# Patient Record
Sex: Male | Born: 1940 | Race: White | Hispanic: No | Marital: Married | Smoking: Never smoker
Health system: Southern US, Community
[De-identification: ages and names within clinical notes are randomized; demographics above are authoritative.]

## PROBLEM LIST (undated history)

## (undated) DIAGNOSIS — T8859XA Other complications of anesthesia, initial encounter: Secondary | ICD-10-CM

## (undated) DIAGNOSIS — I2699 Other pulmonary embolism without acute cor pulmonale: Secondary | ICD-10-CM

## (undated) DIAGNOSIS — R42 Dizziness and giddiness: Secondary | ICD-10-CM

## (undated) DIAGNOSIS — Z87442 Personal history of urinary calculi: Secondary | ICD-10-CM

## (undated) DIAGNOSIS — R519 Headache, unspecified: Secondary | ICD-10-CM

## (undated) DIAGNOSIS — E119 Type 2 diabetes mellitus without complications: Secondary | ICD-10-CM

## (undated) DIAGNOSIS — M1990 Unspecified osteoarthritis, unspecified site: Secondary | ICD-10-CM

## (undated) DIAGNOSIS — F32A Depression, unspecified: Secondary | ICD-10-CM

## (undated) HISTORY — PX: FOOT SURGERY: SHX648

## (undated) HISTORY — PX: ADENOIDECTOMY: SUR15

## (undated) HISTORY — PX: SHOULDER SURGERY: SHX246

## (undated) HISTORY — PX: TONSILLECTOMY: SUR1361

## (undated) HISTORY — PX: FINGER SURGERY: SHX640

## (undated) HISTORY — PX: LITHOTRIPSY: SUR834

## (undated) HISTORY — PX: OTHER SURGICAL HISTORY: SHX169

---

## 2020-04-24 ENCOUNTER — Other Ambulatory Visit: Payer: Self-pay

## 2020-04-24 ENCOUNTER — Encounter: Payer: Self-pay | Admitting: Nurse Practitioner

## 2020-04-24 ENCOUNTER — Non-Acute Institutional Stay: Payer: PRIVATE HEALTH INSURANCE | Admitting: Nurse Practitioner

## 2020-04-24 DIAGNOSIS — E119 Type 2 diabetes mellitus without complications: Secondary | ICD-10-CM

## 2020-04-24 DIAGNOSIS — Z981 Arthrodesis status: Secondary | ICD-10-CM

## 2020-04-24 DIAGNOSIS — E782 Mixed hyperlipidemia: Secondary | ICD-10-CM

## 2020-04-24 DIAGNOSIS — Z86711 Personal history of pulmonary embolism: Secondary | ICD-10-CM

## 2020-04-24 DIAGNOSIS — N400 Enlarged prostate without lower urinary tract symptoms: Secondary | ICD-10-CM | POA: Insufficient documentation

## 2020-04-24 DIAGNOSIS — E1140 Type 2 diabetes mellitus with diabetic neuropathy, unspecified: Secondary | ICD-10-CM

## 2020-04-24 DIAGNOSIS — H8113 Benign paroxysmal vertigo, bilateral: Secondary | ICD-10-CM

## 2020-04-24 DIAGNOSIS — Z96611 Presence of right artificial shoulder joint: Secondary | ICD-10-CM

## 2020-04-24 DIAGNOSIS — E785 Hyperlipidemia, unspecified: Secondary | ICD-10-CM | POA: Insufficient documentation

## 2020-04-24 DIAGNOSIS — K5901 Slow transit constipation: Secondary | ICD-10-CM | POA: Diagnosis not present

## 2020-04-24 DIAGNOSIS — J309 Allergic rhinitis, unspecified: Secondary | ICD-10-CM

## 2020-04-24 DIAGNOSIS — N200 Calculus of kidney: Secondary | ICD-10-CM

## 2020-04-24 DIAGNOSIS — Z794 Long term (current) use of insulin: Secondary | ICD-10-CM

## 2020-04-24 MED ORDER — FINASTERIDE 5 MG PO TABS: 5.0000 mg | ORAL_TABLET | Freq: Every day | ORAL | 1 refills | Status: DC

## 2020-04-24 MED ORDER — ATORVASTATIN CALCIUM 40 MG PO TABS: 40.0000 mg | ORAL_TABLET | Freq: Every day | ORAL | 1 refills | Status: DC

## 2020-04-24 MED ORDER — CETIRIZINE HCL 10 MG PO TABS: 10.0000 mg | ORAL_TABLET | Freq: Every day | ORAL | 1 refills | Status: DC

## 2020-04-24 MED ORDER — GABAPENTIN 300 MG PO CAPS: 300.0000 mg | ORAL_CAPSULE | Freq: Three times a day (TID) | ORAL | 1 refills | Status: DC

## 2020-04-24 MED ORDER — DULOXETINE HCL 30 MG PO CPEP: 30.0000 mg | ORAL_CAPSULE | Freq: Two times a day (BID) | ORAL | 1 refills | Status: DC

## 2020-04-24 MED ORDER — TAMSULOSIN HCL 0.4 MG PO CAPS: 0.4000 mg | ORAL_CAPSULE | Freq: Every day | ORAL | 1 refills | Status: DC

## 2020-04-24 MED ORDER — CARBIDOPA-LEVODOPA ER 50-200 MG PO TBCR: 1.0000 | EXTENDED_RELEASE_TABLET | Freq: Two times a day (BID) | ORAL | 1 refills | Status: DC

## 2020-04-24 NOTE — Assessment & Plan Note (Signed)
Uses Zyrtec

## 2020-04-24 NOTE — Assessment & Plan Note (Signed)
Underwent neurology evaluation, not new, resolved after therapy last Nov.

## 2020-04-24 NOTE — Assessment & Plan Note (Addendum)
7 years ago, ambulating okay, on and off pain in both legs, spastic nature,  usually around 3 am, takes Sinemet, Gabapentin.

## 2020-04-24 NOTE — Assessment & Plan Note (Signed)
Occasionally hematuria.

## 2020-04-24 NOTE — Assessment & Plan Note (Signed)
Complicated with PE

## 2020-04-24 NOTE — Assessment & Plan Note (Signed)
Prn MiraLax

## 2020-04-24 NOTE — Assessment & Plan Note (Signed)
Takes Atorvastain

## 2020-04-24 NOTE — Assessment & Plan Note (Signed)
Complicated after the right shoulder replacement, treated with oral anticoagulation therapy.

## 2020-04-24 NOTE — Assessment & Plan Note (Addendum)
Takes Tamsulosin, finasterides, desires PSA

## 2020-04-24 NOTE — Progress Notes (Signed)
Location:   clinic [place]   Place of Service:  Clinic (12) Provider: Marlana Latus NP  Code Status: DNR Goals of Care: No flowsheet data found.   Chief Complaint  Patient presents with  . Establish Care    Patient here today to establish care. New to [place].    HPI: Patient is a 80 y.o. male seen today for medical management of chronic diseases.      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   History reviewed. No pertinent past medical history.  History reviewed. No pertinent surgical history.  Not on File  Allergies as of 04/24/2020   Not on File     Medication List       Accurate as of April 24, 2020 11:59 PM. If you have any questions, ask your nurse or doctor.        atorvastatin 40 MG tablet Commonly known as: LIPITOR Take 1 tablet (40 mg total) by mouth daily.   B-D ULTRAFINE III SHORT PEN 31G X 8 MM Misc Generic drug: Insulin Pen Needle SMARTSIG:1 Each SUB-Q Daily   Basaglar KwikPen 100 UNIT/ML Inject 35 Units into the skin daily.   carbidopa-levodopa 50-200 MG tablet Commonly known as: SINEMET CR Take 1 tablet by mouth 2 (two) times daily.   cetirizine 10 MG tablet Commonly known as: Allergy (Cetirizine) Take 1 tablet (10 mg total) by mouth daily.   DULoxetine 30 MG capsule Commonly known as: CYMBALTA Take 1 capsule  (30 mg total) by mouth 2 (two) times daily.   finasteride 5 MG tablet Commonly known as: PROSCAR Take 1 tablet (5 mg total) by mouth daily.   gabapentin 300 MG capsule Commonly known as: NEURONTIN Take 1 capsule (300 mg total) by mouth 3 (three) times daily.   MULTIPLE MINERALS PO Take 1 tablet by mouth daily.   polyethylene glycol 17 g packet Commonly known as: MIRALAX / GLYCOLAX Take 17 g by mouth daily as needed.   tamsulosin 0.4 MG Caps capsule Commonly known as: FLOMAX Take 1 capsule (0.4 mg total) by mouth daily.       Review of Systems:  Review of Systems  Constitutional: Negative for activity change, fatigue and fever.  HENT: Positive for hearing loss. Negative for congestion, trouble swallowing and voice change.   Eyes: Negative for visual disturbance.  Respiratory: Negative for cough, shortness of breath and wheezing.   Cardiovascular: Negative for chest pain, palpitations and leg swelling.  Gastrointestinal: Negative for abdominal pain, constipation, nausea and vomiting.  Genitourinary: Positive for frequency. Negative for difficulty urinating and urgency.       4x/night  Musculoskeletal: Positive for arthralgias and gait problem.  Skin: Negative for color change.  Neurological: Positive for numbness. Negative for tremors, speech difficulty, weakness and headaches.  Psychiatric/Behavioral: Positive for sleep disturbance. Negative for confusion. The patient is not nervous/anxious.     Health Maintenance  Topic  Date Due  . HEMOGLOBIN A1C  Never done  . Hepatitis C Screening  Never done  . COVID-19 Vaccine (1) Never done  . FOOT EXAM  Never done  . OPHTHALMOLOGY EXAM  Never done  . URINE MICROALBUMIN  Never done  . PNA vac Low Risk Adult (1 of 2 - PCV13) Never done  . TETANUS/TDAP  06/10/2011  . INFLUENZA VACCINE  09/09/2019  . HPV VACCINES  Aged Out    Physical Exam: Vitals:   04/24/20 1506  BP: 136/66  Pulse: 80  Temp: (!) 97.3 F (36.3 C)  SpO2:  97%  Weight: 199 lb 12.8 oz (90.6 kg)  Height: 6' (1.829 m)   Body mass index is 27.1 kg/m. Physical Exam Vitals and nursing note reviewed.  Constitutional:      General: He is not in acute distress.    Appearance: Normal appearance. He is not ill-appearing, toxic-appearing or diaphoretic.  HENT:     Head: Normocephalic and atraumatic.     Nose: Nose normal.     Mouth/Throat:     Mouth: Mucous membranes are moist.  Eyes:     Extraocular Movements: Extraocular movements intact.     Conjunctiva/sclera: Conjunctivae normal.     Pupils: Pupils are equal, round, and reactive to light.  Cardiovascular:     Rate and Rhythm: Normal rate and regular rhythm.     Heart sounds: No murmur heard.     Comments: Hx of PAC Pulmonary:     Breath sounds: No wheezing, rhonchi or rales.  Abdominal:     General: Bowel sounds are normal.     Palpations: Abdomen is soft.     Tenderness: There is no abdominal tenderness. There is no right CVA tenderness, left CVA tenderness, guarding or rebound.  Musculoskeletal:     Cervical back: Normal range of motion and neck supple.     Right lower leg: No edema.     Left lower leg: No edema.     Comments: Right shoulder discomfort with ROM, not strong as the left. Left should discomfort just started.   Skin:    General: Skin is warm and dry.  Neurological:     General: No focal deficit present.     Mental Status: He is alert and oriented to person, place, and time. Mental status is at baseline.  Psychiatric:        Mood and Affect: Mood normal.        Behavior: Behavior normal.        Thought Content: Thought content normal.        Judgment: Judgment normal.     Labs reviewed: Basic Metabolic Panel: No results for input(s): NA, K, CL, CO2, GLUCOSE, BUN, CREATININE, CALCIUM, MG, PHOS, TSH in the last 8760 hours. Liver Function Tests: No results for input(s): AST, ALT, ALKPHOS, BILITOT, PROT, ALBUMIN in the last 8760 hours. No results for input(s):  LIPASE, AMYLASE in the last 8760 hours. No results for input(s): AMMONIA in the last 8760 hours. CBC: No results for input(s): WBC, NEUTROABS, HGB, HCT, MCV, PLT in the last 8760 hours. Lipid Panel: No results for input(s): CHOL, HDL, LDLCALC, TRIG, CHOLHDL, LDLDIRECT in the last 8760 hours. No results found for: HGBA1C  Procedures since last visit: No results found.  Assessment/Plan  Insulin dependent type 2 diabetes mellitus (HCC) Insulin, CBG am and pm  History of lumbar spinal fusion 7 years ago, ambulating okay, on and off pain in both legs, spastic nature,  usually  around 3 am, takes Sinemet, Gabapentin.   BPH (benign prostatic hyperplasia) Takes Tamsulosin, finasterides, desires PSA  Slow transit constipation Prn MiraLax   Right renal stone Occasionally hematuria.   Type 2 diabetes mellitus with diabetic neuropathy, unspecified (HCC) Pain, tingling, numbness in toes, takes Gabapentin, Cymbalta.   Hyperlipidemia Takes Atorvastain  BPPV (benign paroxysmal positional vertigo), bilateral Underwent neurology evaluation, not new, resolved after therapy last Nov.   S/P shoulder replacement, right Complicated with PE  History of pulmonary embolism Complicated after the right shoulder replacement, treated with oral anticoagulation therapy.   Allergic rhinitis Uses Zyrtec   Labs/tests ordered: CBC/diff, CMP/eGFR, TSH, lipid panel, PSA, TSH  Next appt:  4 weeks

## 2020-04-24 NOTE — Assessment & Plan Note (Addendum)
Insulin, CBG am and pm

## 2020-04-24 NOTE — Assessment & Plan Note (Addendum)
Pain, tingling, numbness in toes, takes Gabapentin, Cymbalta.

## 2020-04-25 ENCOUNTER — Encounter: Payer: Self-pay | Admitting: Nurse Practitioner

## 2020-05-01 ENCOUNTER — Other Ambulatory Visit: Payer: Self-pay

## 2020-05-01 DIAGNOSIS — N400 Enlarged prostate without lower urinary tract symptoms: Secondary | ICD-10-CM

## 2020-05-01 DIAGNOSIS — E782 Mixed hyperlipidemia: Secondary | ICD-10-CM

## 2020-05-01 DIAGNOSIS — E119 Type 2 diabetes mellitus without complications: Secondary | ICD-10-CM

## 2020-05-02 LAB — COMPLETE METABOLIC PANEL WITH GFR
AG Ratio: 1.8 (calc) (ref 1.0–2.5)
ALT: 16 U/L (ref 9–46)
AST: 19 U/L (ref 10–35)
Albumin: 4.2 g/dL (ref 3.6–5.1)
Alkaline phosphatase (APISO): 78 U/L (ref 35–144)
BUN: 20 mg/dL (ref 7–25)
CO2: 27 mmol/L (ref 20–32)
Calcium: 9.1 mg/dL (ref 8.6–10.3)
Chloride: 105 mmol/L (ref 98–110)
Creat: 0.84 mg/dL (ref 0.70–1.18)
GFR, Est African American: 97 mL/min/{1.73_m2} (ref >=60)
GFR, Est Non African American: 84 mL/min/{1.73_m2} (ref >=60)
Globulin: 2.4 g/dL (calc) (ref 1.9–3.7)
Glucose, Bld: 92 mg/dL (ref 65–99)
Potassium: 4.4 mmol/L (ref 3.5–5.3)
Sodium: 139 mmol/L (ref 135–146)
Total Bilirubin: 0.7 mg/dL (ref 0.2–1.2)
Total Protein: 6.6 g/dL (ref 6.1–8.1)

## 2020-05-02 LAB — CBC WITH DIFFERENTIAL/PLATELET
Absolute Monocytes: 526 cells/uL (ref 200–950)
Basophils Absolute: 28 cells/uL (ref 0–200)
Basophils Relative: 0.6 %
Eosinophils Absolute: 132 cells/uL (ref 15–500)
Eosinophils Relative: 2.8 %
HCT: 44.8 % (ref 38.5–50.0)
Hemoglobin: 15.1 g/dL (ref 13.2–17.1)
Lymphs Abs: 1222 cells/uL (ref 850–3900)
MCH: 31.7 pg (ref 27.0–33.0)
MCHC: 33.7 g/dL (ref 32.0–36.0)
MCV: 93.9 fL (ref 80.0–100.0)
MPV: 10.1 fL (ref 7.5–12.5)
Monocytes Relative: 11.2 %
Neutro Abs: 2792 cells/uL (ref 1500–7800)
Neutrophils Relative %: 59.4 %
Platelets: 237 10*3/uL (ref 140–400)
RBC: 4.77 10*6/uL (ref 4.20–5.80)
RDW: 12.4 % (ref 11.0–15.0)
Total Lymphocyte: 26 %
WBC: 4.7 10*3/uL (ref 3.8–10.8)

## 2020-05-02 LAB — PSA: PSA: 2.69 ng/mL (ref <=4.0)

## 2020-05-02 LAB — LIPID PANEL
Cholesterol: 116 mg/dL (ref <200)
HDL: 43 mg/dL (ref >=40)
LDL Cholesterol (Calc): 57 mg/dL (calc)
Non-HDL Cholesterol (Calc): 73 mg/dL (calc) (ref <130)
Total CHOL/HDL Ratio: 2.7 (calc) (ref <5.0)
Triglycerides: 78 mg/dL (ref <150)

## 2020-05-02 LAB — HEMOGLOBIN A1C
Hgb A1c MFr Bld: 6 % of total Hgb — ABNORMAL HIGH (ref <5.7)
Mean Plasma Glucose: 126 mg/dL
eAG (mmol/L): 7 mmol/L

## 2020-05-02 LAB — TSH: TSH: 1.42 mIU/L (ref 0.40–4.50)

## 2020-05-28 ENCOUNTER — Encounter: Payer: Self-pay | Admitting: Family Medicine

## 2020-05-28 ENCOUNTER — Other Ambulatory Visit: Payer: Self-pay

## 2020-05-28 ENCOUNTER — Non-Acute Institutional Stay: Payer: Medicare Other | Admitting: Family Medicine

## 2020-05-28 VITALS — BP 118/60 | HR 71 | Temp 96.9°F | Resp 16 | Ht 72.0 in | Wt 201.2 lb

## 2020-05-28 DIAGNOSIS — Z794 Long term (current) use of insulin: Secondary | ICD-10-CM | POA: Diagnosis not present

## 2020-05-28 DIAGNOSIS — N400 Enlarged prostate without lower urinary tract symptoms: Secondary | ICD-10-CM

## 2020-05-28 DIAGNOSIS — E119 Type 2 diabetes mellitus without complications: Secondary | ICD-10-CM

## 2020-05-28 NOTE — Patient Instructions (Signed)
How to Perform the Epley Maneuver The Epley maneuver is an exercise that relieves symptoms of vertigo. Vertigo is the feeling that you or your surroundings are moving when they are not. When you feel vertigo, you may feel like the room is spinning and may have trouble walking. The Epley maneuver is used for a type of vertigo caused by a calcium deposit in a part of the inner ear. The maneuver involves changing head positions to help the deposit move out of the area. You can do this maneuver at home whenever you have symptoms of vertigo. You can repeat it in 24 hours if your vertigo has not gone away. Even though the Epley maneuver may relieve your vertigo for a few weeks, it is possible that your symptoms will return. This maneuver relieves vertigo, but it does not relieve dizziness. What are the risks? If it is done correctly, the Epley maneuver is considered safe. Sometimes it can lead to dizziness or nausea that goes away after a short time. If you develop other symptoms--such as changes in vision, weakness, or numbness--stop doing the maneuver and call your health care provider. Supplies needed:  A bed or table.  A pillow. How to do the Epley maneuver 1. Sit on the edge of a bed or table with your back straight and your legs extended or hanging over the edge of the bed or table. 2. Turn your head halfway toward the affected ear or side as told by your health care provider. 3. Lie backward quickly with your head turned until you are lying flat on your back. You may want to position a pillow under your shoulders. 4. Hold this position for at least 30 seconds. If you feel dizzy or have symptoms of vertigo, continue to hold the position until the symptoms stop. 5. Turn your head to the opposite direction until your unaffected ear is facing the floor. 6. Hold this position for at least 30 seconds. If you feel dizzy or have symptoms of vertigo, continue to hold the position until the symptoms  stop. 7. Turn your whole body to the same side as your head so that you are positioned on your side. Your head will now be nearly facedown. Hold for at least 30 seconds. If you feel dizzy or have symptoms of vertigo, continue to hold the position until the symptoms stop. 8. Sit back up. You can repeat the maneuver in 24 hours if your vertigo does not go away.      Follow these instructions at home: For 24 hours after doing the Epley maneuver:  Keep your head in an upright position.  When lying down to sleep or rest, keep your head raised (elevated) with two or more pillows.  Avoid excessive neck movements. Activity  Do not drive or use machinery if you feel dizzy.  After doing the Epley maneuver, return to your normal activities as told by your health care provider. Ask your health care provider what activities are safe for you. General instructions  Drink enough fluid to keep your urine pale yellow.  Do not drink alcohol.  Take over-the-counter and prescription medicines only as told by your health care provider.  Keep all follow-up visits as told by your health care provider. This is important. Preventing vertigo symptoms Ask your health care provider if there is anything you should do at home to prevent vertigo. He or she may recommend that you:  Keep your head elevated with two or more pillows while you sleep.    Do not sleep on the side of your affected ear.  Get up slowly from bed.  Avoid sudden movements during the day.  Avoid extreme head positions or movement, such as looking up or bending over. Contact a health care provider if:  Your vertigo gets worse.  You have other symptoms, including: ? Nausea. ? Vomiting. ? Headache. Get help right away if you:  Have vision changes.  Have a headache or neck pain that is severe or getting worse.  Cannot stop vomiting.  Have new numbness or weakness in any part of your body. Summary  Vertigo is the feeling that  you or your surroundings are moving when they are not.  The Epley maneuver is an exercise that relieves symptoms of vertigo.  If the Epley maneuver is done correctly, it is considered safe and relieves vertigo quickly. This information is not intended to replace advice given to you by your health care provider. Make sure you discuss any questions you have with your health care provider. Document Revised: 11/22/2018 Document Reviewed: 11/22/2018 Elsevier Patient Education  2021 [employer] Inc.  

## 2020-05-28 NOTE — Progress Notes (Signed)
Provider:  Jacalyn Lefevre, MD  Careteam: Patient Care Team: Frederica Kuster, MD as PCP - General (Family Medicine)  PLACE OF SERVICE:  [facility]  Advanced Directive information Does Patient Have a Medical Advance Directive?: Yes, Type of Advance Directive: Healthcare Power of Union;Living will, Does patient want to make changes to medical advance directive?: No - Patient declined  No Known Allergies  Chief Complaint  Patient presents with  . Medical Management of Chronic Issues    4 week follow up  . Health Maintenance    Discuss the need for Hepatitis C Screening, Foot exam, Eye exam, and Urine Microalbumin.  . Immunizations    Discuss the need for Tetanus Vaccine, and PNA Vaccine.     HPI: Patient is a 80 y.o. male moved to this area about 1 month ago to be near side in [place].  He is insulin-dependent diabetic but takes only basal insulin 35 units at night.  He monitors his sugars and generally they have been very good and in fact last A1c 1 month ago was 6.0.  He also has hyperlipidemia and takes atorvastatin 40 mg.  He has some cramping in his legs and was prescribed Sinemet he takes twice a day that seems to take care of that problem.  There is also a history of BPH and he uses Flomax and Proscar.  He has no specific complaints today but would like referral to urology or prostate follow-up.  Review of Systems:  Review of Systems  All other systems reviewed and are negative.   No past medical history on file. No past surgical history on file. Social History:   reports that he has never smoked. He has never used smokeless tobacco. He reports previous alcohol use. No history on file for drug use.  No family history on file.  Medications: Patient's Medications  New Prescriptions   No medications on file  Previous Medications   ATORVASTATIN (LIPITOR) 40 MG TABLET    Take 1 tablet (40 mg total) by mouth daily.   B-D ULTRAFINE III SHORT PEN 31G X 8 MM  MISC    SMARTSIG:1 Each SUB-Q Daily   CARBIDOPA-LEVODOPA (SINEMET CR) 50-200 MG TABLET    Take 1 tablet by mouth 2 (two) times daily.   CETIRIZINE (ALLERGY, CETIRIZINE,) 10 MG TABLET    Take 1 tablet (10 mg total) by mouth daily.   DULOXETINE (CYMBALTA) 30 MG CAPSULE    Take 1 capsule (30 mg total) by mouth 2 (two) times daily.   FINASTERIDE (PROSCAR) 5 MG TABLET    Take 1 tablet (5 mg total) by mouth daily.   GABAPENTIN (NEURONTIN) 300 MG CAPSULE    Take 1 capsule (300 mg total) by mouth 3 (three) times daily.   INSULIN GLARGINE (BASAGLAR KWIKPEN) 100 UNIT/ML    Inject 35 Units into the skin daily.   MULTIPLE MINERALS PO    Take 1 tablet by mouth daily.   POLYETHYLENE GLYCOL (MIRALAX / GLYCOLAX) 17 G PACKET    Take 17 g by mouth daily as needed.   TAMSULOSIN (FLOMAX) 0.4 MG CAPS CAPSULE    Take 1 capsule (0.4 mg total) by mouth daily.  Modified Medications   No medications on file  Discontinued Medications   No medications on file    Physical Exam:  Vitals:   05/28/20 1406  BP: 118/60  Pulse: 71  Resp: 16  Temp: (!) 96.9 F (36.1 C)  SpO2: 94%  Weight: 201 lb 3.2 oz (  91.3 kg)  Height: 6' (1.829 m)   Body mass index is 27.29 kg/m. Wt Readings from Last 3 Encounters:  05/28/20 201 lb 3.2 oz (91.3 kg)  04/24/20 199 lb 12.8 oz (90.6 kg)    Physical Exam Vitals and nursing note reviewed.  Constitutional:      Appearance: Normal appearance.  HENT:     Right Ear: Tympanic membrane normal.     Left Ear: Tympanic membrane normal.  Eyes:     Extraocular Movements: Extraocular movements intact.     Pupils: Pupils are equal, round, and reactive to light.     Comments: Had eye exam in August 2021  Cardiovascular:     Rate and Rhythm: Normal rate and regular rhythm.  Pulmonary:     Effort: Pulmonary effort is normal.     Breath sounds: Normal breath sounds.  Abdominal:     General: Abdomen is flat.     Palpations: Abdomen is soft.  Musculoskeletal:        General: Normal  range of motion.  Skin:    General: Skin is warm and dry.  Neurological:     General: No focal deficit present.     Mental Status: He is alert and oriented to person, place, and time.  Psychiatric:        Mood and Affect: Mood normal.        Behavior: Behavior normal.        Thought Content: Thought content normal.     Labs reviewed: Basic Metabolic Panel: Recent Labs    05/01/20 0705  NA 139  K 4.4  CL 105  CO2 27  GLUCOSE 92  BUN 20  CREATININE 0.84  CALCIUM 9.1  TSH 1.42   Liver Function Tests: Recent Labs    05/01/20 0705  AST 19  ALT 16  BILITOT 0.7  PROT 6.6   No results for input(s): LIPASE, AMYLASE in the last 8760 hours. No results for input(s): AMMONIA in the last 8760 hours. CBC: Recent Labs    05/01/20 0705  WBC 4.7  NEUTROABS 2,792  HGB 15.1  HCT 44.8  MCV 93.9  PLT 237   Lipid Panel: Recent Labs    05/01/20 0705  CHOL 116  HDL 43  LDLCALC 57  TRIG 78  CHOLHDL 2.7   TSH: Recent Labs    05/01/20 0705  TSH 1.42   A1C: Lab Results  Component Value Date   HGBA1C 6.0 (H) 05/01/2020     Assessment/Plan  1. Benign prostatic hyperplasia without lower urinary tract symptoms Did not do exam today since he desires to be seen by urology.  Continue Proscar and Flomax as before - Ambulatory referral to Urology  2. Insulin dependent type 2 diabetes mellitus (HCC) He is doing very well on 1 injection of basal insulin per day.  See no reason to change settings - Microalbumin / creatinine urine ratio; Future   Jacalyn Lefevre, MD [facility] [phone]

## 2020-05-29 ENCOUNTER — Other Ambulatory Visit: Payer: Self-pay

## 2020-05-29 DIAGNOSIS — Z794 Long term (current) use of insulin: Secondary | ICD-10-CM

## 2020-05-29 DIAGNOSIS — E119 Type 2 diabetes mellitus without complications: Secondary | ICD-10-CM

## 2020-05-30 ENCOUNTER — Encounter: Payer: Self-pay | Admitting: Internal Medicine

## 2020-05-30 LAB — MICROALBUMIN / CREATININE URINE RATIO
Creatinine, Urine: 99 mg/dL (ref 20–320)
Microalb Creat Ratio: 6 mcg/mg creat (ref <30)
Microalb, Ur: 0.6 mg/dL

## 2020-07-21 ENCOUNTER — Encounter (HOSPITAL_BASED_OUTPATIENT_CLINIC_OR_DEPARTMENT_OTHER): Payer: Self-pay | Admitting: *Deleted

## 2020-07-21 ENCOUNTER — Emergency Department (HOSPITAL_BASED_OUTPATIENT_CLINIC_OR_DEPARTMENT_OTHER)
Admission: EM | Admit: 2020-07-21 | Discharge: 2020-07-21 | Disposition: A | Discharge status: Home / Self Care | Payer: Medicare Other | Attending: Emergency Medicine | Admitting: Emergency Medicine

## 2020-07-21 ENCOUNTER — Other Ambulatory Visit: Payer: Self-pay

## 2020-07-21 DIAGNOSIS — E1140 Type 2 diabetes mellitus with diabetic neuropathy, unspecified: Secondary | ICD-10-CM | POA: Insufficient documentation

## 2020-07-21 DIAGNOSIS — R319 Hematuria, unspecified: Secondary | ICD-10-CM | POA: Diagnosis present

## 2020-07-21 DIAGNOSIS — R310 Gross hematuria: Secondary | ICD-10-CM

## 2020-07-21 DIAGNOSIS — N200 Calculus of kidney: Secondary | ICD-10-CM | POA: Diagnosis not present

## 2020-07-21 DIAGNOSIS — Z794 Long term (current) use of insulin: Secondary | ICD-10-CM | POA: Insufficient documentation

## 2020-07-21 HISTORY — DX: Unspecified osteoarthritis, unspecified site: M19.90

## 2020-07-21 HISTORY — DX: Type 2 diabetes mellitus without complications: E11.9

## 2020-07-21 LAB — URINALYSIS, ROUTINE W REFLEX MICROSCOPIC
Bilirubin Urine: NEGATIVE
Glucose, UA: NEGATIVE mg/dL
Hgb urine dipstick: NEGATIVE
Ketones, ur: NEGATIVE mg/dL
Leukocytes,Ua: NEGATIVE
Nitrite: NEGATIVE
Protein, ur: NEGATIVE mg/dL
Specific Gravity, Urine: 1.023 (ref 1.005–1.030)
pH: 6.5 (ref 5.0–8.0)

## 2020-07-21 NOTE — Discharge Instructions (Addendum)
You are seen in the emergency department for painless hematuria.  You had lab work and a CAT scan done at [facility] that we were able to review.  Your urine today did not show any blood in it.  Please contact [facility] for close follow-up.  If you feel like you were in retention and cannot urinate please return to the emergency department

## 2020-07-21 NOTE — ED Triage Notes (Signed)
Bloody urine started really bad yesterday afternoon.

## 2020-07-21 NOTE — ED Notes (Signed)
Patient drinking fluids to try and provide urine sample. Patient had a bladder scan done that showed .

## 2020-07-21 NOTE — ED Provider Notes (Signed)
[facility] EMERGENCY DEPT Provider Note   CSN: [id number] Arrival date & time: 07/21/20  1020     History Chief Complaint  Patient presents with   Hematuria    Stephen Burns is a 80 y.o. male.  He is here with complaint of hematuria.  He has had kidney stones before and required lithotripsy.  He has had on and off hematuria since.  Yesterday he had 2 episodes of frank hematuria followed by clear urine.  Not associate with any pain or fever.  He went to [facility] and waited there for many hours, had lab work and a CAT scan done but ultimately was not seen.  His urine was clear this morning and then a few hours later when he urinated it was frank blood again.  No trauma.  No blood thinners.  Does not have a local urologist although is waiting to get an appointment.  The history is provided by the patient.  Hematuria This is a recurrent problem. The current episode started yesterday. The problem has not changed since onset.Pertinent negatives include no chest pain, no abdominal pain, no headaches and no shortness of breath. Nothing aggravates the symptoms. Nothing relieves the symptoms. He has tried nothing for the symptoms. The treatment provided no relief.      Past Medical History:  Diagnosis Date   Arthritis    Diabetes mellitus without complication [facility])     Patient Active Problem List   Diagnosis Date Noted   Insulin dependent type 2 diabetes mellitus (HCC) 04/24/2020   History of lumbar spinal fusion 04/24/2020   BPH (benign prostatic hyperplasia) 04/24/2020   Slow transit constipation 04/24/2020   Right renal stone 04/24/2020   Type 2 diabetes mellitus with diabetic neuropathy, unspecified (HCC) 04/24/2020   Hyperlipidemia 04/24/2020   BPPV (benign paroxysmal positional vertigo), bilateral 04/24/2020   S/P shoulder replacement, 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 No pertinent family history.  Social History   Tobacco Use   Smoking status: Never   Smokeless tobacco: Never  Vaping Use   Vaping Use: Never used  Substance Use Topics   Alcohol use: Not Currently   Drug use: Never    Home Medications Prior to Admission medications   Medication Sig Start Date End Date Taking? Authorizing Provider  atorvastatin (LIPITOR) 40 MG tablet Take 1 tablet (40 mg total) by mouth daily. 04/24/20  Yes Mast, Man X, NP  carbidopa-levodopa (SINEMET CR) 50-200 MG tablet Take 1 tablet by mouth 2 (two) times daily. 04/24/20  Yes Mast, Man X, NP  cetirizine (ALLERGY, CETIRIZINE,) 10 MG tablet Take 1 tablet (10 mg total) by mouth daily. 04/24/20  Yes Mast, Man X, NP  DULoxetine (CYMBALTA) 30 MG capsule Take 1 capsule (30 mg total) by mouth 2 (two) times daily. 04/24/20  Yes Mast, Man X, NP  finasteride (PROSCAR) 5 MG tablet Take 1 tablet (5 mg total) by mouth daily. 04/24/20  Yes Mast, Man X, NP  gabapentin (NEURONTIN) 300 MG capsule Take 1  capsule (300 mg total) by mouth 3 (three) times daily. 04/24/20  Yes Mast, Man X, NP  Insulin Glargine (BASAGLAR KWIKPEN) 100 UNIT/ML Inject 35 Units into the skin daily. 04/10/20  Yes [provider]  MULTIPLE MINERALS PO Take 1 tablet by mouth daily.   Yes [provider]  polyethylene glycol (MIRALAX / GLYCOLAX) 17 g packet Take 17 g by mouth daily as needed.   Yes [provider]  tamsulosin (FLOMAX) 0.4 MG CAPS capsule Take 1 capsule (0.4 mg total) by mouth daily. 04/24/20  Yes Mast, Man X, NP  B-D ULTRAFINE III SHORT PEN 31G X 8 MM MISC SMARTSIG:1 Each SUB-Q Daily 01/18/20   [provider]    Allergies    Sulfa  antibiotics  Review of Systems   Review of Systems  Constitutional:  Negative for fever.  HENT:  Negative for sore throat.   Eyes:  Negative for visual disturbance.  Respiratory:  Negative for shortness of breath.   Cardiovascular:  Negative for chest pain.  Gastrointestinal:  Negative for abdominal pain.  Genitourinary:  Positive for hematuria. Negative for dysuria.  Musculoskeletal:  Negative for back pain.  Skin:  Negative for rash.  Neurological:  Negative for headaches.   Physical Exam Updated Vital Signs BP 122/78 (BP Location: Right Arm)   Pulse 82   Temp (!) 96.9 F (36.1 C) (Oral)   Resp 16   Ht 6' (1.829 m)   Wt 88.5 kg   SpO2 100%   BMI 26.45 kg/m   Physical Exam Vitals and nursing note reviewed.  Constitutional:      Appearance: Normal appearance. He is well-developed.  HENT:     Head: Normocephalic and atraumatic.  Eyes:     Conjunctiva/sclera: Conjunctivae normal.  Cardiovascular:     Rate and Rhythm: Normal rate and regular rhythm.     Heart sounds: No murmur heard. Pulmonary:     Effort: Pulmonary effort is normal. No respiratory distress.     Breath sounds: Normal breath sounds.  Abdominal:     Palpations: Abdomen is soft.     Tenderness: There is no abdominal tenderness. There is no guarding or rebound.  Musculoskeletal:        General: No deformity or signs of injury. Normal range of motion.     Cervical back: Neck supple.  Skin:    General: Skin is warm and dry.  Neurological:     General: No focal deficit present.     Mental Status: He is alert.    ED Results / Procedures / Treatments   Labs (all labs ordered are listed, but only abnormal results are displayed) Labs Reviewed  URINE CULTURE  URINALYSIS, ROUTINE W REFLEX MICROSCOPIC    EKG None  Radiology No results found.  Procedures Procedures   Medications Ordered in ED Medications - No data to display  ED Course  I have reviewed the triage vital signs and the nursing  notes.  Pertinent labs & imaging results that were available during my care of the patient were reviewed by me and considered in my medical decision making (see chart for details).  Clinical Course as of 07/21/20 1649  Mon Jul 21, 2020  1106 I was able to see the labs from [facility] from yesterday.  He had a normal CBC with a hemoglobin of 14.  Chemistries with normal renal function.  Normal LFTs.  He had a noncontrast CT that showed a nonobstructing 3 mm stone in the right kidney,  bladder distended with fluid, prostatomegaly [MB]  1314 Discussed with Dr. Liliane Shi.  He said to have him call the office and they will get him in pretty quickly to take a look at him. [MB]    Clinical Course User Index [MB] Terrilee Files, MD   MDM Rules/Calculators/A&P                         This patient complains of painless hematuria; this involves an extensive number of treatment Options and is a complaint that carries with it a high risk of complications and Morbidity. The differential includes infection, obstruction, bladder stone, ureteral stone, nephritic syndrome  I reviewed and interpreted labs, which included CBC chemistries which were normal at [facility] yesterday I also read the report of the CT renal study which showed stones in the right kidney, no ureterolithiasis, does have some bladder distention question element of retention Additional history obtained from patient's wife Previous records obtained and reviewed in epic, no recent visits I consulted Dr. Liliane [facility] and discussed lab and imaging findings  Critical Interventions: None  After the interventions stated above, I reevaluated the patient and found patient was able to ambulate here.  Urinalysis unremarkable here.  Will send for culture.  Per Dr. Liliane Shi patient is to call the office and schedule outpatient follow-up.  Return instructions discussed.   Final Clinical Impression(s) / ED Diagnoses Final diagnoses:  Gross  hematuria    Rx / DC Orders ED Discharge Orders     None        Terrilee Files, MD 07/21/20 1652

## 2020-07-22 LAB — URINE CULTURE: Culture: NO GROWTH

## 2020-08-04 ENCOUNTER — Other Ambulatory Visit: Payer: Self-pay | Admitting: Urology

## 2020-08-18 NOTE — Progress Notes (Addendum)
COVID Vaccine Completed:  Yes x4 Date COVID Vaccine completed: 03-14-19 04-03-19 Has received booster: 11-05-19, Booster x2 COVID vaccine manufacturer: Pfizer     Date of COVID positive in last 90 days: N/A  PCP - Jacalyn Lefevre, MD Cardiologist - 10 years ago for job physical  Chest x-ray - N/A EKG - 08-25-20 Epic Stress Test - Yes for job physical ECHO - N/a Cardiac Cath - N/A Pacemaker/ICD device last checked: Spinal Cord Stimulator:  Sleep Study - Yes, neg sleep apnea CPAP - No  Fasting Blood Sugar - 90 to 110 Checks Blood Sugar 2 times a day  Blood Thinner Instructions:  No Aspirin Instructions: Last Dose:  Activity level:  Can go up a flight of stairs and perform activities of daily living without stopping and without symptoms of chest pain or shortness of breath.      Anesthesia review:  N/A  Patient denies shortness of breath, fever, cough and chest pain at PAT appointment   Patient verbalized understanding of instructions that were given to them at the PAT appointment. Patient was also instructed that they will need to review over the PAT instructions again at home before surgery.

## 2020-08-18 NOTE — Patient Instructions (Addendum)
DUE TO COVID-19 ONLY ONE VISITOR IS ALLOWED TO COME WITH YOU AND STAY IN THE WAITING ROOM ONLY DURING PRE OP AND PROCEDURE.   **NO VISITORS ARE ALLOWED IN THE SHORT STAY AREA OR RECOVERY ROOM!!**  IF YOU WILL BE ADMITTED INTO THE HOSPITAL YOU ARE ALLOWED ONLY TWO SUPPORT PEOPLE DURING VISITATION HOURS ONLY (10AM -8PM)   The support person(s) may change daily. The support person(s) must pass our screening, gel in and out, and wear a mask at all times, including in the patient's room. Patients must also wear a mask when staff or their support person are in the room.  No visitors under the age of 35. Any visitor under the age of 81 must be accompanied by an adult.    COVID SWAB TESTING MUST BE COMPLETED ON:   Friday, 08-29-20 @ 2:00 PM   [address] [place], [place] [address]   You are not required to quarantine, however you are required to wear a well-fitted mask when you are out and around people not in your household.  Hand Hygiene often Do NOT share personal items Notify your provider if you are in close contact with someone who has COVID or you develop fever 100.4 or greater, new onset of sneezing, cough, sore throat, shortness of breath or body aches.         Your procedure is scheduled on:  Tuesday, 09-02-20   Report to [facility] Main  Entrance    Report to admitting at 7:20 AM   Call this number if you have problems the morning of surgery [phone]   Do not eat food :After Midnight.   May have liquids until 6:20 AM day of surgery  CLEAR LIQUID DIET  Foods Allowed                                                                     Foods Excluded  Water, Black Coffee and tea, regular and decaf                liquids that you cannot  Plain Jell-O in any flavor  (No red)                                       see through such as: Fruit ices (not with fruit pulp)                                      milk, soups, orange juice              Iced Popsicles (No red)                                       All solid food                                   Apple juices Sports drinks like Gatorade (No red) Lightly seasoned clear broth  or consume(fat free) Sugar, honey syrup   Oral Hygiene is also important to reduce your risk of infection.                                    Remember - BRUSH YOUR TEETH THE MORNING OF SURGERY WITH YOUR REGULAR TOOTHPASTE   Do NOT smoke after Midnight  Take these medicines the morning of surgery with A SIP OF WATER:  Cetirizine, Carbidopa-Levodopa, Finasteride, Gabapentin  How to Manage Your Diabetes Before and After Surgery  Why is it important to control my blood sugar before and after surgery? Improving blood sugar levels before and after surgery helps healing and can limit problems. A way of improving blood sugar control is eating a healthy diet by:  Eating less sugar and carbohydrates  Increasing activity/exercise  Talking with your doctor about reaching your blood sugar goals High blood sugars (greater than 180 mg/dL) can raise your risk of infections and slow your recovery, so you will need to focus on controlling your diabetes during the weeks before surgery. Make sure that the doctor who takes care of your diabetes knows about your planned surgery including the date and location.  How do I manage my blood sugar before surgery? Check your blood sugar at least 4 times a day, starting 2 days before surgery, to make sure that the level is not too high or low. Check your blood sugar the morning of your surgery when you wake up and every 2 hours until you get to the Short Stay unit. If your blood sugar is less than 70 mg/dL, you will need to treat for low blood sugar: Do not take insulin. Treat a low blood sugar (less than 70 mg/dL) with  cup of clear juice (cranberry or apple), 4 glucose tablets, OR glucose gel. Recheck blood sugar in 15 minutes after treatment (to make sure it is greater than 70 mg/dL). If your blood  sugar is not greater than 70 mg/dL on recheck, call [phone] for further instructions. Report your blood sugar to the short stay nurse when you get to Short Stay.  If you are admitted to the hospital after surgery: Your blood sugar will be checked by the staff and you will probably be given insulin after surgery (instead of oral diabetes medicines) to make sure you have good blood sugar levels. The goal for blood sugar control after surgery is 80-180 mg/dL.   WHAT DO I DO ABOUT MY DIABETES MEDICATION?  Do not take oral diabetes medicines (pills) the morning of surgery.  THE DAY BEFORE SURGERY:  Take Insulin Glargine as prescribed.       THE MORNING OF SURGERY:  Take 17 units of Insulin Glargine.   Reviewed and Endorsed by [facility] Patient Education Committee, August 2015                               You may not have any metal on your body including jewelry, and body piercing             Do not wear  lotions, powders, cologne, or deodorant              Men may shave face and neck.   Do not bring valuables to the hospital. [place] [facility] IS NOT RESPONSIBLE   FOR VALUABLES.   Contacts, dentures or bridgework may not  be worn into surgery.   Bring small overnight bag day of surgery.   Special Instructions: Bring a copy of your healthcare power of attorney and living will documents         the day of surgery if you haven't scanned them in before.  Please read over the following fact sheets you were given: IF YOU HAVE QUESTIONS ABOUT YOUR PRE OP INSTRUCTIONS PLEASE CALL [phone] [facility] - Preparing for Surgery Before surgery, you can play an important role.  Because skin is not sterile, your skin needs to be as free of germs as possible.  You can reduce the number of germs on your skin by washing with CHG (chlorahexidine gluconate) soap before surgery.  CHG is an antiseptic cleaner which kills germs and bonds with the skin to continue killing germs even after  washing. Please DO NOT use if you have an allergy to CHG or antibacterial soaps.  If your skin becomes reddened/irritated stop using the CHG and inform your nurse when you arrive at Short Stay. Do not shave (including legs and underarms) for at least 48 hours prior to the first CHG shower.  You may shave your face/neck.  Please follow these instructions carefully:  1.  Shower with CHG Soap the night before surgery and the  morning of surgery.  2.  If you choose to wash your hair, wash your hair first as usual with your normal  shampoo.  3.  After you shampoo, rinse your hair and body thoroughly to remove the shampoo.                             4.  Use CHG as you would any other liquid soap.  You can apply chg directly to the skin and wash.  Gently with a scrungie or clean washcloth.  5.  Apply the CHG Soap to your body ONLY FROM THE NECK DOWN.   Do   not use on face/ open                           Wound or open sores. Avoid contact with eyes, ears mouth and   genitals (private parts).                       Wash face,  Genitals (private parts) with your normal soap.             6.  Wash thoroughly, paying special attention to the area where your    surgery  will be performed.  7.  Thoroughly rinse your body with warm water from the neck down.  8.  DO NOT shower/wash with your normal soap after using and rinsing off the CHG Soap.                9.  Pat yourself dry with a clean towel.            10.  Wear clean pajamas.            11.  Place clean sheets on your bed the night of your first shower and do not  sleep with pets. Day of Surgery : Do not apply any lotions/deodorants the morning of surgery.  Please wear clean clothes to the hospital/surgery center.  FAILURE TO FOLLOW THESE INSTRUCTIONS MAY RESULT IN THE CANCELLATION OF YOUR SURGERY  PATIENT SIGNATURE_________________________________  NURSE  SIGNATURE__________________________________  ________________________________________________________________________

## 2020-08-25 ENCOUNTER — Encounter (HOSPITAL_COMMUNITY)
Admission: RE | Admit: 2020-08-25 | Discharge: 2020-08-25 | Disposition: A | Discharge status: Home / Self Care | Payer: Medicare Other | Source: Ambulatory Visit | Attending: Urology | Admitting: Urology

## 2020-08-25 ENCOUNTER — Other Ambulatory Visit: Payer: Self-pay

## 2020-08-25 ENCOUNTER — Encounter (HOSPITAL_COMMUNITY): Payer: Self-pay

## 2020-08-25 DIAGNOSIS — Z01818 Encounter for other preprocedural examination: Secondary | ICD-10-CM | POA: Diagnosis present

## 2020-08-25 HISTORY — DX: Other pulmonary embolism without acute cor pulmonale: I26.99

## 2020-08-25 HISTORY — DX: Dizziness and giddiness: R42

## 2020-08-25 HISTORY — DX: Other complications of anesthesia, initial encounter: T88.59XA

## 2020-08-25 HISTORY — DX: Headache, unspecified: R51.9

## 2020-08-25 HISTORY — DX: Personal history of urinary calculi: Z87.442

## 2020-08-25 LAB — BASIC METABOLIC PANEL
Anion gap: 6 (ref 5–15)
BUN: 21 mg/dL (ref 8–23)
CO2: 27 mmol/L (ref 22–32)
Calcium: 9.2 mg/dL (ref 8.9–10.3)
Chloride: 107 mmol/L (ref 98–111)
Creatinine, Ser: 0.72 mg/dL (ref 0.61–1.24)
GFR, Estimated: >60 mL/min (ref >60)
Glucose, Bld: 181 mg/dL — ABNORMAL HIGH (ref 70–99)
Potassium: 4.2 mmol/L (ref 3.5–5.1)
Sodium: 140 mmol/L (ref 135–145)

## 2020-08-25 LAB — CBC
HCT: 43.2 % (ref 39.0–52.0)
Hemoglobin: 14.6 g/dL (ref 13.0–17.0)
MCH: 31.8 pg (ref 26.0–34.0)
MCHC: 33.8 g/dL (ref 30.0–36.0)
MCV: 94.1 fL (ref 80.0–100.0)
Platelets: 229 10*3/uL (ref 150–400)
RBC: 4.59 MIL/uL (ref 4.22–5.81)
RDW: 12.9 % (ref 11.5–15.5)
WBC: 5.6 10*3/uL (ref 4.0–10.5)
nRBC: 0 % (ref 0.0–0.2)

## 2020-08-25 LAB — HEMOGLOBIN A1C
Hgb A1c MFr Bld: 6.2 % — ABNORMAL HIGH (ref 4.8–5.6)
Mean Plasma Glucose: 131.24 mg/dL

## 2020-08-29 ENCOUNTER — Other Ambulatory Visit (HOSPITAL_COMMUNITY)
Admission: RE | Admit: 2020-08-29 | Discharge: 2020-08-29 | Disposition: A | Discharge status: Home / Self Care | Payer: Medicare Other | Source: Ambulatory Visit | Attending: Urology | Admitting: Urology

## 2020-08-29 DIAGNOSIS — Z20822 Contact with and (suspected) exposure to covid-19: Secondary | ICD-10-CM | POA: Insufficient documentation

## 2020-08-29 DIAGNOSIS — Z01812 Encounter for preprocedural laboratory examination: Secondary | ICD-10-CM | POA: Diagnosis present

## 2020-08-29 LAB — SARS CORONAVIRUS 2 (TAT 6-24 HRS): SARS Coronavirus 2: NEGATIVE

## 2020-09-01 NOTE — H&P (Signed)
I have blood in my urine.     Stephen Burns is a 80 yo male who was initially seen in the [facility] on 6/12 for gross hematuria. He had a CT that showed a 3 mm non-obstructing right renal stone, a distended bladder and prostate enlargement. He left before seeing the EDP and can to a [facility] ED on 6/13. The hematuria had stopped by then and the UA and culture were negative. He had a very slight amount of blood in the urine today. He had no pain or new voiding complaints. He had a normal CBC and CMP at the ER. His PSA was 2.89 in 3/22. He has had a prior prostate biopsy in [place]. He has a history of BPH and has been on finasteride and tamsulosin for years. He has cooled thermotherapy and possibly rezum about 2 years ago. He doesn't recall that he had a benefit. He has a history of a large stone and had ESWL x 4 and then endoscopic management with a stent in 1994. His IPSS is 12 with nocturia x3 along with frequency and urgency.   08/19/2020: Cystoscopy at last visit revealed an obstructing prostate with a median lobe component. A small ulcer in the left proximal prostate at the bladder neck thought to be the source of the bleeding and is likely secondary to his prior ablative procedure. Treatment options discussed with patient eventually electing to proceed with TURP. He is scheduled for this on 07/26. Here today for preoperative appointment.   Doing well today. He denies any changes to past medical history, prescription medications taken on a daily basis. He has had no interval surgical or procedural intervention. No recent fevers or chills, nausea/vomiting. Voiding symptoms grossly stable as described above, no interval dysuria, gross hematuria, UTI treatment.    ALLERGIES: Sulfa    MEDICATIONS: Finasteride 5 mg tablet  Tamsulosin Hcl 0.4 mg capsule  Atorvastatin Calcium 40 mg tablet  Basaglar Kwikpen U-100  Carbidopa-Levodopa Er 50 mg-200 mg tablet, extended release  Cetirizine Hcl  Duloxetine Hcl  30 mg capsule,delayed release  Gabapentin 300 mg capsule  Multiple Vitamin     GU PSH: Cystoscopy - 07/23/2020       PSH Notes: rt shoulder replacement   NON-GU PSH: Back surgery - 04/24/2020 Foot surgery (unspecified), Left Remove Adenoids Shoulder Surgery (Unspecified), Right Tonsillectomy..     GU PMH: BPH w/LUTS, He has long standing LUTS that have persisted despite two minimally invasive procedure and the ongoing use of finasteride and tamsulosin. - 07/23/2020 Gross hematuria, He has a small ulcer in the left proximal prostate at the bladder neck that appears to be the source of the bleeding and is likely secondary to his prior ablative procedure. I discussed cystoscopy with a limited resection or fulguration vs a TURP vs continued observation. He will ponder the options and let me know. I gave him a TURP handout and reviewed the risks in detail I reviewd the risks of a TURP including bleeding, infection, incontinence, stricture, need for secondary procedures, ejaculatory and erectile dysfunction, thrombotic events, fluid overload and anesthetic complications. I explained that 95% of men will have relief of the obstructive symptoms and about 70% will have relief of the irritative symptoms. - 07/23/2020 Incomplete bladder emptying - 07/23/2020 Weak Urinary Stream - 07/23/2020 Renal calculus    NON-GU PMH: Allergic rhinitis, unspecified Arthritis Diabetes Type 2 Hypercholesterolemia Pulmonary Embolism, History    FAMILY HISTORY: Alzheimer's Disease - Mother Diabetes - Father   SOCIAL HISTORY: Marital  Status: Married Preferred Language: English; Race: White Current Smoking Status: Patient has never smoked.   Tobacco Use Assessment Completed: Used Tobacco in last 30 days? Has never drank.  Drinks 4+ caffeinated drinks per day. Patient's occupation is/was retired.     Notes: 1 son   REVIEW OF SYSTEMS:    GU Review Male:   Patient reports hard to postpone urination and  trouble starting your stream. Patient denies frequent urination, burning/ pain with urination, get up at night to urinate, leakage of urine, stream starts and stops, have to strain to urinate , erection problems, and penile pain.  Gastrointestinal (Upper):   Patient denies nausea, vomiting, and indigestion/ heartburn.  Gastrointestinal (Lower):   Patient reports constipation. Patient denies diarrhea.  Constitutional:   Patient denies fever, night sweats, weight loss, and fatigue.  Skin:   Patient denies skin rash/ lesion and itching.  Eyes:   Patient denies blurred vision and double vision.  Ears/ Nose/ Throat:   Patient reports sinus problems. Patient denies sore throat.  Hematologic/Lymphatic:   Patient denies swollen glands and easy bruising.  Cardiovascular:   Patient denies leg swelling and chest pains.  Respiratory:   Patient reports cough. Patient denies shortness of breath.  Endocrine:   Patient denies excessive thirst.  Musculoskeletal:   Patient reports back pain and joint pain.   Neurological:   Patient reports headaches. Patient denies dizziness.  Psychologic:   Patient denies depression and anxiety.   VITAL SIGNS:      08/19/2020 01:06 PM  Weight 196 lb / 88.9 kg  Height 70 in / 177.8 cm  BP 121/77 mmHg  Pulse 91 /min  Temperature 96.6 F / 35.8 C  BMI 28.1 kg/m   MULTI-SYSTEM PHYSICAL EXAMINATION:    Constitutional: Well-nourished. No physical deformities. Normally developed. Good grooming.  Neck: Neck symmetrical, not swollen. Normal tracheal position.  Respiratory: Normal breath sounds. No labored breathing, no use of accessory muscles.   Cardiovascular: Regular rate and rhythm. No murmur, no gallop.   Skin: No paleness, no jaundice, no cyanosis. No lesion, no ulcer, no rash.  Neurologic / Psychiatric: Oriented to time, oriented to place, oriented to person. No depression, no anxiety, no agitation.  Gastrointestinal: No hernia. No mass, no tenderness, no rigidity, non  obese abdomen.   Musculoskeletal: Normal gait and station of head and neck.     Complexity of Data:  Source Of History:  Patient, Medical Record Summary  Records Review:   Previous Doctor Records, Previous Hospital Records, Previous Patient Records  Urine Test Review:   Urinalysis  Urodynamics Review:   Review Bladder Scan, Review Flow Rate   08/19/20  Urinalysis  Urine Appearance Clear   Urine Color Yellow   Urine Glucose Neg mg/dL  Urine Bilirubin Neg mg/dL  Urine Ketones Neg mg/dL  Urine Specific Gravity 1.025   Urine Blood Neg ery/uL  Urine pH 5.5   Urine Protein Neg mg/dL  Urine Urobilinogen 0.2 mg/dL  Urine Nitrites Neg   Urine Leukocyte Esterase Neg leu/uL   PROCEDURES:          Urinalysis Dipstick Dipstick Cont'd  Color: Yellow Bilirubin: Neg mg/dL  Appearance: Clear Ketones: Neg mg/dL  Specific Gravity: 3.785 Blood: Neg ery/uL  pH: 5.5 Protein: Neg mg/dL  Glucose: Neg mg/dL Urobilinogen: 0.2 mg/dL    Nitrites: Neg    Leukocyte Esterase: Neg leu/uL    ASSESSMENT:      ICD-10 Details  1 GU:   BPH w/LUTS - N40.1 Chronic, Stable  2   Gross hematuria - R31.0 Acute, Complicated Injury, Resolved  3   Incomplete bladder emptying - R39.14 Chronic, Stable  4   Weak Urinary Stream - R39.12 Chronic, Stable   PLAN:           Orders Labs Urine Culture          Schedule Return Visit/Planned Activity: Keep Scheduled Appointment - Follow up MD, Schedule Surgery          Document Letter(s):  Created for Patient: Clinical Summary         Notes:   All questions answered to the best my ability about upcoming procedure and expected postoperative course with understanding expressed by the patient and his family member today. Precautionary urine culture sent today to serve as baseline testing for his upcoming procedure. He will proceed with previously scheduled TURP on 07/26 with his urologist.        Next Appointment:      Next Appointment: 09/02/2020 09:30 AM     Appointment Type: Surgery     Location: [facility] [phone]    Provider: Bjorn Pippin, M.D.    Reason for Visit: OBS WL TURP

## 2020-09-01 NOTE — Anesthesia Preprocedure Evaluation (Addendum)
Anesthesia Evaluation  Patient identified by MRN, date of birth, ID band Patient awake    Reviewed: Allergy & Precautions, NPO status , Patient's Chart, lab work & pertinent test results  History of Anesthesia Complications Negative for: history of anesthetic complications  Airway Mallampati: I  TM Distance: >3 FB Neck ROM: Full    Dental no notable dental hx.    Pulmonary neg pulmonary ROS,    Pulmonary exam normal        Cardiovascular negative cardio ROS Normal cardiovascular exam     Neuro/Psych  Headaches, negative psych ROS   GI/Hepatic negative GI ROS, Neg liver ROS,   Endo/Other  diabetes, Type 2  Renal/GU negative Renal ROS   BPH    Musculoskeletal  (+) Arthritis ,   Abdominal   Peds  Hematology negative hematology ROS (+)   Anesthesia Other Findings Day of surgery medications reviewed with patient.  Reproductive/Obstetrics negative OB ROS                            Anesthesia Physical Anesthesia Plan  ASA: 3  Anesthesia Plan: General   Post-op Pain Management:    Induction: Intravenous  PONV Risk Score and Plan: 2 and Treatment may vary due to age or medical condition, Ondansetron and Dexamethasone  Airway Management Planned: LMA  Additional Equipment: None  Intra-op Plan:   Post-operative Plan: Extubation in OR  Informed Consent: I have reviewed the patients History and Physical, chart, labs and discussed the procedure including the risks, benefits and alternatives for the proposed anesthesia with the patient or authorized representative who has indicated his/her understanding and acceptance.     Dental advisory given  Plan Discussed with: CRNA  Anesthesia Plan Comments:        Anesthesia Quick Evaluation

## 2020-09-02 ENCOUNTER — Observation Stay (HOSPITAL_COMMUNITY)
Admission: RE | Admit: 2020-09-02 | Discharge: 2020-09-03 | Disposition: A | Discharge status: Home / Self Care | Payer: Medicare Other | Attending: Urology | Admitting: Urology

## 2020-09-02 ENCOUNTER — Encounter (HOSPITAL_COMMUNITY): Payer: Self-pay | Admitting: Urology

## 2020-09-02 ENCOUNTER — Encounter (HOSPITAL_COMMUNITY)
Admission: RE | Disposition: A | Discharge status: Home / Self Care | Payer: Self-pay | Source: Home / Self Care | Attending: Urology

## 2020-09-02 ENCOUNTER — Other Ambulatory Visit: Payer: Self-pay

## 2020-09-02 ENCOUNTER — Ambulatory Visit (HOSPITAL_COMMUNITY): Payer: Medicare Other | Admitting: Anesthesiology

## 2020-09-02 DIAGNOSIS — N400 Enlarged prostate without lower urinary tract symptoms: Secondary | ICD-10-CM | POA: Diagnosis present

## 2020-09-02 DIAGNOSIS — R3912 Poor urinary stream: Secondary | ICD-10-CM | POA: Insufficient documentation

## 2020-09-02 DIAGNOSIS — Z79899 Other long term (current) drug therapy: Secondary | ICD-10-CM | POA: Insufficient documentation

## 2020-09-02 DIAGNOSIS — R310 Gross hematuria: Secondary | ICD-10-CM | POA: Diagnosis not present

## 2020-09-02 DIAGNOSIS — N401 Enlarged prostate with lower urinary tract symptoms: Secondary | ICD-10-CM | POA: Diagnosis present

## 2020-09-02 DIAGNOSIS — E119 Type 2 diabetes mellitus without complications: Secondary | ICD-10-CM | POA: Diagnosis not present

## 2020-09-02 DIAGNOSIS — R3914 Feeling of incomplete bladder emptying: Secondary | ICD-10-CM | POA: Insufficient documentation

## 2020-09-02 HISTORY — PX: TRANSURETHRAL RESECTION OF PROSTATE: SHX73

## 2020-09-02 LAB — GLUCOSE, CAPILLARY
Glucose-Capillary: 110 mg/dL — ABNORMAL HIGH (ref 70–99)
Glucose-Capillary: 105 mg/dL — ABNORMAL HIGH (ref 70–99)
Glucose-Capillary: 192 mg/dL — ABNORMAL HIGH (ref 70–99)
Glucose-Capillary: 226 mg/dL — ABNORMAL HIGH (ref 70–99)

## 2020-09-02 LAB — HEMOGLOBIN AND HEMATOCRIT, BLOOD
HCT: 40.7 % (ref 39.0–52.0)
Hemoglobin: 13.6 g/dL (ref 13.0–17.0)

## 2020-09-02 SURGERY — TURP (TRANSURETHRAL RESECTION OF PROSTATE)
Anesthesia: General

## 2020-09-02 MED ORDER — FLUTICASONE PROPIONATE 50 MCG/ACT NA SUSP: 1.0000 | Freq: Every day | NASAL | Status: DC | PRN

## 2020-09-02 MED ORDER — ONDANSETRON HCL 4 MG/2ML IJ SOLN: 4.0000 mg | INTRAMUSCULAR | Status: DC | PRN

## 2020-09-02 MED ORDER — CEFAZOLIN SODIUM-DEXTROSE 1-4 GM/50ML-% IV SOLN
1.0000 g | Freq: Three times a day (TID) | INTRAVENOUS | Status: AC
Start: 2020-09-02 — End: 2020-09-03
  Administered 2020-09-02 – 2020-09-03 (×2): 1 g via INTRAVENOUS
  Filled 2020-09-02 (×2): qty 50

## 2020-09-02 MED ORDER — DOCUSATE SODIUM 100 MG PO CAPS
100.0000 mg | ORAL_CAPSULE | Freq: Two times a day (BID) | ORAL | Status: DC
  Administered 2020-09-02: 100 mg via ORAL
  Filled 2020-09-02 (×3): qty 1

## 2020-09-02 MED ORDER — FENTANYL CITRATE (PF) 100 MCG/2ML IJ SOLN
INTRAMUSCULAR | Status: DC | PRN
  Administered 2020-09-02: 50 ug via INTRAVENOUS

## 2020-09-02 MED ORDER — CARBIDOPA-LEVODOPA ER 50-200 MG PO TBCR
1.0000 | EXTENDED_RELEASE_TABLET | Freq: Two times a day (BID) | ORAL | Status: DC
  Administered 2020-09-02: 1 via ORAL
  Filled 2020-09-02 (×2): qty 1

## 2020-09-02 MED ORDER — OXYCODONE HCL 5 MG/5ML PO SOLN
5.0000 mg | Freq: Once | ORAL | Status: DC | PRN
Start: 2020-09-02 — End: 2020-09-02

## 2020-09-02 MED ORDER — PROMETHAZINE HCL 25 MG/ML IJ SOLN: 6.2500 mg | INTRAMUSCULAR | Status: DC | PRN

## 2020-09-02 MED ORDER — INSULIN ASPART 100 UNIT/ML IJ SOLN
0.0000 [IU] | Freq: Three times a day (TID) | INTRAMUSCULAR | Status: DC
  Administered 2020-09-02: 3 [IU] via SUBCUTANEOUS
  Administered 2020-09-03: 5 [IU] via SUBCUTANEOUS

## 2020-09-02 MED ORDER — LIDOCAINE 2% (20 MG/ML) 5 ML SYRINGE
INTRAMUSCULAR | Status: AC
  Filled 2020-09-02: qty 5

## 2020-09-02 MED ORDER — SODIUM CHLORIDE 0.9 % IR SOLN
Status: DC | PRN
  Administered 2020-09-02: 21000 mL

## 2020-09-02 MED ORDER — ONDANSETRON HCL 4 MG/2ML IJ SOLN
INTRAMUSCULAR | Status: AC
  Filled 2020-09-02: qty 2

## 2020-09-02 MED ORDER — OXYCODONE HCL 5 MG PO TABS: 5.0000 mg | ORAL_TABLET | Freq: Once | ORAL | Status: DC | PRN

## 2020-09-02 MED ORDER — LACTATED RINGERS IV SOLN: INTRAVENOUS | Status: DC

## 2020-09-02 MED ORDER — 0.9 % SODIUM CHLORIDE (POUR BTL) OPTIME
TOPICAL | Status: DC | PRN
  Administered 2020-09-02: 1000 mL

## 2020-09-02 MED ORDER — FENTANYL CITRATE (PF) 100 MCG/2ML IJ SOLN
INTRAMUSCULAR | Status: AC
  Filled 2020-09-02: qty 2

## 2020-09-02 MED ORDER — TAMSULOSIN HCL 0.4 MG PO CAPS
0.4000 mg | ORAL_CAPSULE | Freq: Every day | ORAL | Status: DC
  Administered 2020-09-02: 0.4 mg via ORAL
  Filled 2020-09-02: qty 1

## 2020-09-02 MED ORDER — LIDOCAINE 2% (20 MG/ML) 5 ML SYRINGE
INTRAMUSCULAR | Status: DC | PRN
  Administered 2020-09-02: 100 mg via INTRAVENOUS

## 2020-09-02 MED ORDER — CHLORHEXIDINE GLUCONATE 0.12 % MT SOLN
15.0000 mL | Freq: Once | OROMUCOSAL | Status: AC
  Administered 2020-09-02: 15 mL via OROMUCOSAL

## 2020-09-02 MED ORDER — SODIUM CHLORIDE 0.9 % IR SOLN
3000.0000 mL | Status: DC
  Administered 2020-09-02: 3000 mL

## 2020-09-02 MED ORDER — DEXAMETHASONE SODIUM PHOSPHATE 10 MG/ML IJ SOLN
INTRAMUSCULAR | Status: DC | PRN
  Administered 2020-09-02: 4 mg via INTRAVENOUS

## 2020-09-02 MED ORDER — EPHEDRINE SULFATE-NACL 50-0.9 MG/10ML-% IV SOSY
PREFILLED_SYRINGE | INTRAVENOUS | Status: DC | PRN
  Administered 2020-09-02: 5 mg via INTRAVENOUS
  Administered 2020-09-02: 10 mg via INTRAVENOUS

## 2020-09-02 MED ORDER — DEXAMETHASONE SODIUM PHOSPHATE 10 MG/ML IJ SOLN
INTRAMUSCULAR | Status: AC
  Filled 2020-09-02: qty 1

## 2020-09-02 MED ORDER — GABAPENTIN 300 MG PO CAPS
300.0000 mg | ORAL_CAPSULE | Freq: Three times a day (TID) | ORAL | Status: DC
  Administered 2020-09-02 (×2): 300 mg via ORAL
  Filled 2020-09-02 (×3): qty 1

## 2020-09-02 MED ORDER — ATORVASTATIN CALCIUM 40 MG PO TABS
40.0000 mg | ORAL_TABLET | Freq: Every day | ORAL | Status: DC
  Administered 2020-09-02: 40 mg via ORAL
  Filled 2020-09-02: qty 1

## 2020-09-02 MED ORDER — DULOXETINE HCL 30 MG PO CPEP
30.0000 mg | ORAL_CAPSULE | Freq: Every day | ORAL | Status: DC
  Administered 2020-09-02: 30 mg via ORAL
  Filled 2020-09-02: qty 1

## 2020-09-02 MED ORDER — ONDANSETRON HCL 4 MG/2ML IJ SOLN
INTRAMUSCULAR | Status: DC | PRN
  Administered 2020-09-02: 4 mg via INTRAVENOUS

## 2020-09-02 MED ORDER — PROPOFOL 10 MG/ML IV BOLUS
INTRAVENOUS | Status: DC | PRN
  Administered 2020-09-02: 170 mg via INTRAVENOUS

## 2020-09-02 MED ORDER — ORAL CARE MOUTH RINSE: 15.0000 mL | Freq: Once | OROMUCOSAL | Status: AC

## 2020-09-02 MED ORDER — INSULIN ASPART 100 UNIT/ML IJ SOLN
0.0000 [IU] | Freq: Every day | INTRAMUSCULAR | Status: DC
Start: 2020-09-02 — End: 2020-09-03
  Administered 2020-09-02: 2 [IU] via SUBCUTANEOUS

## 2020-09-02 MED ORDER — PROPOFOL 10 MG/ML IV BOLUS
INTRAVENOUS | Status: AC
  Filled 2020-09-02: qty 20

## 2020-09-02 MED ORDER — ACETAMINOPHEN 500 MG PO TABS
1000.0000 mg | ORAL_TABLET | Freq: Four times a day (QID) | ORAL | Status: DC
  Administered 2020-09-02 – 2020-09-03 (×3): 1000 mg via ORAL
  Filled 2020-09-02 (×3): qty 2

## 2020-09-02 MED ORDER — DEXTROSE-NACL 5-0.45 % IV SOLN: INTRAVENOUS | Status: DC

## 2020-09-02 MED ORDER — TRAMADOL HCL 50 MG PO TABS: 50.0000 mg | ORAL_TABLET | Freq: Four times a day (QID) | ORAL | Status: DC | PRN

## 2020-09-02 MED ORDER — FENTANYL CITRATE (PF) 100 MCG/2ML IJ SOLN: 25.0000 ug | INTRAMUSCULAR | Status: DC | PRN

## 2020-09-02 MED ORDER — ACETAMINOPHEN 500 MG PO TABS
1000.0000 mg | ORAL_TABLET | Freq: Once | ORAL | Status: AC
  Administered 2020-09-02: 1000 mg via ORAL
  Filled 2020-09-02: qty 2

## 2020-09-02 MED ORDER — POLYETHYLENE GLYCOL 3350 17 G PO PACK
17.0000 g | PACK | Freq: Every day | ORAL | Status: DC
  Administered 2020-09-02: 17 g via ORAL
  Filled 2020-09-02: qty 1

## 2020-09-02 MED ORDER — SODIUM CHLORIDE 0.9 % IV SOLN
2.0000 g | INTRAVENOUS | Status: AC
  Administered 2020-09-02: 2 g via INTRAVENOUS
  Filled 2020-09-02: qty 2

## 2020-09-02 SURGICAL SUPPLY — 17 items
BAG URINE DRAIN 2000ML AR STRL (UROLOGICAL SUPPLIES) ×2 IMPLANT
BAG URO CATCHER STRL LF (MISCELLANEOUS) ×2 IMPLANT
CATH FOLEY 3WAY 30CC 22FR (CATHETERS) ×2 IMPLANT
DRAPE FOOT SWITCH (DRAPES) ×2 IMPLANT
ELECT REM PT RETURN 15FT ADLT (MISCELLANEOUS) ×2 IMPLANT
GLOVE SURG POLYISO LF SZ8 (GLOVE) ×2 IMPLANT
GOWN STRL REUS W/TWL XL LVL3 (GOWN DISPOSABLE) ×2 IMPLANT
HOLDER FOLEY CATH W/STRAP (MISCELLANEOUS) ×2 IMPLANT
KIT TURNOVER KIT A (KITS) ×2 IMPLANT
LOOP CUT BIPOLAR 24F LRG (ELECTROSURGICAL) ×2 IMPLANT
MANIFOLD NEPTUNE II (INSTRUMENTS) ×2 IMPLANT
PACK CYSTO (CUSTOM PROCEDURE TRAY) ×2 IMPLANT
SYR 30ML LL (SYRINGE) ×2 IMPLANT
SYR TOOMEY IRRIG 70ML (MISCELLANEOUS) ×2
SYRINGE TOOMEY IRRIG 70ML (MISCELLANEOUS) ×1 IMPLANT
TUBING CONNECTING 10 (TUBING) ×2 IMPLANT
TUBING UROLOGY SET (TUBING) ×2 IMPLANT

## 2020-09-02 NOTE — Op Note (Addendum)
Preoperative Diagnosis: BPH with LUTS  Postoperative Diagnosis:  Same  Procedure(s) Performed:   1. Cystourethroscopy 2. Transurethral resection of prostate 3. Simple Foley catheter placement  Teaching Surgeon:  Bjorn Pippin, MD  Resident Surgeon:  Cherlyn Labella, MD  Assistant(s):  None  Anesthesia:  General via LMA  Fluids:  See anesthesia record  Estimated blood loss:  50 mL  Specimens:  Prostatic chips sent collectively for pathology  Cultures:  None  Drains:  41Fr 3-way urethral Foley catheter  Complications:  None  Indications: 80yo male with a past medical history of BPH s/p REZUM and hematuria with an ulcerative lesion of the prostate and LUTS and a median lobe. He presents today for cystoscopy with TURP. Risks & benefits of the procedure discussed with the patient, who wishes to proceed.  Findings:  Moderate bilateral lobar hypertrophy, significant median lobe, long prostatic urethra, area of ulceration on the right lobe of the prostate appeared to be healing. Successful takedown of median lobe and lateral lobes with open hemostatic prostatic urethra at the end of the case.   Description:  The patient was correctly identified in the preop holding area where written informed consent as well potential risk and complication reviewed. He agreed. They were brought back to the operative suite where a preinduction timeout was performed. Once correct information was verified, general anesthesia was induced. They were then gently placed into dorsal lithotomy position with SCDs in place for VTE prophylaxis. They were prepped and draped in the usual sterile fashion and given appropriate preoperative antibiotics with ancef. A second timeout was then performed.   We inserted a 41F rigid cystoscope per urethra with copious lubrication and normal saline irrigation running. This demonstrated findings as described above.    We switched to a 26 [place] resectoscope with Wandra Scot working  element and bipolar electrocautery loop. We proceeded to perform resection of right lateral lobe, and left lateral lobe, and and median lobe, paying careful attention to remain clear of his bilateral ureteral orifices, which were clearly identified. We resected to a depth of stroma just superficial to prostatic capsule, obtaining satisfactory hemostasis along the way using bipolar electrocautery.  We paid careful attention to limit our resection distally to within the verumontanum to avoid involving the extrinsic sphincter within our resection.  Prostatic tissue chips were evacuated using [place]y syringe and bladder filling and evacuation and sent collectively for pathology analysis.    Once we felt satisfied with our resection, we withdrew our scope to the verumontanum.  Here we were able to appreciate a satisfactory urine channel with direct visualization of his posterior bladder wall.  We again ensured strict hemostasis throughout our resected areas with our irrigation turned off.  We rechecked the bilateral ureteral orifices which were uninvolved with our resection. Leaving the patient's bladder full, we removed all instrumentation and inserted a 22 French 3-way  Foley catheter with 30 mL water in the balloon and placed this to drainage. We attached the catheter to CBI, but left it off as his urine was draining clear. We placed foley to moderate traction to be removed in 4 hours post operatively.  He was awoken from anesthesia and taken to recovery area.   Post Op Plan:   1. Admit patient to urology 2. Wean CBI to keep urine pink-tinged or more clear 3. PACU H&H 4. Clear liquid diet, ADAT  Attestation:  Dr. Annabell Howells was present and scrubbed for the entirety of the procedure.    Cherlyn Labella, MD [facility]  [facility]

## 2020-09-02 NOTE — Transfer of Care (Signed)
Immediate Anesthesia Transfer of Care Note  Patient: Stephen Burns  Procedure(s) Performed: TRANSURETHRAL RESECTION OF THE PROSTATE (TURP)  Patient Location: PACU  Anesthesia Type:General  Level of Consciousness: drowsy  Airway & Oxygen Therapy: Patient Spontanous Breathing and Patient connected to face mask oxygen  Post-op Assessment: Report given to RN  Post vital signs: Reviewed and stable  Last Vitals:  Vitals Value Taken Time  BP 112/71 09/02/20 1024  Temp    Pulse 76 09/02/20 1028  Resp 21 09/02/20 1028  SpO2 100 % 09/02/20 1028  Vitals shown include unvalidated device data.  Last Pain:  Vitals:   09/02/20 0741  TempSrc: Oral  PainSc: 0-No pain         Complications: No notable events documented.

## 2020-09-02 NOTE — Discharge Instructions (Signed)
You may see some blood in the urine and may have some burning with urination for 48-72 hours. You also may notice that you have to urinate more frequently or urgently after your procedure which is normal.  You should call should you develop an inability urinate, fever > 101, persistent nausea and vomiting that prevents you from eating or drinking to stay hydrated.  If you have a stent, you will likely urinate more frequently and urgently until the stent is removed and you may experience some discomfort/pain in the lower abdomen and flank especially when urinating. You may take pain medication prescribed to you if needed for pain. You may also intermittently have blood in the urine until the stent is removed. If you have a catheter, you will be taught how to take care of the catheter by the nursing staff prior to discharge from the hospital.  You may periodically feel a strong urge to void with the catheter in place.  This is a bladder spasm and most often can occur when having a bowel movement or moving around. It is typically self-limited and usually will stop after a few minutes.  You may use some Vaseline or Neosporin around the tip of the catheter to reduce friction at the tip of the penis. You may also see some blood in the urine.  A very small amount of blood can make the urine look quite red.  As long as the catheter is draining well, there usually is not a problem.  However, if the catheter is not draining well and is bloody, you should call the office ([phone]) to notify us.  

## 2020-09-02 NOTE — Anesthesia Postprocedure Evaluation (Signed)
Anesthesia Post Note  Patient: HONEST VANLEER  Procedure(s) Performed: TRANSURETHRAL RESECTION OF THE PROSTATE (TURP)     Patient location during evaluation: PACU Anesthesia Type: General Level of consciousness: awake and alert and oriented Pain management: pain level controlled Vital Signs Assessment: post-procedure vital signs reviewed and stable Respiratory status: spontaneous breathing, nonlabored ventilation and respiratory function stable Cardiovascular status: blood pressure returned to baseline Postop Assessment: no apparent nausea or vomiting Anesthetic complications: no   No notable events documented.  Last Vitals:  Vitals:   09/02/20 1045 09/02/20 1100  BP: 108/61 99/69  Pulse: 67 75  Resp: 14 12  Temp:  36.4 C  SpO2: 97% 97%    Last Pain:  Vitals:   09/02/20 1100  TempSrc:   PainSc: 0-No pain                 Kaylyn Layer

## 2020-09-02 NOTE — Anesthesia Procedure Notes (Signed)
Procedure Name: LMA Insertion Date/Time: 09/02/2020 9:20 AM Performed by: Briant Sites, CRNA Pre-anesthesia Checklist: Patient identified, Emergency Drugs available, Suction available and Patient being monitored Patient Re-evaluated:Patient Re-evaluated prior to induction Oxygen Delivery Method: Circle system utilized Preoxygenation: Pre-oxygenation with 100% oxygen Induction Type: IV induction Ventilation: Mask ventilation without difficulty LMA: LMA inserted LMA Size: 5.0 Number of attempts: 1 Airway Equipment and Method: Bite block Placement Confirmation: positive ETCO2 Tube secured with: Tape Dental Injury: Teeth and Oropharynx as per pre-operative assessment

## 2020-09-02 NOTE — Interval H&P Note (Signed)
History and Physical Interval Note:  He has had no further hematuria.   09/02/2020 9:07 AM  [name]  has presented today for surgery, with the diagnosis of BENIGN PROSTAE HYPERPLASIA WITH  BLADDER OUTLET OBSTRUCTION AND HEMATURIA.  The various methods of treatment have been discussed with the patient and family. After consideration of risks, benefits and other options for treatment, the patient has consented to  Procedure(s): TRANSURETHRAL RESECTION OF THE PROSTATE (TURP) (N/A) as a surgical intervention.  The patient's history has been reviewed, patient examined, no change in status, stable for surgery.  I have reviewed the patient's chart and labs.  Questions were answered to the patient's satisfaction.     Stephen Burns

## 2020-09-03 ENCOUNTER — Encounter (HOSPITAL_COMMUNITY): Payer: Self-pay | Admitting: Urology

## 2020-09-03 DIAGNOSIS — N401 Enlarged prostate with lower urinary tract symptoms: Secondary | ICD-10-CM | POA: Diagnosis not present

## 2020-09-03 LAB — BASIC METABOLIC PANEL
Anion gap: 9 (ref 5–15)
BUN: 18 mg/dL (ref 8–23)
CO2: 25 mmol/L (ref 22–32)
Calcium: 8.8 mg/dL — ABNORMAL LOW (ref 8.9–10.3)
Chloride: 105 mmol/L (ref 98–111)
Creatinine, Ser: 0.75 mg/dL (ref 0.61–1.24)
GFR, Estimated: >60 mL/min (ref >60)
Glucose, Bld: 228 mg/dL — ABNORMAL HIGH (ref 70–99)
Potassium: 4.3 mmol/L (ref 3.5–5.1)
Sodium: 139 mmol/L (ref 135–145)

## 2020-09-03 LAB — HEMOGLOBIN AND HEMATOCRIT, BLOOD
HCT: 39.3 % (ref 39.0–52.0)
Hemoglobin: 13 g/dL (ref 13.0–17.0)

## 2020-09-03 LAB — GLUCOSE, CAPILLARY: Glucose-Capillary: 233 mg/dL — ABNORMAL HIGH (ref 70–99)

## 2020-09-03 LAB — SURGICAL PATHOLOGY

## 2020-09-03 NOTE — Plan of Care (Signed)
  Problem: Health Behavior/Discharge Planning: Goal: Ability to manage health-related needs will improve Outcome: Progressing   Problem: Clinical Measurements: Goal: Will remain free from infection Outcome: Progressing   Problem: Activity: Goal: Risk for activity intolerance will decrease Outcome: Progressing   Problem: Pain Managment: Goal: General experience of comfort will improve Outcome: Progressing   Problem: Nutrition: Goal: Adequate nutrition will be maintained Outcome: Adequate for Discharge

## 2020-09-03 NOTE — Discharge Summary (Signed)
[facility] Discharge Summary  Admit date: 09/02/2020  Discharge date and time: 09/03/20   Discharge to: Home  Discharge Service: Urology  Discharge Attending Physician:  Bjorn Pippin, MD  Discharge  Diagnoses: BPH  Secondary Diagnosis: Active Problems:   BPH (benign prostatic hyperplasia)   OR Procedures: Procedure(s): TRANSURETHRAL RESECTION OF THE PROSTATE (TURP) 09/02/2020   Ancillary Procedures: None   Discharge Day Services: The patient was seen and examined by the Urology team both in the morning and immediately prior to discharge.  Vital signs and laboratory values were stable and within normal limits.  The physical exam was benign and unchanged and all surgical wounds were examined.  Discharge instructions were explained and all questions answered.  Subjective  No acute events overnight. Pain Controlled. No fever or chills.  Objective Patient Vitals for the past 8 hrs:  BP Temp Temp src Pulse Resp SpO2  09/03/20 0622 114/68 98.7 F (37.1 C) Oral 75 18 98 %  09/03/20 0232 110/60 97.8 F (36.6 C) Oral 76 16 95 %   Total I/O In: 496.6 [P.O.:240; I.V.:256.6] Out: 250 [Urine:250]  General Appearance:        No acute distress Lungs:                       Normal work of breathing on room air Heart:                                Regular rate and rhythm Abdomen:                         Soft, non-tender, non-distended Extremities:                      Warm and well perfused   Hospital Course:  The patient underwent TURP on 09/02/2020.  The patient tolerated the procedure well, was extubated in the OR, and afterwards was taken to the PACU for routine post-surgical care. When stable the patient was transferred to the floor.   The patient did well postoperatively.  The patient's diet was slowly advanced and at the time of discharge was tolerating a regular diet.  The patient was discharged home 1 Day Post-Op, at which point was tolerating a regular solid diet, was able  to void spontaneously, have adequate pain control with P.O. pain medication, and could ambulate without difficulty. The patient will follow up with [place] for post op check.   Condition at Discharge: Improved  Discharge Medications:  Allergies as of 09/03/2020       Reactions   Sulfa Antibiotics Swelling   Joint swelling        Medication List     TAKE these medications    B-D ULTRAFINE III SHORT PEN 31G X 8 MM Misc Generic drug: Insulin Pen Needle SMARTSIG:1 Each SUB-Q Daily   Basaglar KwikPen 100 UNIT/ML Inject 35 Units into the skin in the morning.   carbidopa-levodopa 50-200 MG tablet Commonly known as: SINEMET CR Take 1 tablet by mouth 2 (two) times daily.   diclofenac Sodium 1 % Gel Commonly known as: VOLTAREN Apply 4 g topically daily as needed (knee pain).   fluticasone 50 MCG/ACT nasal spray Commonly known as: FLONASE Place 1 spray into both nostrils daily as needed for allergies or rhinitis.   gabapentin 300 MG capsule Commonly known as: NEURONTIN Take 1 capsule (300  mg total) by mouth 3 (three) times daily.   MULTIPLE MINERALS PO Take 1 tablet by mouth daily.   polyethylene glycol 17 g packet Commonly known as: MIRALAX / GLYCOLAX Take 17 g by mouth at bedtime.       ASK your doctor about these medications    atorvastatin 40 MG tablet Commonly known as: LIPITOR Take 1 tablet (40 mg total) by mouth daily.   cetirizine 10 MG tablet Commonly known as: Allergy (Cetirizine) Take 1 tablet (10 mg total) by mouth daily.   DULoxetine 30 MG capsule Commonly known as: CYMBALTA Take 1 capsule (30 mg total) by mouth 2 (two) times daily.   finasteride 5 MG tablet Commonly known as: PROSCAR Take 1 tablet (5 mg total) by mouth daily.   tamsulosin 0.4 MG Caps capsule Commonly known as: FLOMAX Take 1 capsule (0.4 mg total) by mouth daily.

## 2020-09-03 NOTE — Progress Notes (Signed)
Reviewed discharge instructions with patient.  Patient verbalized understanding of instructions including home medications and follow up instructions. Lakelynn Severtson, Yancey Flemings, RN

## 2020-09-03 NOTE — Progress Notes (Signed)
Urology Progress Note   1 Day Post-Op from transurethral resection of the prostate.  Subjective: NAEON.  Vital signs are stable.  CBI clamped.  Urine is clear thin no clots.  Patient is ambulated.  Has not required any as needed's.  Hemoglobin stable at 13.  Objective: Vital signs in last 24 hours: Temp:  [97.6 F (36.4 C)-98.7 F (37.1 C)] 98.7 F (37.1 C) (07/27 0622) Pulse Rate:  [53-80] 75 (07/27 0622) Resp:  [11-19] 18 (07/27 0622) BP: (99-130)/(60-80) 114/68 (07/27 0622) SpO2:  [94 %-100 %] 98 % (07/27 0622) Weight:  [89.8 kg] 89.8 kg (07/26 0724)  Intake/Output from previous day: 07/26 0701 - 07/27 0700 In: 4328.9 [P.O.:480; I.V.:1912.3; IV Piggyback:136.7] Out: 5600 [Urine:5550; Blood:50] Intake/Output this shift: No intake/output data recorded.  Physical Exam:  General: Alert and oriented CV: Regular rate Lungs: No increased work of breathing Abdomen:  Soft, not tender.  GU: Foley in place draining clear yellow urine CBI clamped Ext: NT, No erythema  Lab Results: Recent Labs    09/02/20 1031 09/03/20 0451  HGB 13.6 13.0  HCT 40.7 39.3   Recent Labs    09/03/20 0451  NA 139  K 4.3  CL 105  CO2 25  GLUCOSE 228*  BUN 18  CREATININE 0.75  CALCIUM 8.8*    Studies/Results: No results found.  Assessment/Plan:  80 y.o. male s/p transurethral resection of the prostate.  Overall doing well post-op.   -We will stop IV fluids today and advance patient to regular diet -CBI has been clamped at the bedside.  Would like to trial void the patient today. -His home meds were ordered as well as sliding scale insulin.  Dispo: Likely home today after trial of void   LOS: 0 days

## 2020-09-03 NOTE — Plan of Care (Signed)
  Problem: Education: Goal: Knowledge of General Education information will improve Description: Including pain rating scale, medication(s)/side effects and non-pharmacologic comfort measures Outcome: Completed/Met   Problem: Clinical Measurements: Goal: Respiratory complications will improve Outcome: Completed/Met Goal: Cardiovascular complication will be avoided Outcome: Completed/Met   Problem: Activity: Goal: Risk for activity intolerance will decrease Outcome: Completed/Met

## 2020-09-25 ENCOUNTER — Encounter: Payer: Self-pay | Admitting: Nurse Practitioner

## 2020-09-25 ENCOUNTER — Non-Acute Institutional Stay: Payer: Medicare Other | Admitting: Nurse Practitioner

## 2020-09-25 ENCOUNTER — Other Ambulatory Visit: Payer: Self-pay

## 2020-09-25 VITALS — BP 120/60 | HR 76 | Temp 97.1°F | Resp 16 | Ht 70.0 in | Wt 200.0 lb

## 2020-09-25 DIAGNOSIS — H6120 Impacted cerumen, unspecified ear: Secondary | ICD-10-CM | POA: Insufficient documentation

## 2020-09-25 DIAGNOSIS — E119 Type 2 diabetes mellitus without complications: Secondary | ICD-10-CM

## 2020-09-25 DIAGNOSIS — M8949 Other hypertrophic osteoarthropathy, multiple sites: Secondary | ICD-10-CM

## 2020-09-25 DIAGNOSIS — Z981 Arthrodesis status: Secondary | ICD-10-CM

## 2020-09-25 DIAGNOSIS — J309 Allergic rhinitis, unspecified: Secondary | ICD-10-CM

## 2020-09-25 DIAGNOSIS — N200 Calculus of kidney: Secondary | ICD-10-CM

## 2020-09-25 DIAGNOSIS — Z86711 Personal history of pulmonary embolism: Secondary | ICD-10-CM

## 2020-09-25 DIAGNOSIS — M159 Polyosteoarthritis, unspecified: Secondary | ICD-10-CM | POA: Insufficient documentation

## 2020-09-25 DIAGNOSIS — M26629 Arthralgia of temporomandibular joint, unspecified side: Secondary | ICD-10-CM | POA: Insufficient documentation

## 2020-09-25 DIAGNOSIS — H6123 Impacted cerumen, bilateral: Secondary | ICD-10-CM

## 2020-09-25 DIAGNOSIS — E1140 Type 2 diabetes mellitus with diabetic neuropathy, unspecified: Secondary | ICD-10-CM | POA: Diagnosis not present

## 2020-09-25 DIAGNOSIS — Z794 Long term (current) use of insulin: Secondary | ICD-10-CM

## 2020-09-25 DIAGNOSIS — M26622 Arthralgia of left temporomandibular joint: Secondary | ICD-10-CM | POA: Diagnosis not present

## 2020-09-25 DIAGNOSIS — K5901 Slow transit constipation: Secondary | ICD-10-CM

## 2020-09-25 DIAGNOSIS — N400 Enlarged prostate without lower urinary tract symptoms: Secondary | ICD-10-CM

## 2020-09-25 DIAGNOSIS — H8113 Benign paroxysmal vertigo, bilateral: Secondary | ICD-10-CM

## 2020-09-25 NOTE — Assessment & Plan Note (Signed)
T2DM insulin dependent, takes insulin, monitor CBG am and pm at home, well controlled. Hgb a1c 6.2 08/25/20

## 2020-09-25 NOTE — Assessment & Plan Note (Addendum)
C/o anterior left ear region pain with mouth open/close or chewing, denied pain in ear, headache, neck pain, mouth pain. External ear canal no redness, drainage, tympanic membrane intact, no effusion, no pain during my otoscopic ear examination. Left x 2 days. Chew soft food, OTC Ibuprofen/Aleve, cold/hot compress.

## 2020-09-25 NOTE — Assessment & Plan Note (Signed)
S/p R shoulder replacement, full ROM with mild discomfort, not as strong as prior. Knee pain. Seeing Ortho

## 2020-09-25 NOTE — Assessment & Plan Note (Signed)
takes Zyrtec. 

## 2020-09-25 NOTE — Assessment & Plan Note (Signed)
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

## 2020-09-25 NOTE — Assessment & Plan Note (Signed)
takes MiraLax, prune juice 

## 2020-09-25 NOTE — Assessment & Plan Note (Signed)
Diabetic neuropathy, in feet/toes, takes Cymbalta, Gabapentin

## 2020-09-25 NOTE — Assessment & Plan Note (Addendum)
had urology evaluation, monitor PSA, takes Finasteride. 09/02/20 Dr. Annabell Howells TURP

## 2020-09-25 NOTE — Assessment & Plan Note (Signed)
R kidney stone, occasional hematuria, not passed per patient

## 2020-09-25 NOTE — Progress Notes (Signed)
Location:   clinic [facility]   Place of Service:  Clinic (12) Provider: Chipper OmanManXie Latoi Giraldo NP  Code Status: DNR Goals of Care: [place] 09/25/2020  Does Patient Have a Medical Advance Directive? No  Type of Advance Directive -  Does patient want to make changes to medical advance directive? -  Copy of Healthcare Power of Attorney in Chart? -  Would patient like information on creating a medical advance directive? No - Patient declined     Chief Complaint  Patient presents with   Acute Visit    Patient complains of pain in left ear starting about 2 days ago    HPI: Patient is a 80 y.o. male seen today for medical management of chronic diseases.    C/o anterior left ear region pain with mouth open/close or chewing, denied pain in ear, headache, neck pain, mouth pain. External ear canal no redness, drainage, tympanic membrane intact, no effusion, no pain during my otoscopic ear examination.    T2DM insulin dependent, takes insulin, monitor CBG am and pm at home, well controlled. Hgb a1c 6.2 08/25/20             Diabetic neuropathy, in feet/toes, takes Cymbalta, Gabapentin             Allergic rhinitis, takes Zyrtec             Constipation, takes MiraLax, prune juice             BPPV, last treated by therapist             BPH, had urology evaluation, monitor PSA, takes Finasteride, off Tamsulosin. 09/02/20 Dr. 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 Knee pain. Seeing Ortho   Past Medical History:  Diagnosis Date   Arthritis    Complication of anesthesia    Difficult to wake up   Diabetes mellitus without complication  (HCC)    Headache    History of kidney stones    Pulmonary embolism (HCC)    After shoulder surgery   Vertigo     Past Surgical History:  Procedure Laterality Date   ADENOIDECTOMY Bilateral    FINGER SURGERY     Left middle   FOOT SURGERY     forehead surgery     L4-L5 fusion     LITHOTRIPSY     Prostrate     Frozen   SHOULDER SURGERY     Complete replacement to right shoulder   TONSILLECTOMY     TRANSURETHRAL RESECTION OF PROSTATE N/A 09/02/2020   Procedure: TRANSURETHRAL RESECTION OF THE PROSTATE (TURP);  Surgeon: Bjorn PippinWrenn, John, MD;  Location: [facility];  Service: Urology;  Laterality: N/A;    Allergies  Allergen Reactions   Sulfa Antibiotics Swelling    Joint swelling    Allergies as of 09/25/2020       Reactions   Sulfa Antibiotics Swelling   Joint swelling  Medication List        Accurate as of September 25, 2020 11:59 PM. If you have any questions, ask your nurse or doctor.          STOP taking these medications    tamsulosin 0.4 MG Caps capsule Commonly known as: FLOMAX Stopped by: Janita Camberos X Lillyona Polasek, NP       TAKE these medications    atorvastatin 40 MG tablet Commonly known as: LIPITOR Take 1 tablet (40 mg total) by mouth daily. What changed: when to take this   B-D ULTRAFINE III SHORT PEN 31G X 8 MM Misc Generic drug: Insulin Pen Needle SMARTSIG:1 Each SUB-Q Daily   Basaglar KwikPen 100 UNIT/ML Inject 35 Units into the skin in the morning.   carbidopa-levodopa 50-200 MG tablet Commonly known as: SINEMET CR Take 1 tablet by mouth 2 (two) times daily.   cetirizine 10 MG tablet Commonly known as: Allergy (Cetirizine) Take 1 tablet (10 mg total) by mouth daily. What changed: when to take this   diclofenac Sodium 1 % Gel Commonly known as: VOLTAREN Apply 4 g topically daily as needed (knee pain).   DULoxetine 30 MG capsule Commonly known as: CYMBALTA Take 1 capsule (30 mg total) by mouth 2 (two) times daily. What changed: when to take  this   finasteride 5 MG tablet Commonly known as: PROSCAR Take 1 tablet (5 mg total) by mouth daily. What changed: when to take this   fluticasone 50 MCG/ACT nasal spray Commonly known as: FLONASE Place 1 spray into both nostrils daily as needed for allergies or rhinitis.   gabapentin 300 MG capsule Commonly known as: NEURONTIN Take 1 capsule (300 mg total) by mouth 3 (three) times daily.   MULTIPLE MINERALS PO Take 1 tablet by mouth daily.   polyethylene glycol 17 g packet Commonly known as: MIRALAX / GLYCOLAX Take 17 g by mouth at bedtime.        Review of Systems:  Review of Systems  Constitutional:  Negative for activity change, fatigue and fever.  HENT:  Positive for hearing loss. Negative for congestion, dental problem, drooling, ear discharge, facial swelling, rhinorrhea, sinus pressure, sinus pain, sore throat, trouble swallowing and voice change.        Anterior left ear/tragus region pain with mouth open/close or chewing.   Eyes:  Negative for visual disturbance.  Respiratory:  Negative for cough, shortness of breath and wheezing.   Cardiovascular:  Negative for chest pain, palpitations and leg swelling.  Gastrointestinal:  Negative for abdominal pain, constipation, nausea and vomiting.  Genitourinary:  Positive for frequency. Negative for difficulty urinating and urgency.       3x/night  Musculoskeletal:  Positive for arthralgias and gait problem.  Skin:  Negative for color change.  Neurological:  Positive for numbness. Negative for tremors, speech difficulty, weakness and headaches.  Psychiatric/Behavioral:  Positive for sleep disturbance. Negative for confusion. The patient is not nervous/anxious.    Health Maintenance  Topic Date Due   COVID-19 Vaccine (1) Never done   FOOT EXAM  Never done   OPHTHALMOLOGY EXAM  Never done   Hepatitis C Screening  Never done   Zoster Vaccines- Shingrix (1 of 2) Never done   PNA vac Low Risk Adult (1 of 2 - PCV13) Never  done   TETANUS/TDAP  06/10/2011   INFLUENZA VACCINE  09/08/2020   HEMOGLOBIN A1C  02/25/2021   URINE MICROALBUMIN  05/29/2021   HPV VACCINES  Aged Out    Physical Exam:  Vitals:   09/25/20 1324  BP: 120/60  Pulse: 76  Resp: 16  Temp: (!) 97.1 F (36.2 C)  SpO2: 96%  Weight: 200 lb (90.7 kg)  Height: 5\' 10"  (1.778 m)   Body mass index is 28.7 kg/m. Physical Exam Vitals and nursing note reviewed.  Constitutional:      Appearance: Normal appearance.  HENT:     Head: Normocephalic and atraumatic.     Right Ear: Tympanic membrane, ear canal and external ear normal.     Left Ear: Tympanic membrane, ear canal and external ear normal.     Ears:     Comments: Ear was removed with ear lavage. Tympanic membrane intact, no redness, effusion, or drainage seen.     Nose: Nose normal.     Mouth/Throat:     Mouth: Mucous membranes are moist.     Pharynx: No oropharyngeal exudate or posterior oropharyngeal erythema.     Comments: No lesion or sore liner of oral cavity. No pain in gum or teeth. Pain reproduced at the left TMJ with mouth open/close or chewing motion.  Eyes:     Extraocular Movements: Extraocular movements intact.     Conjunctiva/sclera: Conjunctivae normal.     Pupils: Pupils are equal, round, and reactive to light.  Cardiovascular:     Rate and Rhythm: Normal rate and regular rhythm.     Heart sounds: No murmur heard.    Comments: Hx of PAC Pulmonary:     Breath sounds: No wheezing, rhonchi or rales.  Abdominal:     General: Bowel sounds are normal.     Palpations: Abdomen is soft.     Tenderness: There is no abdominal tenderness. There is no right CVA tenderness, left CVA tenderness, guarding or rebound.  Musculoskeletal:     Cervical back: Normal range of motion and neck supple.     Right lower leg: No edema.     Left lower leg: No edema.     Comments: Right shoulder discomfort with ROM, not strong as the left. Left should discomfort just started.   Skin:     General: Skin is warm and dry.  Neurological:     General: No focal deficit present.     Mental Status: He is alert and oriented to person, place, and time. Mental status is at baseline.  Psychiatric:        Mood and Affect: Mood normal.        Behavior: Behavior normal.        Thought Content: Thought content normal.        Judgment: Judgment normal.    Labs reviewed: Basic Metabolic Panel: Recent Labs    05/01/20 0705 08/25/20 1336 09/03/20 0451  NA 139 140 139  K 4.4 4.2 4.3  CL 105 107 105  CO2 27 27 25   GLUCOSE 92 181* 228*  BUN 20 21 18   CREATININE 0.84 0.72 0.75  CALCIUM 9.1 9.2 8.8*  TSH 1.42  --   --    Liver Function Tests: Recent Labs    05/01/20 0705  AST 19  ALT 16  BILITOT 0.7  PROT 6.6   No results for input(s): LIPASE, AMYLASE in the last 8760 hours. No results for input(s): AMMONIA in the last 8760 hours. CBC: Recent Labs    05/01/20 0705 08/25/20 1336 09/02/20 1031 09/03/20 0451  WBC 4.7 5.6  --   --   NEUTROABS 2,792  --   --   --   HGB 15.1  14.6 13.6 13.0  HCT 44.8 43.2 40.7 39.3  MCV 93.9 94.1  --   --   PLT 237 229  --   --    Lipid Panel: Recent Labs    05/01/20 0705  CHOL 116  HDL 43  LDLCALC 57  TRIG 78  CHOLHDL 2.7   Lab Results  Component Value Date   HGBA1C 6.2 (H) 08/25/2020    Procedures since last visit: No results found.  Assessment/Plan  Temporomandibular joint (TMJ) pain C/o anterior left ear region pain with mouth open/close or chewing, denied pain in ear, headache, neck pain, mouth pain. External ear canal no redness, drainage, tympanic membrane intact, no effusion, no pain during my otoscopic ear examination. Left x 2 days. Chew soft food, OTC Ibuprofen/Aleve, cold/hot compress.   Insulin dependent type 2 diabetes mellitus (HCC)  T2DM insulin dependent, takes insulin, monitor CBG am and pm at home, well controlled. Hgb a1c 6.2 08/25/20  Type 2 diabetes mellitus with diabetic neuropathy, unspecified  (HCC) Diabetic neuropathy, in feet/toes, takes Cymbalta, Gabapentin  Allergic rhinitis  takes Zyrtec  Slow transit constipation takes MiraLax, prune juice  BPPV (benign paroxysmal positional vertigo), bilateral Treated  by therapist in the past.   BPH (benign prostatic hyperplasia) had urology evaluation, monitor PSA, takes Finasteride. 09/02/20 Dr. 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 Knee pain. Seeing Ortho  Cerumen impaction R+L, ear lavage to remove ear wax   Labs/tests ordered: none  Next appt:  4 weeks.

## 2020-09-25 NOTE — Assessment & Plan Note (Signed)
Treated  by therapist in the past.

## 2020-09-25 NOTE — Assessment & Plan Note (Signed)
Hx of lumbar spine fusion, with sciatica, spastic pain in legs occurs mostly in 3 am, takes Gabapentin, Sinemet 

## 2020-09-25 NOTE — Assessment & Plan Note (Signed)
R+L, ear lavage to remove ear wax

## 2020-09-29 ENCOUNTER — Encounter: Payer: Self-pay | Admitting: Nurse Practitioner

## 2020-09-29 ENCOUNTER — Other Ambulatory Visit: Payer: Self-pay | Admitting: *Deleted

## 2020-09-29 MED ORDER — BASAGLAR KWIKPEN 100 UNIT/ML ~~LOC~~ SOPN: 35.0000 [IU] | PEN_INJECTOR | Freq: Every morning | SUBCUTANEOUS | 1 refills | Status: DC

## 2020-09-29 NOTE — Telephone Encounter (Signed)
Rosey Bath with [facility] and patient called and requested refill.   Pended Rx and sent to [facility] for approval.

## 2020-10-09 ENCOUNTER — Non-Acute Institutional Stay: Payer: Medicare Other | Admitting: Nurse Practitioner

## 2020-10-09 ENCOUNTER — Other Ambulatory Visit: Payer: Self-pay

## 2020-10-09 ENCOUNTER — Encounter: Payer: Self-pay | Admitting: Nurse Practitioner

## 2020-10-09 DIAGNOSIS — M159 Polyosteoarthritis, unspecified: Secondary | ICD-10-CM

## 2020-10-09 DIAGNOSIS — K5901 Slow transit constipation: Secondary | ICD-10-CM | POA: Diagnosis not present

## 2020-10-09 DIAGNOSIS — N400 Enlarged prostate without lower urinary tract symptoms: Secondary | ICD-10-CM

## 2020-10-09 DIAGNOSIS — E1140 Type 2 diabetes mellitus with diabetic neuropathy, unspecified: Secondary | ICD-10-CM | POA: Diagnosis not present

## 2020-10-09 DIAGNOSIS — M8949 Other hypertrophic osteoarthropathy, multiple sites: Secondary | ICD-10-CM

## 2020-10-09 DIAGNOSIS — Z981 Arthrodesis status: Secondary | ICD-10-CM

## 2020-10-09 DIAGNOSIS — J309 Allergic rhinitis, unspecified: Secondary | ICD-10-CM | POA: Diagnosis not present

## 2020-10-09 DIAGNOSIS — Z86711 Personal history of pulmonary embolism: Secondary | ICD-10-CM

## 2020-10-09 DIAGNOSIS — H8113 Benign paroxysmal vertigo, bilateral: Secondary | ICD-10-CM

## 2020-10-09 DIAGNOSIS — M26622 Arthralgia of left temporomandibular joint: Secondary | ICD-10-CM

## 2020-10-09 DIAGNOSIS — N200 Calculus of kidney: Secondary | ICD-10-CM

## 2020-10-09 MED ORDER — BD PEN NEEDLE SHORT U/F 31G X 8 MM MISC: 3 refills | Status: DC

## 2020-10-09 MED ORDER — BASAGLAR KWIKPEN 100 UNIT/ML ~~LOC~~ SOPN: 35.0000 [IU] | PEN_INJECTOR | Freq: Every morning | SUBCUTANEOUS | 1 refills | Status: DC

## 2020-10-09 NOTE — Assessment & Plan Note (Signed)
L TMJ pain, resolved, but clicking with chewing still there, uses cold compress, recommend OT BioFreeze.

## 2020-10-09 NOTE — Assessment & Plan Note (Signed)
takes MiraLax, prune juice

## 2020-10-09 NOTE — Assessment & Plan Note (Signed)
R kidney stone, occasional hematuria, not passed per patient 

## 2020-10-09 NOTE — Assessment & Plan Note (Signed)
takes Zyrtec. 

## 2020-10-09 NOTE — Assessment & Plan Note (Signed)
had urology evaluation, monitor PSA, takes Finasteride, off Tamsulosin. 09/02/20 Dr. Annabell Howells TURP

## 2020-10-09 NOTE — Assessment & Plan Note (Signed)
Hx of lumbar spine fusion, with sciatica, spastic pain in legs occurs mostly in 3 am, takes Gabapentin, Sinemet 

## 2020-10-09 NOTE — Assessment & Plan Note (Signed)
T2DM insulin dependent, takes insulin, monitor CBG am and pm at home, well controlled. Hgb a1c 6.2 08/25/20. Diabetic neuropathy, in feet/toes, takes Cymbalta, Gabapentin

## 2020-10-09 NOTE — Assessment & Plan Note (Signed)
S/p R shoulder replacement, full ROM with mild discomfort, not as strong as prior. Knee pain. Seeing Ortho 

## 2020-10-09 NOTE — Progress Notes (Signed)
Location:   clinic [facility]   Place of Service:  Clinic (12) Provider: Chipper Oman NP  Code Status: DNR Goals of Care: [place] 10/09/2020  Does Patient Have a Medical Advance Directive? No  Type of Advance Directive -  Does patient want to make changes to medical advance directive? -  Copy of Healthcare Power of Attorney in Chart? -  Would patient like information on creating a medical advance directive? No - Patient declined     Chief Complaint  Patient presents with   Acute Visit    Patient presents for left jaw pain     HPI: Patient is a 80 y.o. male seen today for medical management of chronic diseases.     L TMJ pain, resolved, but clicking with chewing still there, uses cold compress, recommend OT BioFreeze.               T2DM insulin dependent, takes insulin, monitor CBG am and pm at home, well controlled. Hgb a1c 6.2 08/25/20             Diabetic neuropathy, in feet/toes, takes Cymbalta, Gabapentin             Allergic rhinitis, takes Zyrtec             Constipation, takes MiraLax, prune juice             BPPV, last treated by therapist             BPH, had urology evaluation, monitor PSA, takes Finasteride, off Tamsulosin. 09/02/20 Dr. 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 Knee pain. Seeing Ortho      Past Medical History:  Diagnosis Date   Arthritis    Complication of anesthesia    Difficult to wake up   Diabetes mellitus without complication (HCC)    Headache    History of kidney stones    Pulmonary embolism (HCC)    After shoulder surgery   Vertigo     Past Surgical History:   Procedure Laterality Date   ADENOIDECTOMY Bilateral    FINGER SURGERY     Left middle   FOOT SURGERY     forehead surgery     L4-L5 fusion     LITHOTRIPSY     Prostrate     Frozen   SHOULDER SURGERY     Complete replacement to right shoulder   TONSILLECTOMY     TRANSURETHRAL RESECTION OF PROSTATE N/A 09/02/2020   Procedure: TRANSURETHRAL RESECTION OF THE PROSTATE (TURP);  Surgeon: Bjorn Pippin, MD;  Location: [facility];  Service: Urology;  Laterality: N/A;    Allergies  Allergen Reactions   Sulfa Antibiotics Swelling    Joint swelling    Allergies as of 10/09/2020       Reactions   Sulfa Antibiotics Swelling   Joint swelling        Medication List  Accurate as of October 09, 2020 11:59 PM. If you have any questions, ask your nurse or doctor.          atorvastatin 40 MG tablet Commonly known as: LIPITOR Take 1 tablet (40 mg total) by mouth daily. What changed: when to take this   B-D ULTRAFINE III SHORT PEN 31G X 8 MM Misc Generic drug: Insulin Pen Needle SMARTSIG:1 Each SUB-Q Daily   Basaglar KwikPen 100 UNIT/ML Inject 35 Units into the skin in the morning.   carbidopa-levodopa 50-200 MG tablet Commonly known as: SINEMET CR Take 1 tablet by mouth 2 (two) times daily.   cetirizine 10 MG tablet Commonly known as: Allergy (Cetirizine) Take 1 tablet (10 mg total) by mouth daily. What changed: when to take this   diclofenac Sodium 1 % Gel Commonly known as: VOLTAREN Apply 4 g topically daily as needed (knee pain).   DULoxetine 30 MG capsule Commonly known as: CYMBALTA Take 1 capsule (30 mg total) by mouth 2 (two) times daily. What changed: when to take this   finasteride 5 MG tablet Commonly known as: PROSCAR Take 1 tablet (5 mg total) by mouth daily. What changed: when to take this   fluticasone 50 MCG/ACT nasal spray Commonly known as: FLONASE Place 1 spray into both nostrils daily as needed for allergies or rhinitis.   gabapentin 300 MG  capsule Commonly known as: NEURONTIN Take 1 capsule (300 mg total) by mouth 3 (three) times daily.   MULTIPLE MINERALS PO Take 1 tablet by mouth daily.   polyethylene glycol 17 g packet Commonly known as: MIRALAX / GLYCOLAX Take 17 g by mouth at bedtime.        Review of Systems:  Review of Systems  Constitutional:  Negative for activity change, fatigue and fever.  HENT:  Positive for hearing loss. Negative for congestion, dental problem, drooling, ear discharge, facial swelling, rhinorrhea, sinus pressure, sinus pain, sore throat, trouble swallowing and voice change.        Anterior left ear/tragus region pain with mouth open/close or chewing is resolved.   Eyes:  Negative for visual disturbance.  Respiratory:  Negative for cough, shortness of breath and wheezing.   Cardiovascular:  Negative for chest pain, palpitations and leg swelling.  Gastrointestinal:  Negative for abdominal pain, constipation, nausea and vomiting.  Genitourinary:  Positive for frequency. Negative for difficulty urinating and urgency.       3x/night  Musculoskeletal:  Positive for arthralgias and gait problem.  Skin:  Negative for color change.  Neurological:  Positive for numbness. Negative for tremors, speech difficulty, weakness and headaches.  Psychiatric/Behavioral:  Positive for sleep disturbance. Negative for confusion. The patient is not nervous/anxious.    Health Maintenance  Topic Date Due   COVID-19 Vaccine (1) Never done   FOOT EXAM  Never done   OPHTHALMOLOGY EXAM  Never done   Hepatitis C Screening  Never done   Zoster Vaccines- Shingrix (1 of 2) Never done   PNA vac Low Risk Adult (1 of 2 - PCV13) Never done   TETANUS/TDAP  06/10/2011   INFLUENZA VACCINE  09/08/2020   HEMOGLOBIN A1C  02/25/2021   URINE MICROALBUMIN  05/29/2021   HPV VACCINES  Aged Out    Physical Exam: Vitals:   10/09/20 1448  BP: 122/64  Pulse: 80  Resp: 18  Temp: (!) 96.9 F (36.1 C)  SpO2: 97%  Weight:  200 lb 6.4 oz (90.9 kg)  Height: 5\' 10"  (1.778 m)   Body  mass index is 28.75 kg/m. Physical Exam Vitals and nursing note reviewed.  Constitutional:      Appearance: Normal appearance.  HENT:     Head: Normocephalic and atraumatic.     Right Ear: Tympanic membrane, ear canal and external ear normal.     Left Ear: Tympanic membrane, ear canal and external ear normal.     Ears:     Comments: Ear was removed with ear lavage. Tympanic membrane intact, no redness, effusion, or drainage seen.     Nose: Nose normal.     Mouth/Throat:     Mouth: Mucous membranes are moist.     Pharynx: No oropharyngeal exudate or posterior oropharyngeal erythema.     Comments: Left TMJ clicking.  Eyes:     Extraocular Movements: Extraocular movements intact.     Conjunctiva/sclera: Conjunctivae normal.     Pupils: Pupils are equal, round, and reactive to light.  Cardiovascular:     Rate and Rhythm: Normal rate and regular rhythm.     Heart sounds: No murmur heard.    Comments: Hx of PAC Pulmonary:     Effort: Pulmonary effort is normal.     Breath sounds: No rales.  Abdominal:     General: Bowel sounds are normal.     Palpations: Abdomen is soft.     Tenderness: There is no abdominal tenderness.  Musculoskeletal:     Cervical back: Normal range of motion and neck supple.     Right lower leg: No edema.     Left lower leg: No edema.     Comments: Right shoulder discomfort with ROM, not strong as the left. Left should discomfort just started.   Skin:    General: Skin is warm and dry.  Neurological:     General: No focal deficit present.     Mental Status: He is alert and oriented to person, place, and time. Mental status is at baseline.  Psychiatric:        Mood and Affect: Mood normal.        Behavior: Behavior normal.        Thought Content: Thought content normal.        Judgment: Judgment normal.    Labs reviewed: Basic Metabolic Panel: Recent Labs    05/01/20 0705 08/25/20 1336  09/03/20 0451  NA 139 140 139  K 4.4 4.2 4.3  CL 105 107 105  CO2 27 27 25   GLUCOSE 92 181* 228*  BUN 20 21 18   CREATININE 0.84 0.72 0.75  CALCIUM 9.1 9.2 8.8*  TSH 1.42  --   --    Liver Function Tests: Recent Labs    05/01/20 0705  AST 19  ALT 16  BILITOT 0.7  PROT 6.6   No results for input(s): LIPASE, AMYLASE in the last 8760 hours. No results for input(s): AMMONIA in the last 8760 hours. CBC: Recent Labs    05/01/20 0705 08/25/20 1336 09/02/20 1031 09/03/20 0451  WBC 4.7 5.6  --   --   NEUTROABS 2,792  --   --   --   HGB 15.1 14.6 13.6 13.0  HCT 44.8 43.2 40.7 39.3  MCV 93.9 94.1  --   --   PLT 237 229  --   --    Lipid Panel: Recent Labs    05/01/20 0705  CHOL 116  HDL 43  LDLCALC 57  TRIG 78  CHOLHDL 2.7   Lab Results  Component Value Date   HGBA1C 6.2 (H)  08/25/2020    Procedures since last visit: No results found.  Assessment/Plan  Type 2 diabetes mellitus with diabetic neuropathy, unspecified (HCC) T2DM insulin dependent, takes insulin, monitor CBG am and pm at home, well controlled. Hgb a1c 6.2 08/25/20. Diabetic neuropathy, in feet/toes, takes Cymbalta, Gabapentin  Allergic rhinitis takes Zyrtec  Slow transit constipation  takes MiraLax, prune juice  BPPV (benign paroxysmal positional vertigo), bilateral ast treated by therapist  BPH (benign prostatic hyperplasia) had urology evaluation, monitor PSA, takes Finasteride, off Tamsulosin. 09/02/20 Dr. 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 Knee  pain. Seeing Ortho    Temporomandibular joint (TMJ) pain L TMJ pain, resolved, but clicking with chewing still there, uses cold compress, recommend OT BioFreeze.    Labs/tests ordered:  NONE  Next appt:  PRN

## 2020-10-09 NOTE — Assessment & Plan Note (Signed)
ast treated by therapist

## 2020-10-09 NOTE — Assessment & Plan Note (Signed)
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

## 2020-10-10 ENCOUNTER — Encounter: Payer: Self-pay | Admitting: Nurse Practitioner

## 2020-11-26 ENCOUNTER — Other Ambulatory Visit: Payer: Self-pay | Admitting: Nurse Practitioner

## 2020-11-26 DIAGNOSIS — E1140 Type 2 diabetes mellitus with diabetic neuropathy, unspecified: Secondary | ICD-10-CM

## 2020-12-09 ENCOUNTER — Other Ambulatory Visit: Payer: Self-pay | Admitting: Family Medicine

## 2020-12-09 DIAGNOSIS — E1140 Type 2 diabetes mellitus with diabetic neuropathy, unspecified: Secondary | ICD-10-CM

## 2020-12-09 LAB — HM DIABETES EYE EXAM

## 2020-12-10 ENCOUNTER — Encounter: Payer: Self-pay | Admitting: *Deleted

## 2020-12-18 ENCOUNTER — Other Ambulatory Visit: Payer: Medicare Other

## 2020-12-18 ENCOUNTER — Other Ambulatory Visit: Payer: Self-pay

## 2020-12-18 DIAGNOSIS — E1140 Type 2 diabetes mellitus with diabetic neuropathy, unspecified: Secondary | ICD-10-CM

## 2020-12-19 LAB — HEMOGLOBIN A1C
Hgb A1c MFr Bld: 6.5 % of total Hgb — ABNORMAL HIGH (ref <5.7)
Mean Plasma Glucose: 140 mg/dL
eAG (mmol/L): 7.7 mmol/L

## 2020-12-24 ENCOUNTER — Encounter: Payer: Self-pay | Admitting: Family Medicine

## 2020-12-24 ENCOUNTER — Non-Acute Institutional Stay: Payer: Medicare Other | Admitting: Family Medicine

## 2020-12-24 ENCOUNTER — Other Ambulatory Visit: Payer: Self-pay

## 2020-12-24 VITALS — BP 120/62 | HR 70 | Temp 97.7°F | Ht 70.0 in | Wt 204.4 lb

## 2020-12-24 DIAGNOSIS — E1140 Type 2 diabetes mellitus with diabetic neuropathy, unspecified: Secondary | ICD-10-CM | POA: Diagnosis not present

## 2020-12-24 DIAGNOSIS — E782 Mixed hyperlipidemia: Secondary | ICD-10-CM

## 2020-12-24 DIAGNOSIS — E1149 Type 2 diabetes mellitus with other diabetic neurological complication: Secondary | ICD-10-CM

## 2020-12-25 DIAGNOSIS — E1149 Type 2 diabetes mellitus with other diabetic neurological complication: Secondary | ICD-10-CM | POA: Insufficient documentation

## 2020-12-25 NOTE — Progress Notes (Signed)
Provider:  Jacalyn Lefevre, MD  Careteam: Patient Care Team: Frederica Kuster, MD as PCP - General (Family Medicine)  PLACE OF SERVICE:  [facility]  Advanced Directive information    Allergies  Allergen Reactions   Sulfa Antibiotics Swelling    Joint swelling    Chief Complaint  Patient presents with   Medical Management of Chronic Issues    Patient presents today for a 2 month follow-up.     HPI: Patient is a 80 y.o. male here to follow-up chronic medical problems including diabetes, hyperlipidemia, and diabetic neuropathy He had an A1c done last week.  A1c was 6.5.  He currently uses 35 units of basal glargine insulin in the mornings.  He has seen some fluctuations recently as he checks his glucometer twice daily.  Highest readings have been in the 120s.  I reassured him that with A1c at target of the mild elevations were not worrisome.  He had recent eye exam and everything checked okay.  He continues to have some symptoms consistent with neuropathy he currently is on duloxetine as well as gabapentin.  Dose of gabapentin is probably suboptimal. Review of last lipid panel shows LDL at goal.  Review of Systems:  Review of Systems  All other systems reviewed and are negative.  Past Medical History:  Diagnosis Date   Arthritis    Complication of anesthesia    Difficult to wake up   Diabetes mellitus without complication (HCC)    Headache    History of kidney stones    Pulmonary embolism (HCC)    After shoulder surgery   Vertigo    Past Surgical History:  Procedure Laterality Date   ADENOIDECTOMY Bilateral    FINGER SURGERY     Left middle   FOOT SURGERY     forehead surgery     L4-L5 fusion     LITHOTRIPSY     Prostrate     Frozen   SHOULDER SURGERY     Complete replacement to right shoulder   TONSILLECTOMY     TRANSURETHRAL RESECTION OF PROSTATE N/A 09/02/2020   Procedure: TRANSURETHRAL RESECTION OF THE PROSTATE (TURP);  Surgeon: Bjorn Pippin, MD;   Location: [facility];  Service: Urology;  Laterality: N/A;   Social History:   reports that he has never smoked. He has never used smokeless tobacco. He reports that he does not currently use alcohol. He reports that he does not use drugs.  History reviewed. No pertinent family history.  Medications: Patient's Medications  New Prescriptions   No medications on file  Previous Medications   ATORVASTATIN (LIPITOR) 40 MG TABLET    Take 1 tablet (40 mg total) by mouth daily.   B-D ULTRAFINE III SHORT PEN 31G X 8 MM MISC    SMARTSIG:1 Each SUB-Q Daily   CARBIDOPA-LEVODOPA (SINEMET CR) 50-200 MG TABLET    Take 1 tablet by mouth 2 (two) times daily.   CETIRIZINE (ALLERGY, CETIRIZINE,) 10 MG TABLET    Take 1 tablet (10 mg total) by mouth daily.   DICLOFENAC SODIUM (VOLTAREN) 1 % GEL    Apply 4 g topically daily as needed (knee pain).   DULOXETINE (CYMBALTA) 30 MG CAPSULE    TAKE 1 CAPSULE BY MOUTH 2 TIMES DAILY.   FINASTERIDE (PROSCAR) 5 MG TABLET    Take 1 tablet (5 mg total) by mouth daily.   FLUTICASONE (FLONASE) 50 MCG/ACT NASAL SPRAY    Place 1 spray into both nostrils daily as needed for  allergies or rhinitis.   GABAPENTIN (NEURONTIN) 300 MG CAPSULE    Take 1 capsule (300 mg total) by mouth 3 (three) times daily.   INSULIN GLARGINE (BASAGLAR KWIKPEN) 100 UNIT/ML    Inject 35 Units into the skin in the morning.   MULTIPLE MINERALS PO    Take 1 tablet by mouth daily.   POLYETHYLENE GLYCOL (MIRALAX / GLYCOLAX) 17 G PACKET    Take 17 g by mouth at bedtime.  Modified Medications   No medications on file  Discontinued Medications   No medications on file    Physical Exam:  Vitals:   12/24/20 1530  BP: 120/62  Pulse: 70  Temp: 97.7 F (36.5 C)  SpO2: 98%  Weight: 204 lb 6.4 oz (92.7 kg)  Height: 5\' 10"  (1.778 m)   Body mass index is 29.33 kg/m. Wt Readings from Last 3 Encounters:  12/24/20 204 lb 6.4 oz (92.7 kg)  10/09/20 200 lb 6.4 oz (90.9 kg)  09/25/20 200 lb (90.7 kg)     Physical Exam Vitals and nursing note reviewed.  Constitutional:      Appearance: Normal appearance.  Cardiovascular:     Rate and Rhythm: Normal rate and regular rhythm.  Pulmonary:     Effort: Pulmonary effort is normal.     Breath sounds: Normal breath sounds.  Neurological:     Mental Status: He is alert.    Labs reviewed: Basic Metabolic Panel: Recent Labs    05/01/20 0705 08/25/20 1336 09/03/20 0451  NA 139 140 139  K 4.4 4.2 4.3  CL 105 107 105  CO2 27 27 25   GLUCOSE 92 181* 228*  BUN 20 21 18   CREATININE 0.84 0.72 0.75  CALCIUM 9.1 9.2 8.8*  TSH 1.42  --   --    Liver Function Tests: Recent Labs    05/01/20 0705  AST 19  ALT 16  BILITOT 0.7  PROT 6.6   No results for input(s): LIPASE, AMYLASE in the last 8760 hours. No results for input(s): AMMONIA in the last 8760 hours. CBC: Recent Labs    05/01/20 0705 08/25/20 1336 09/02/20 1031 09/03/20 0451  WBC 4.7 5.6  --   --   NEUTROABS 2,792  --   --   --   HGB 15.1 14.6 13.6 13.0  HCT 44.8 43.2 40.7 39.3  MCV 93.9 94.1  --   --   PLT 237 229  --   --    Lipid Panel: Recent Labs    05/01/20 0705  CHOL 116  HDL 43  LDLCALC 57  TRIG 78  CHOLHDL 2.7   TSH: Recent Labs    05/01/20 0705  TSH 1.42   A1C: Lab Results  Component Value Date   HGBA1C 6.5 (H) 12/17/2020     Assessment/Plan 1. Type 2 diabetes mellitus with diabetic neuropathy, without long-term current use of insulin (HCC) Continue with glargine as before plan to recheck A1c in 6 months - Hemoglobin A1c; Future  2. Mixed hyperlipidemia Lipids at goal continue with atorvastatin 40 mg  3. Type 2 diabetes mellitus with neurological complications (HCC) Increase gabapentin from 300 3 times daily to 600 p.m. in a.m. and 300 midday if all goes well increase midday dose to 600 as well.  We will begin to taper Cymbalta    05/03/20, MD [facility] [phone]

## 2020-12-30 ENCOUNTER — Other Ambulatory Visit: Payer: Self-pay | Admitting: Family Medicine

## 2020-12-30 ENCOUNTER — Telehealth: Payer: Self-pay | Admitting: Family Medicine

## 2020-12-30 DIAGNOSIS — Z981 Arthrodesis status: Secondary | ICD-10-CM

## 2020-12-30 NOTE — Telephone Encounter (Signed)
Stephen Burns called to confirm that he would like a referral to the therapy dept at [facility] for PT/massage therapy that he discussed with Dr Hyacinth Meeker at last visit.  I have info for Stephen Burns with [facility](at [facility]) to send that referral over via fax [phone] when complete  Thanks, Rosezella [place]

## 2021-01-13 ENCOUNTER — Other Ambulatory Visit: Payer: Self-pay | Admitting: Nurse Practitioner

## 2021-01-13 DIAGNOSIS — E782 Mixed hyperlipidemia: Secondary | ICD-10-CM

## 2021-01-22 ENCOUNTER — Other Ambulatory Visit: Payer: Self-pay

## 2021-01-22 ENCOUNTER — Emergency Department (HOSPITAL_BASED_OUTPATIENT_CLINIC_OR_DEPARTMENT_OTHER): Payer: Medicare Other | Admitting: Radiology

## 2021-01-22 ENCOUNTER — Emergency Department (HOSPITAL_BASED_OUTPATIENT_CLINIC_OR_DEPARTMENT_OTHER)
Admission: EM | Admit: 2021-01-22 | Discharge: 2021-01-22 | Disposition: A | Discharge status: Home / Self Care | Payer: Medicare Other | Attending: Emergency Medicine | Admitting: Emergency Medicine

## 2021-01-22 ENCOUNTER — Encounter (HOSPITAL_BASED_OUTPATIENT_CLINIC_OR_DEPARTMENT_OTHER): Payer: Self-pay

## 2021-01-22 DIAGNOSIS — Z20822 Contact with and (suspected) exposure to covid-19: Secondary | ICD-10-CM | POA: Diagnosis not present

## 2021-01-22 DIAGNOSIS — E1140 Type 2 diabetes mellitus with diabetic neuropathy, unspecified: Secondary | ICD-10-CM | POA: Insufficient documentation

## 2021-01-22 DIAGNOSIS — E1149 Type 2 diabetes mellitus with other diabetic neurological complication: Secondary | ICD-10-CM | POA: Diagnosis not present

## 2021-01-22 DIAGNOSIS — R531 Weakness: Secondary | ICD-10-CM | POA: Diagnosis not present

## 2021-01-22 DIAGNOSIS — Z794 Long term (current) use of insulin: Secondary | ICD-10-CM | POA: Diagnosis not present

## 2021-01-22 DIAGNOSIS — R059 Cough, unspecified: Secondary | ICD-10-CM

## 2021-01-22 DIAGNOSIS — J101 Influenza due to other identified influenza virus with other respiratory manifestations: Secondary | ICD-10-CM | POA: Insufficient documentation

## 2021-01-22 DIAGNOSIS — R509 Fever, unspecified: Secondary | ICD-10-CM | POA: Diagnosis present

## 2021-01-22 DIAGNOSIS — J111 Influenza due to unidentified influenza virus with other respiratory manifestations: Secondary | ICD-10-CM

## 2021-01-22 LAB — RESP PANEL BY RT-PCR (FLU A&B, COVID) ARPGX2
Influenza A by PCR: POSITIVE — AB
Influenza B by PCR: NEGATIVE
SARS Coronavirus 2 by RT PCR: NEGATIVE

## 2021-01-22 MED ORDER — ACETAMINOPHEN 325 MG PO TABS
650.0000 mg | ORAL_TABLET | Freq: Once | ORAL | Status: AC
  Administered 2021-01-22: 650 mg via ORAL
  Filled 2021-01-22: qty 2

## 2021-01-22 NOTE — ED Provider Notes (Signed)
[facility] EMERGENCY DEPT Provider Note   CSN: [id number] Arrival date & time: 01/22/21  1827     History Chief Complaint  Patient presents with   Fever    Stephen Burns is a 80 y.o. male.  80 year old male who presents with 2 days of fever, headache, cough and generalized weakness.  Denies any urinary symptoms.  Has not been short of breath.  No vomiting or diarrhea.  Has had a scratchy throat.  Some congestion noted.  Using over-the-counter medication without relief.      Past Medical History:  Diagnosis Date   Arthritis    Complication of anesthesia    Difficult to wake up   Diabetes mellitus without complication (HCC)    Headache    History of kidney stones    Pulmonary embolism (HCC)    After shoulder surgery   Vertigo     Patient Active Problem List   Diagnosis Date Noted   Type 2 diabetes mellitus with neurological complications (HCC) 12/25/2020   Temporomandibular joint (TMJ) pain 09/25/2020   Osteoarthritis, multiple sites 09/25/2020   Cerumen impaction 09/25/2020   History of lumbar spinal fusion 04/24/2020   BPH (benign prostatic hyperplasia) 04/24/2020   Slow transit constipation 04/24/2020   Right renal stone 04/24/2020   Type 2 diabetes mellitus with diabetic neuropathy, unspecified (HCC) 04/24/2020   Hyperlipidemia 04/24/2020   BPPV (benign paroxysmal positional vertigo), bilateral 04/24/2020   S/P shoulder replacement, right 04/24/2020   History of pulmonary embolism 04/24/2020   Allergic rhinitis 04/24/2020    Past Surgical History:  Procedure Laterality Date   ADENOIDECTOMY Bilateral    FINGER SURGERY     Left middle   FOOT SURGERY     forehead surgery     L4-L5 fusion     LITHOTRIPSY     Prostrate     Frozen   SHOULDER SURGERY     Complete replacement to right shoulder   TONSILLECTOMY     TRANSURETHRAL RESECTION OF PROSTATE N/A 09/02/2020   Procedure: TRANSURETHRAL RESECTION OF THE PROSTATE (TURP);  Surgeon: Bjorn Pippin, MD;  Location: [facility];  Service: Urology;  Laterality: N/A;       History reviewed. No pertinent family history.  Social History   Tobacco Use   Smoking status: Never   Smokeless tobacco: Never  Vaping Use   Vaping Use: Never used  Substance Use Topics   Alcohol use: Not Currently   Drug use: Never    Home Medications Prior to Admission medications   Medication Sig Start Date End Date Taking? Authorizing Provider  atorvastatin (LIPITOR) 40 MG tablet TAKE 1 TABLET BY MOUTH EVERY DAY 01/13/21   Frederica Kuster, MD  B-D ULTRAFINE III SHORT PEN 31G X 8 MM MISC SMARTSIG:1 Each SUB-Q Daily 10/09/20   Mast, Man X, NP  carbidopa-levodopa (SINEMET CR) 50-200 MG tablet Take 1 tablet by mouth 2 (two) times daily. 04/24/20   Mast, Man X, NP  cetirizine (ALLERGY, CETIRIZINE,) 10 MG tablet Take 1 tablet (10 mg total) by mouth daily. Patient taking differently: Take 10 mg by mouth in the morning. 04/24/20   Mast, Man X, NP  diclofenac Sodium (VOLTAREN) 1 % GEL Apply 4 g topically daily as needed (knee pain). 08/05/20   [provider]  DULoxetine (CYMBALTA) 30 MG capsule TAKE 1 CAPSULE BY MOUTH 2 TIMES DAILY. 11/26/20   Mast, Man X, NP  finasteride (PROSCAR) 5 MG tablet Take 1 tablet (5 mg total) by mouth daily.  Patient taking differently: Take 5 mg by mouth in the morning. 04/24/20   Mast, Man X, NP  fluticasone (FLONASE) 50 MCG/ACT nasal spray Place 1 spray into both nostrils daily as needed for allergies or rhinitis.    [provider]  gabapentin (NEURONTIN) 300 MG capsule Take 1 capsule (300 mg total) by mouth 3 (three) times daily. 04/24/20   Mast, Man X, NP  Insulin Glargine (BASAGLAR KWIKPEN) 100 UNIT/ML Inject 35 Units into the skin in the morning. 10/09/20   Mast, Man X, NP  MULTIPLE MINERALS PO Take 1 tablet by mouth daily.    [provider]  polyethylene glycol (MIRALAX / GLYCOLAX) 17 g packet Take 17 g by mouth at bedtime.    [provider]     Allergies    Sulfa antibiotics  Review of Systems   Review of Systems  All other systems reviewed and are negative.  Physical Exam Updated Vital Signs BP (!) 167/69 (BP Location: Right Arm)    Pulse 88    Temp 99.7 F (37.6 C) (Oral)    Resp 20    Ht 1.778 m (5\' 10" )    Wt 90.7 kg    SpO2 95%    BMI 28.70 kg/m   Physical Exam Vitals and nursing note reviewed.  Constitutional:      General: He is not in acute distress.    Appearance: Normal appearance. He is well-developed. He is not toxic-appearing.  HENT:     Head: Normocephalic and atraumatic.  Eyes:     General: Lids are normal.     Conjunctiva/sclera: Conjunctivae normal.     Pupils: Pupils are equal, round, and reactive to light.  Neck:     Thyroid: No thyroid mass.     Trachea: No tracheal deviation.  Cardiovascular:     Rate and Rhythm: Normal rate and regular rhythm.     Heart sounds: Normal heart sounds. No murmur heard.   No gallop.  Pulmonary:     Effort: Pulmonary effort is normal. No respiratory distress.     Breath sounds: Normal breath sounds. No stridor. No decreased breath sounds, wheezing, rhonchi or rales.  Abdominal:     General: There is no distension.     Palpations: Abdomen is soft.     Tenderness: There is no abdominal tenderness. There is no rebound.  Musculoskeletal:        General: No tenderness. Normal range of motion.     Cervical back: Normal range of motion and neck supple.  Skin:    General: Skin is warm and dry.     Findings: No abrasion or rash.  Neurological:     Mental Status: He is alert and oriented to person, place, and time. Mental status is at baseline.     GCS: GCS eye subscore is 4. GCS verbal subscore is 5. GCS motor subscore is 6.     Cranial Nerves: No cranial nerve deficit.     Sensory: No sensory deficit.     Motor: Motor function is intact.  Psychiatric:        Attention and Perception: Attention normal.        Speech: Speech normal.        Behavior: Behavior  normal.    ED Results / Procedures / Treatments   Labs (all labs ordered are listed, but only abnormal results are displayed) Labs Reviewed  RESP PANEL BY RT-PCR (FLU A&B, COVID) [id number]    EKG None  Radiology No  results found.  Procedures Procedures   Medications Ordered in ED Medications  acetaminophen (TYLENOL) tablet 650 mg (has no administration in time range)    ED Course  I have reviewed the triage vital signs and the nursing notes.  Pertinent labs & imaging results that were available during my care of the patient were reviewed by me and considered in my medical decision making (see chart for details).    MDM Rules/Calculators/A&P                         Chest x-ray without acute findings here.  He is flu a positive.  Taking oral liquids okay.  He denies any dyspnea.  Will discharge home with return precautions    Final Clinical Impression(s) / ED Diagnoses Final diagnoses:  Cough    Rx / DC Orders ED Discharge Orders     None        Lorre Nick, MD 01/22/21 2036

## 2021-01-22 NOTE — ED Triage Notes (Signed)
Fever, headache, cough, generalized weakness  x2 days

## 2021-02-03 ENCOUNTER — Other Ambulatory Visit: Payer: Self-pay | Admitting: Nurse Practitioner

## 2021-02-03 DIAGNOSIS — Z981 Arthrodesis status: Secondary | ICD-10-CM

## 2021-03-21 ENCOUNTER — Other Ambulatory Visit: Payer: Self-pay | Admitting: Nurse Practitioner

## 2021-04-16 ENCOUNTER — Other Ambulatory Visit: Payer: Medicare Other

## 2021-04-16 ENCOUNTER — Other Ambulatory Visit: Payer: Self-pay

## 2021-04-16 DIAGNOSIS — E1140 Type 2 diabetes mellitus with diabetic neuropathy, unspecified: Secondary | ICD-10-CM

## 2021-04-17 LAB — HEMOGLOBIN A1C
Hgb A1c MFr Bld: 6.5 % of total Hgb — ABNORMAL HIGH (ref <5.7)
Mean Plasma Glucose: 140 mg/dL
eAG (mmol/L): 7.7 mmol/L

## 2021-04-17 LAB — EXTRA SPECIMEN

## 2021-04-22 ENCOUNTER — Non-Acute Institutional Stay (INDEPENDENT_AMBULATORY_CARE_PROVIDER_SITE_OTHER): Payer: Medicare Other | Admitting: Family Medicine

## 2021-04-22 ENCOUNTER — Other Ambulatory Visit: Payer: Self-pay

## 2021-04-22 ENCOUNTER — Encounter: Payer: Self-pay | Admitting: Family Medicine

## 2021-04-22 VITALS — BP 118/74 | HR 85 | Temp 97.5°F | Ht 70.0 in | Wt 201.4 lb

## 2021-04-22 DIAGNOSIS — E1149 Type 2 diabetes mellitus with other diabetic neurological complication: Secondary | ICD-10-CM | POA: Diagnosis not present

## 2021-04-22 DIAGNOSIS — M159 Polyosteoarthritis, unspecified: Secondary | ICD-10-CM

## 2021-04-22 DIAGNOSIS — Z981 Arthrodesis status: Secondary | ICD-10-CM

## 2021-04-22 DIAGNOSIS — E1140 Type 2 diabetes mellitus with diabetic neuropathy, unspecified: Secondary | ICD-10-CM

## 2021-04-22 DIAGNOSIS — Z96612 Presence of left artificial shoulder joint: Secondary | ICD-10-CM

## 2021-04-22 DIAGNOSIS — Z96611 Presence of right artificial shoulder joint: Secondary | ICD-10-CM

## 2021-04-22 DIAGNOSIS — E782 Mixed hyperlipidemia: Secondary | ICD-10-CM

## 2021-04-22 MED ORDER — GABAPENTIN 300 MG PO CAPS: 300.0000 mg | ORAL_CAPSULE | Freq: Three times a day (TID) | ORAL | 1 refills | Status: DC

## 2021-04-22 MED ORDER — AMOXICILLIN 500 MG PO CAPS: 500.0000 mg | ORAL_CAPSULE | Freq: Three times a day (TID) | ORAL | 0 refills | Status: AC

## 2021-04-22 NOTE — Progress Notes (Signed)
? ? ?Provider:  ?Alain Honey, MD ? ?Careteam: ?Patient Care Team: ?Wardell Honour, MD as PCP - General (Family Medicine) ? ?PLACE OF SERVICE:  ?[facility]  ?Advanced Directive information ?  ? ?Allergies  ?Allergen Reactions  ? Sulfa Antibiotics Swelling  ?  Joint swelling  ? ? ?Chief Complaint  ?Patient presents with  ? Medical Management of Chronic Issues  ?  Patient presents today for a 4 month follow-up.  ? Quality Metric Gaps  ?  Urine microalbumin, Hep C screening, pneumonia, zoster, TDAP, COVID  ? ? ? ?HPI: Patient is a 81 y.o. male patient presents today with 2 items of concern.  First he has a dental appointment coming up this week and the dentist questions whether or not he needs to be premedicated with antibiotic.  He does not have any heart lesions that we are aware of but he has had shoulder replacement and incidentally is going to have a knee replacement in the next few weeks.  I believe antibiotics are indicated given these joint replacements therapies. ? ?As noted above he is scheduled for knee replacement the first week or 2 April.  He presents today for a clearance exam for that surgery. ? ?Review of Systems:  ?Review of Systems  ?Constitutional: Negative.   ?Respiratory: Negative.    ?Cardiovascular: Negative.   ?Gastrointestinal: Negative.   ?Genitourinary: Negative.   ?Neurological: Negative.   ?Psychiatric/Behavioral: Negative.    ?All other systems reviewed and are negative. ? ?Past Medical History:  ?Diagnosis Date  ? Arthritis   ? Complication of anesthesia   ? Difficult to wake up  ? Diabetes mellitus without complication ([place])   ? Headache   ? History of kidney stones   ? Pulmonary embolism ([place])   ? After shoulder surgery  ? Vertigo   ? ?Past Surgical History:  ?Procedure Laterality Date  ? ADENOIDECTOMY Bilateral   ? FINGER SURGERY    ? Left middle  ? FOOT SURGERY    ? forehead surgery    ? L4-L5 fusion    ? LITHOTRIPSY    ? Prostrate    ? Frozen  ? SHOULDER SURGERY    ? Complete  replacement to right shoulder  ? TONSILLECTOMY    ? TRANSURETHRAL RESECTION OF PROSTATE N/A 09/02/2020  ? Procedure: TRANSURETHRAL RESECTION OF THE PROSTATE (TURP);  Surgeon: Irine Seal, MD;  Location: [facility];  Service: Urology;  Laterality: N/A;  ? ?Social History: ?  reports that he has never smoked. He has never used smokeless tobacco. He reports that he does not currently use alcohol. He reports that he does not use drugs. ? ?History reviewed. No pertinent family history. ? ?Medications: ?Patient's Medications  ?New Prescriptions  ? AMOXICILLIN (AMOXIL) 500 MG CAPSULE    Take 1 capsule (500 mg total) by mouth 3 (three) times daily for 10 days. Take 4 cap 2 hrs prior to dental appt  ?Previous Medications  ? ATORVASTATIN (LIPITOR) 40 MG TABLET    TAKE 1 TABLET BY MOUTH EVERY DAY  ? B-D ULTRAFINE III SHORT PEN 31G X 8 MM MISC    SMARTSIG:1 Each SUB-Q Daily  ? CARBIDOPA-LEVODOPA (SINEMET CR) 50-200 MG TABLET    TAKE 1 TABLET BY MOUTH TWICE A DAY  ? CETIRIZINE (ALLERGY, CETIRIZINE,) 10 MG TABLET    Take 1 tablet (10 mg total) by mouth daily.  ? DICLOFENAC SODIUM (VOLTAREN) 1 % GEL    Apply 4 g topically daily as needed (knee pain).  ?  DULOXETINE (CYMBALTA) 30 MG CAPSULE    TAKE 1 CAPSULE BY MOUTH 2 TIMES DAILY.  ? FINASTERIDE (PROSCAR) 5 MG TABLET    TAKE 1 TABLET (5 MG TOTAL) BY MOUTH DAILY.  ? FLUTICASONE (FLONASE) 50 MCG/ACT NASAL SPRAY    Place 1 spray into both nostrils daily as needed for allergies or rhinitis.  ? INSULIN GLARGINE (BASAGLAR KWIKPEN) 100 UNIT/ML    Inject 35 Units into the skin in the morning.  ? MULTIPLE MINERALS PO    Take 1 tablet by mouth daily.  ? POLYETHYLENE GLYCOL (MIRALAX / GLYCOLAX) 17 G PACKET    Take 17 g by mouth at bedtime.  ?Modified Medications  ? Modified Medication Previous Medication  ? GABAPENTIN (NEURONTIN) 300 MG CAPSULE gabapentin (NEURONTIN) 300 MG capsule  ?    Take 1 capsule (300 mg total) by mouth 3 (three) times daily. Take 2 cap in AM and 2 cap in PM and 1 cap in  afternoon    TAKE 1 CAPSULE BY MOUTH THREE TIMES A DAY  ?Discontinued Medications  ? No medications on file  ? ? ?Physical Exam: ? ?Vitals:  ? 04/22/21 1502  ?BP: 118/74  ?Pulse: 85  ?Temp: (!) 97.5 ?F (36.4 ?C)  ?SpO2: 100%  ?Weight: 201 lb 6.4 oz (91.4 kg)  ?Height: 5\' 10"  (1.778 m)  ? ?Body mass index is 28.9 kg/m?. ?Wt Readings from Last 3 Encounters:  ?04/22/21 201 lb 6.4 oz (91.4 kg)  ?01/22/21 200 lb (90.7 kg)  ?12/24/20 204 lb 6.4 oz (92.7 kg)  ? ? ?Physical Exam ?Vitals and nursing note reviewed.  ?Constitutional:   ?   Appearance: Normal appearance.  ?Cardiovascular:  ?   Rate and Rhythm: Normal rate and regular rhythm.  ?Pulmonary:  ?   Effort: Pulmonary effort is normal.  ?   Breath sounds: Normal breath sounds.  ?Neurological:  ?   General: No focal deficit present.  ?   Mental Status: He is alert and oriented to person, place, and time.  ? ? ?Labs reviewed: ?Basic Metabolic Panel: ?Recent Labs  ?  05/01/20 ?0705 08/25/20 ?1336 09/03/20 ?0451  ?NA 139 140 139  ?K 4.4 4.2 4.3  ?CL 105 107 105  ?CO2 27 27 25   ?GLUCOSE 92 181* 228*  ?BUN 20 21 18   ?CREATININE 0.84 0.72 0.75  ?CALCIUM 9.1 9.2 8.8*  ?TSH 1.42  --   --   ? ?Liver Function Tests: ?Recent Labs  ?  05/01/20 ?0705  ?AST 19  ?ALT 16  ?BILITOT 0.7  ?PROT 6.6  ? ?No results for input(s): LIPASE, AMYLASE in the last 8760 hours. ?No results for input(s): AMMONIA in the last 8760 hours. ?CBC: ?Recent Labs  ?  05/01/20 ?0705 08/25/20 ?1336 09/02/20 ?1031 09/03/20 ?0451  ?WBC 4.7 5.6  --   --   ?NEUTROABS 2,792  --   --   --   ?HGB 15.1 14.6 13.6 13.0  ?HCT 44.8 43.2 40.7 39.3  ?MCV 93.9 94.1  --   --   ?PLT 237 229  --   --   ? ?Lipid Panel: ?Recent Labs  ?  05/01/20 ?0705  ?CHOL 116  ?HDL 43  ?[place] 57  ?TRIG 78  ?CHOLHDL 2.7  ? ?TSH: ?Recent Labs  ?  05/01/20 ?0705  ?TSH 1.42  ? ?A1C: ?Lab Results  ?Component Value Date  ? HGBA1C 6.5 (H) 04/15/2021  ? ? ? ?Assessment/Plan ? ?1. Status post replacement of left shoulder joint ?Shoulder joint is  working well.  I believe 1 dose of amoxicillin prior to his dental visit will help prevent possible infection and that has been called to the pharmacy ? ?2. Mixed hyperlipidemia ?Patient continues to take atorvastatin 40 mg.  Last cholesterol check was 1 year ago and repeat labs will be ordered today.  He tolerates statin without problems ? ?3. Type 2 diabetes mellitus with diabetic neuropathy, without long-term current use of insulin ([place]) ?Only medication includes basal insulin.  A1c done last week was 6.5.  Continue same dose ? ?4. History of lumbar spinal fusion ?Denies back pain.  He continues to have some neuropathy that is likely related but could also be result of diabetes ? ? ? ?6. Primary osteoarthritis involving multiple joints ?For total knee replacement next month and has had shoulder replacement.  This knee replacement was necessitated by recent fall with torn meniscus ? ? ? ? ?Alain Honey, MD ?[facility] ?[phone]  ? ?

## 2021-04-23 NOTE — Patient Instructions (Signed)
Call to schedule a 4 month with Dr.Miller. ?

## 2021-04-28 ENCOUNTER — Other Ambulatory Visit: Payer: Self-pay

## 2021-04-28 ENCOUNTER — Other Ambulatory Visit: Payer: Medicare Other

## 2021-04-29 LAB — COMPLETE METABOLIC PANEL WITH GFR
AG Ratio: 1.6 (calc) (ref 1.0–2.5)
ALT: 21 U/L (ref 9–46)
AST: 17 U/L (ref 10–35)
Albumin: 4.3 g/dL (ref 3.6–5.1)
Alkaline phosphatase (APISO): 83 U/L (ref 35–144)
BUN/Creatinine Ratio: 30 (calc) — ABNORMAL HIGH (ref 6–22)
BUN: 27 mg/dL — ABNORMAL HIGH (ref 7–25)
CO2: 28 mmol/L (ref 20–32)
Calcium: 9.9 mg/dL (ref 8.6–10.3)
Chloride: 105 mmol/L (ref 98–110)
Creat: 0.91 mg/dL (ref 0.70–1.28)
Globulin: 2.7 g/dL (calc) (ref 1.9–3.7)
Glucose, Bld: 121 mg/dL — ABNORMAL HIGH (ref 65–99)
Potassium: 4.7 mmol/L (ref 3.5–5.3)
Sodium: 141 mmol/L (ref 135–146)
Total Bilirubin: 0.8 mg/dL (ref 0.2–1.2)
Total Protein: 7 g/dL (ref 6.1–8.1)
eGFR: 86 mL/min/{1.73_m2} (ref >=60)

## 2021-04-29 LAB — LIPID PANEL
Cholesterol: 154 mg/dL (ref <200)
HDL: 56 mg/dL (ref >=40)
LDL Cholesterol (Calc): 84 mg/dL (calc)
Non-HDL Cholesterol (Calc): 98 mg/dL (calc) (ref <130)
Total CHOL/HDL Ratio: 2.8 (calc) (ref <5.0)
Triglycerides: 56 mg/dL (ref <150)

## 2021-05-09 LAB — BASIC METABOLIC PANEL
BUN: 25 — AB (ref 4–21)
CO2: 22 (ref 13–22)
Chloride: 104 (ref 99–108)
Creatinine: 0.8 (ref 0.6–1.3)
Glucose: 133
Potassium: 4.4 mEq/L (ref 3.5–5.1)
Sodium: 142 (ref 137–147)

## 2021-05-09 LAB — COMPREHENSIVE METABOLIC PANEL
Albumin: 4.3 (ref 3.5–5.0)
Calcium: 10 (ref 8.7–10.7)
eGFR: 89

## 2021-05-11 ENCOUNTER — Telehealth: Payer: Self-pay | Admitting: Orthopedic Surgery

## 2021-05-11 ENCOUNTER — Non-Acute Institutional Stay (SKILLED_NURSING_FACILITY): Payer: Medicare Other | Admitting: Orthopedic Surgery

## 2021-05-11 ENCOUNTER — Encounter: Payer: Self-pay | Admitting: Orthopedic Surgery

## 2021-05-11 VITALS — BP 111/69 | HR 63 | Temp 97.8°F | Ht 70.0 in | Wt 198.8 lb

## 2021-05-11 DIAGNOSIS — M25562 Pain in left knee: Secondary | ICD-10-CM

## 2021-05-11 DIAGNOSIS — G4762 Sleep related leg cramps: Secondary | ICD-10-CM | POA: Diagnosis not present

## 2021-05-11 DIAGNOSIS — G8929 Other chronic pain: Secondary | ICD-10-CM | POA: Diagnosis not present

## 2021-05-11 NOTE — Patient Instructions (Signed)
Please try yellow mustard when cramping occurs- take dime sized amount ? ?May also take gabapentin up to 3x daily ? ? ?

## 2021-05-11 NOTE — Progress Notes (Signed)
? ? ?Careteam: ?Patient Care Team: ?Wardell Honour, MD as PCP - General (Family Medicine) ? ?Seen by: Windell Moulding, AGNP-C ? ?PLACE OF SERVICE:  ?[facility]  ?Advanced Directive information ?Does Patient Have a Medical Advance Directive?: No;Yes, Type of Advance Directive: [place], Does patient want to make changes to medical advance directive?: No - Patient declined ? ?Allergies  ?Allergen Reactions  ? Sulfa Antibiotics Swelling  ?  Joint swelling  ? ? ?Chief Complaint  ?Patient presents with  ? Acute Visit  ?  Patient comes in today with cramps and muscle spasms. Patient stated that Friday morning he felt pain in his left lower leg.   ? ? ? ?HPI: Patient is a 81 y.o. male seen today for acute visit due to lower leg cramps.  ? ?03/30 he had increased right leg cramping at 2-3 am. He walked around the house and symptoms subsided. A few hours later, both legs were cramping  from his hips to feet. 04/01 [place] nurse recommended being seen by provider.He went to [place] walk in clinic. Lab work done, no new prescriptions given. BMP unremarkable. CBC/diff pending. No further incidents of cramping reported. He has a history of leg cramping in the past. Remains on Sinemet and gabapentin for symptom control. Treatment options discussed today. He does not want to try muscle relaxer at this time. Denies chest pain, sob, calf pain or recent injury.  ? ?Scheduled for left knee replacement next week with Dr. Davina Poke.  ? ?Review of Systems:  ?Review of Systems  ?Constitutional:  Negative for chills, fever, malaise/fatigue and weight loss.  ?Respiratory:  Negative for cough, shortness of breath and wheezing.   ?Cardiovascular:  Negative for chest pain and leg swelling.  ?Musculoskeletal:  Positive for back pain, joint pain and myalgias. Negative for falls.  ?Skin: Negative.   ?Neurological:  Positive for tingling. Negative for dizziness, sensory change, weakness and headaches.  ?Psychiatric/Behavioral:  Negative  for depression. The patient is not nervous/anxious.   ? ?Past Medical History:  ?Diagnosis Date  ? Arthritis   ? Complication of anesthesia   ? Difficult to wake up  ? Diabetes mellitus without complication ([place])   ? Headache   ? History of kidney stones   ? Pulmonary embolism ([place])   ? After shoulder surgery  ? Vertigo   ? ?Past Surgical History:  ?Procedure Laterality Date  ? ADENOIDECTOMY Bilateral   ? FINGER SURGERY    ? Left middle  ? FOOT SURGERY    ? forehead surgery    ? L4-L5 fusion    ? LITHOTRIPSY    ? Prostrate    ? Frozen  ? SHOULDER SURGERY    ? Complete replacement to right shoulder  ? TONSILLECTOMY    ? TRANSURETHRAL RESECTION OF PROSTATE N/A 09/02/2020  ? Procedure: TRANSURETHRAL RESECTION OF THE PROSTATE (TURP);  Surgeon: Irine Seal, MD;  Location: [facility];  Service: Urology;  Laterality: N/A;  ? ?Social History: ?  reports that he has never smoked. He has never used smokeless tobacco. He reports that he does not currently use alcohol. He reports that he does not use drugs. ? ?History reviewed. No pertinent family history. ? ?Medications: ?Patient's Medications  ?New Prescriptions  ? No medications on file  ?Previous Medications  ? ATORVASTATIN (LIPITOR) 40 MG TABLET    TAKE 1 TABLET BY MOUTH EVERY DAY  ? B-D ULTRAFINE III SHORT PEN 31G X 8 MM MISC    SMARTSIG:1 Each SUB-Q  Daily  ? CARBIDOPA-LEVODOPA (SINEMET CR) 50-200 MG TABLET    TAKE 1 TABLET BY MOUTH TWICE A DAY  ? CETIRIZINE (ALLERGY, CETIRIZINE,) 10 MG TABLET    Take 1 tablet (10 mg total) by mouth daily.  ? DICLOFENAC SODIUM (VOLTAREN) 1 % GEL    Apply 4 g topically daily as needed (knee pain).  ? FLUTICASONE (FLONASE) 50 MCG/ACT NASAL SPRAY    Place 1 spray into both nostrils daily as needed for allergies or rhinitis.  ? GABAPENTIN (NEURONTIN) 300 MG CAPSULE    Take 1 capsule (300 mg total) by mouth 3 (three) times daily. Take 2 cap in AM and 2 cap in PM and 1 cap in afternoon  ? INSULIN GLARGINE (BASAGLAR KWIKPEN) 100 UNIT/ML    Inject 35  Units into the skin in the morning.  ? MULTIPLE MINERALS PO    Take 1 tablet by mouth daily.  ? POLYETHYLENE GLYCOL (MIRALAX / GLYCOLAX) 17 G PACKET    Take 17 g by mouth at bedtime.  ?Modified Medications  ? No medications on file  ?Discontinued Medications  ? DULOXETINE (CYMBALTA) 30 MG CAPSULE    TAKE 1 CAPSULE BY MOUTH 2 TIMES DAILY.  ? FINASTERIDE (PROSCAR) 5 MG TABLET    TAKE 1 TABLET (5 MG TOTAL) BY MOUTH DAILY.  ? ? ?Physical Exam: ? ?Vitals:  ? 05/11/21 1317  ?BP: 111/69  ?Pulse: 63  ?Temp: 97.8 ?F (36.6 ?C)  ?TempSrc: Temporal  ?SpO2: 98%  ?Weight: 198 lb 12.8 oz (90.2 kg)  ?Height: 5\' 10"  (1.778 m)  ? ?Body mass index is 28.52 kg/m?. ?Wt Readings from Last 3 Encounters:  ?05/11/21 198 lb 12.8 oz (90.2 kg)  ?04/22/21 201 lb 6.4 oz (91.4 kg)  ?01/22/21 200 lb (90.7 kg)  ? ? ?Physical Exam ?Vitals reviewed.  ?Constitutional:   ?   General: He is not in acute distress. ?HENT:  ?   Head: Normocephalic.  ?Eyes:  ?   General:     ?   Right eye: No discharge.     ?   Left eye: No discharge.  ?Cardiovascular:  ?   Rate and Rhythm: Normal rate and regular rhythm.  ?   Pulses: Normal pulses.  ?   Heart sounds: Normal heart sounds.  ?Pulmonary:  ?   Effort: Pulmonary effort is normal. No respiratory distress.  ?   Breath sounds: Normal breath sounds. No wheezing.  ?Abdominal:  ?   General: Bowel sounds are normal. There is no distension.  ?   Palpations: Abdomen is soft.  ?   Tenderness: There is no abdominal tenderness.  ?Musculoskeletal:  ?   Right lower leg: No edema.  ?   Left lower leg: No edema.  ?   Comments: Bilateral lower extremities cool to touch, no deformity or injury present.   ?Skin: ?   General: Skin is warm and dry.  ?   Capillary Refill: Capillary refill takes less than 2 seconds.  ?Neurological:  ?   General: No focal deficit present.  ?   Mental Status: He is alert and oriented to person, place, and time.  ?Psychiatric:     ?   Mood and Affect: Mood normal.     ?   Behavior: Behavior normal.   ? ? ?Labs reviewed: ?Basic Metabolic Panel: ?Recent Labs  ?  08/25/20 ?1336 09/03/20 ?0451 04/28/21 ?[id number]  ?NA 140 139 141  ?K 4.2 4.3 4.7  ?CL 107 105 105  ?CO2 27 25  28  ?GLUCOSE 181* 228* 121*  ?BUN 21 18 27*  ?CREATININE 0.72 0.75 0.91  ?CALCIUM 9.2 8.8* 9.9  ? ?Liver Function Tests: ?Recent Labs  ?  04/28/21 ?0705  ?AST 17  ?ALT 21  ?BILITOT 0.8  ?PROT 7.0  ? ?No results for input(s): LIPASE, AMYLASE in the last 8760 hours. ?No results for input(s): AMMONIA in the last 8760 hours. ?CBC: ?Recent Labs  ?  08/25/20 ?1336 09/02/20 ?1031 09/03/20 ?0451  ?WBC 5.6  --   --   ?HGB 14.6 13.6 13.0  ?HCT 43.2 40.7 39.3  ?MCV 94.1  --   --   ?PLT 229  --   --   ? ?Lipid Panel: ?Recent Labs  ?  04/28/21 ?0705  ?CHOL 154  ?HDL 56  ?[place] 84  ?TRIG 56  ?CHOLHDL 2.8  ? ?TSH: ?No results for input(s): TSH in the last 8760 hours. ?A1C: ?Lab Results  ?Component Value Date  ? HGBA1C 6.5 (H) 04/15/2021  ? ? ? ?Assessment/Plan ?1. Nocturnal leg cramps ?- hx bilateral leg cramps ?- 1 recent episode ?- exam and bmp unremarkable ?- suspect diabetic neuropathy ?- recommend trying yellow mustard when cramp occurs- dime size amount ?- may also take gabapentin 3x/daily ?- recommend trying muscle relaxer in future if symptoms persist ? ?2. Chronic left knee pain ?- left knee arthroplasty scheduled 04/11 ?- followed by Dr. Davina Poke ? ? ?Total time: 21 minutes. Greater than 50% of total time spent doing patient education regarding muscle cramps, diabetic neuropathy, and symptom/medication management regarding cramping.  ? ? ?Next appt: none ?[place], AGNP ? ?[facility] ?[phone]  ?

## 2021-05-11 NOTE — Telephone Encounter (Signed)
error 

## 2021-05-22 ENCOUNTER — Other Ambulatory Visit: Payer: Self-pay

## 2021-05-22 MED ORDER — OXYCODONE-ACETAMINOPHEN 5-325 MG PO TABS: 1.0000 | ORAL_TABLET | ORAL | 0 refills | Status: AC | PRN

## 2021-05-22 NOTE — Telephone Encounter (Signed)
Refill request received from [facility] for Oxycodone 5/325mg  tablet oe every 4 hours as needed for 7 days days for pain. #30. ? ?Medication pended and sent to Mast, Man X, NP for approval ?

## 2021-05-26 ENCOUNTER — Encounter: Payer: Self-pay | Admitting: Nurse Practitioner

## 2021-05-26 ENCOUNTER — Non-Acute Institutional Stay (SKILLED_NURSING_FACILITY): Payer: Medicare Other | Admitting: Nurse Practitioner

## 2021-05-26 DIAGNOSIS — N200 Calculus of kidney: Secondary | ICD-10-CM

## 2021-05-26 DIAGNOSIS — N400 Enlarged prostate without lower urinary tract symptoms: Secondary | ICD-10-CM

## 2021-05-26 DIAGNOSIS — H8113 Benign paroxysmal vertigo, bilateral: Secondary | ICD-10-CM

## 2021-05-26 DIAGNOSIS — Z86711 Personal history of pulmonary embolism: Secondary | ICD-10-CM

## 2021-05-26 DIAGNOSIS — R911 Solitary pulmonary nodule: Secondary | ICD-10-CM

## 2021-05-26 DIAGNOSIS — R252 Cramp and spasm: Secondary | ICD-10-CM

## 2021-05-26 DIAGNOSIS — J309 Allergic rhinitis, unspecified: Secondary | ICD-10-CM

## 2021-05-26 DIAGNOSIS — E782 Mixed hyperlipidemia: Secondary | ICD-10-CM

## 2021-05-26 DIAGNOSIS — E1149 Type 2 diabetes mellitus with other diabetic neurological complication: Secondary | ICD-10-CM

## 2021-05-26 DIAGNOSIS — M159 Polyosteoarthritis, unspecified: Secondary | ICD-10-CM

## 2021-05-26 DIAGNOSIS — Z96652 Presence of left artificial knee joint: Secondary | ICD-10-CM

## 2021-05-26 DIAGNOSIS — K5901 Slow transit constipation: Secondary | ICD-10-CM

## 2021-05-26 NOTE — Progress Notes (Signed)
?Location:   [facility] ?Nursing Home Room Number: 4155 ?Place of Service:  SNF (31) ?Provider:  Chipper OmanMast, ManXie NP ? ?Stephen KusterMiller, Stephen M, MD ? ?Patient Care Team: ?Stephen KusterMiller, Stephen M, MD as PCP - General (Family Medicine) ? ?Extended Emergency Contact Information ?Primary Emergency Contact: Stephen Burns,Stephen Burns ?Address: [address] ?         [place] [id number] [place] ?Home Phone: [phone] ?Mobile Phone: [phone] ?Relation: Spouse ?Preferred language: English ?Interpreter needed? No ? ?Code Status:  Full Code ?Goals of care: Advanced Directive information ? ?  05/26/2021  ?  3:14 PM  ?Advanced Directives  ?Does Patient Have a Medical Advance Directive? No;Yes  ?Type of Advance Directive Healthcare Power of Attorney  ?Does patient want to make changes to medical advance directive? No - Patient declined  ?Copy of Healthcare Power of Attorney in Chart? No - copy requested  ? ? ? ?Chief Complaint  ?Patient presents with  ? Acute Visit  ?  Medication review following hospital stay  ? ? ?HPI:  ?Pt is a 81 y.o. male seen today for an acute visit for medication review following ED visit ? ED 05/20/21 @ [facility] for vasovagal syncope, CTA no PE, 3.0x2.6 likely benign posteromedial right lower lobe lung nodule. ? S/p left knee arthroplasty 05/19/21, f/u Ortho 05/31/21, cold compress, on Lovenox, percocet.  ?  T2DM insulin dependent, takes insulin, well controlled. Hgb a1c 6.5 04/28/21 ?            Diabetic neuropathy, in feet/toes, takes Cymbalta, Gabapentin ? Leg cramps, prn Flexeril  ?            Allergic rhinitis, takes Zyrtec ?            Constipation, last BM yesterday,  takes MiraLax, Senokot S ?            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  ?            BPH, had urology evaluation, monitor PSA, off Finasteride, Tamsulosin. 09/02/20 Dr. Annabell HowellsWrenn TURP ?            R kidney stone, occasional hematuria, not passed per patient ?            Hx of lumbar spine fusion, with  sciatica, spastic pain in legs occurs mostly in 3 am, takes Gabapentin, Sinemet ?            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 Hyperlipidemia, takes Atorvastatin ?            S/p R shoulder replacement, full ROM with mild discomfort, not as strong as prior. Knee pain. Seeing Ortho ?  ? ? ?Past Medical History:  ?Diagnosis Date  ? Arthritis   ? Complication of anesthesia   ? Difficult to wake up  ? Diabetes mellitus without complication ([facility])   ? Headache   ? History of kidney stones   ? Pulmonary embolism ([facility])   ? After shoulder surgery  ? Vertigo   ? ?Past Surgical History:  ?Procedure Laterality Date  ? ADENOIDECTOMY Bilateral   ? FINGER SURGERY    ? Left middle  ? FOOT SURGERY    ? forehead surgery    ? L4-L5 fusion    ? LITHOTRIPSY    ? Prostrate    ? Frozen  ? SHOULDER SURGERY    ? Complete replacement to right shoulder  ? TONSILLECTOMY    ?  TRANSURETHRAL RESECTION OF PROSTATE N/A 09/02/2020  ? Procedure: TRANSURETHRAL RESECTION OF THE PROSTATE (TURP);  Surgeon: Bjorn Pippin, MD;  Location: [facility];  Service: Urology;  Laterality: N/A;  ? ? ?Allergies  ?Allergen Reactions  ? Sulfa Antibiotics Swelling  ?  Joint swelling  ? ? ?Allergies as of 05/26/2021   ? ?   Reactions  ? Sulfa Antibiotics Swelling  ? Joint swelling  ? ?  ? ?  ?Medication List  ?  ? ?  ? Accurate as of May 26, 2021 11:59 PM. If you have any questions, ask your nurse or doctor.  ?  ?  ? ?  ? ?STOP taking these medications   ? ?cetirizine 10 MG tablet ?Commonly known as: Allergy (Cetirizine) ?Stopped by: Shanisha Lech X Paul Trettin, NP ?  ?diclofenac Sodium 1 % Gel ?Commonly known as: VOLTAREN ?Stopped by: Jalaya Sarver X Gerlad Pelzel, NP ?  ? ?  ? ?TAKE these medications   ? ?acetaminophen 500 MG tablet ?Commonly known as: TYLENOL ?Take 1,000 mg by mouth 4 (four) times daily as needed. ?  ?atorvastatin 40 MG tablet ?Commonly known as: LIPITOR ?TAKE 1 TABLET BY MOUTH EVERY DAY ?  ?B-D ULTRAFINE III SHORT PEN 31G X 8 MM  Misc ?Generic drug: Insulin Pen Needle ?SMARTSIG:1 Each SUB-Q Daily ?  ?Basaglar KwikPen 100 UNIT/ML ?Inject 35 Units into the skin in the morning. ?  ?carbidopa-levodopa 50-200 MG tablet ?Commonly known as: SINEMET CR ?TAKE 1 TABLET BY MOUTH TWICE A DAY ?  ?cyclobenzaprine 10 MG tablet ?Commonly known as: FLEXERIL ?Take 10 mg by mouth 3 (three) times daily as needed for muscle spasms. ?  ?enoxaparin 40 MG/0.4ML injection ?Commonly known as: LOVENOX ?Inject 40 mg into the skin daily. ?  ?fluticasone 50 MCG/ACT nasal spray ?Commonly known as: FLONASE ?Place 1 spray into both nostrils daily as needed for allergies or rhinitis. ?  ?gabapentin 300 MG capsule ?Commonly known as: NEURONTIN ?Take 300 mg by mouth daily. ?What changed: Another medication with the same name was removed. Continue taking this medication, and follow the directions you see here. ?Changed by: Lasalle Abee X Roscoe Witts, NP ?  ?gabapentin 300 MG capsule ?Commonly known as: NEURONTIN ?Take 600 mg by mouth 2 (two) times daily. ?What changed: Another medication with the same name was removed. Continue taking this medication, and follow the directions you see here. ?Changed by: Sole Lengacher X Bertina Guthridge, NP ?  ?MULTIPLE MINERALS PO ?Take 1 tablet by mouth daily. ?  ?ondansetron 4 MG tablet ?Commonly known as: ZOFRAN ?Take 4 mg by mouth every 8 (eight) hours as needed for nausea or vomiting. ?  ?oxyCODONE-acetaminophen 5-325 MG tablet ?Commonly known as: PERCOCET/ROXICET ?Take 1 tablet by mouth every 4 (four) hours as needed for up to 5 days for severe pain. ?  ?polyethylene glycol 17 g packet ?Commonly known as: MIRALAX / GLYCOLAX ?Take 17 g by mouth at bedtime. ?  ?sennosides-docusate sodium 8.6-50 MG tablet ?Commonly known as: SENOKOT-S ?Take 1 tablet by mouth 2 (two) times daily. ?  ? ?  ? ? ?Review of Systems  ?Constitutional:  Negative for activity change, fatigue and fever.  ?HENT:  Positive for hearing loss. Negative for congestion and trouble swallowing.   ?Eyes:  Negative  for visual disturbance.  ?Respiratory:  Negative for cough, shortness of breath and wheezing.   ?Cardiovascular:  Positive for leg swelling. Negative for chest pain and palpitations.  ?Gastrointestinal:  Negative for abdominal pain and constipation.  ?Genitourinary:  Positive for frequency. Negative for difficulty  urinating and urgency.  ?     3x/night  ?Musculoskeletal:  Positive for arthralgias and gait problem.  ?Skin:  Negative for color change.  ?Neurological:  Positive for numbness. Negative for tremors, speech difficulty and headaches.  ?Psychiatric/Behavioral:  Positive for sleep disturbance. Negative for confusion. The patient is not nervous/anxious.   ? ?Immunization History  ?Administered Date(s) Administered  ? Fluad Quad(high Dose 65+) 11/24/2020  ? Influenza, High Dose Seasonal PF 11/24/2020  ? Influenza-Unspecified 01/08/2002, 12/16/2003, 11/07/2007  ? PFIZER Comirnaty(Gray Top)Covid-19 Tri-Sucrose Vaccine 03/16/2019, 04/06/2019, 11/05/2019  ? Research officer, trade [employer] 63yrs & up 06/27/2020, 10/28/2020  ? Td 06/09/2001  ? ?Pertinent  Health Maintenance Due  ?Topic Date Due  ? URINE MICROALBUMIN  05/29/2021  ? INFLUENZA VACCINE  09/08/2021  ? HEMOGLOBIN A1C  10/16/2021  ? OPHTHALMOLOGY EXAM  12/09/2021  ? FOOT EXAM  12/24/2021  ? ? ?  09/03/2020  ?  8:40 AM 09/25/2020  ?  1:25 PM 10/09/2020  ?  2:58 PM 01/22/2021  ?  6:33 PM 04/22/2021  ?  3:04 PM  ?Fall Risk  ?Falls in the past year?  0 0  0  ?Was there an injury with Fall?  0 0  0  ?Fall Risk Category Calculator  0 0  0  ?Fall Risk Category  Low Low  Low  ?Patient Fall Risk Level Moderate fall risk Low fall risk Low fall risk Low fall risk Low fall risk  ?Patient at Risk for Falls Due to  No Fall Risks No Fall Risks  No Fall Risks  ?Fall risk Follow up  Falls evaluation completed Falls evaluation completed  Falls evaluation completed  ? ?Functional Status Survey: ?  ? ?Vitals:  ? 05/26/21 1427  ?BP: 114/72  ?Pulse: 89  ?Resp: 20  ?Temp: 98  ?F (36.7 ?C)  ?SpO2: 94%  ?Weight: 194 lb 6.4 oz (88.2 kg)  ?Height: 5\' 10"  (1.778 Burns)  ? ?Body mass index is 27.89 kg/Burns? ?Physical Exam ?Vitals and nursing note reviewed.  ?Constitutional:   ?   Appearance: Norm

## 2021-05-27 ENCOUNTER — Encounter: Payer: Self-pay | Admitting: Nurse Practitioner

## 2021-05-27 DIAGNOSIS — Z96652 Presence of left artificial knee joint: Secondary | ICD-10-CM | POA: Insufficient documentation

## 2021-05-27 DIAGNOSIS — R911 Solitary pulmonary nodule: Secondary | ICD-10-CM | POA: Insufficient documentation

## 2021-05-27 DIAGNOSIS — R252 Cramp and spasm: Secondary | ICD-10-CM | POA: Insufficient documentation

## 2021-05-27 NOTE — Assessment & Plan Note (Signed)
Hx of lumbar spine fusion, with sciatica, spastic pain in legs occurs mostly in 3 am, takes Gabapentin, Sinemet 

## 2021-05-27 NOTE — Assessment & Plan Note (Signed)
last BM yesterday,  takes MiraLax, Senokot S ?

## 2021-05-27 NOTE — Assessment & Plan Note (Signed)
takes Atorvastatin  

## 2021-05-27 NOTE — Assessment & Plan Note (Addendum)
ED 05/20/21 @ [facility] for vasovagal syncope, CTA no PE, 3.0x2.6 likely benign posteromedial right lower lobe lung nodule.  ? treated by therapist in the past.  ?

## 2021-05-27 NOTE — Assessment & Plan Note (Signed)
ED 05/20/21 @ [facility] for vasovagal syncope, CTA no PE, 3.0x2.6 likely benign posteromedial right lower lobe lung nodule.  May consider pulmonology referral is the patient desires.  ? ?

## 2021-05-27 NOTE — Assessment & Plan Note (Signed)
had urology evaluation, monitor PSA, off Finasteride, Tamsulosin. 09/02/20 Dr. Jeffie Pollock TURP ?

## 2021-05-27 NOTE — Assessment & Plan Note (Signed)
occasional hematuria, not passed per patient ?

## 2021-05-27 NOTE — Assessment & Plan Note (Addendum)
S/p left knee arthroplasty 05/19/21, f/u Ortho 05/31/21, cold compress, on Lovenox, will schedule Percocet 5/325mg  8am, 2pm, 6pm x 5days, will have Pococet 5/325mg  q8hr prn available x 2 weeks. Update CBC/diff, BMP, TSH  ?

## 2021-05-27 NOTE — Assessment & Plan Note (Addendum)
takes insulin, well controlled. Hgb a1c 6.5 04/28/21,  in feet/toes, takes Cymbalta, Gabapentin ?

## 2021-05-27 NOTE — Assessment & Plan Note (Signed)
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

## 2021-05-27 NOTE — Assessment & Plan Note (Signed)
takes Zyrtec. 

## 2021-05-27 NOTE — Assessment & Plan Note (Signed)
prn Flexeril  ?

## 2021-05-29 LAB — CBC AND DIFFERENTIAL
HCT: 37 — AB (ref 41–53)
Hemoglobin: 12.2 — AB (ref 13.5–17.5)
Neutrophils Absolute: 4653
Platelets: 308 K/uL (ref 150–400)
WBC: 6.4

## 2021-05-29 LAB — BASIC METABOLIC PANEL
BUN: 21 (ref 4–21)
CO2: 25 — AB (ref 13–22)
Chloride: 102 (ref 99–108)
Creatinine: 0.7 (ref 0.6–1.3)
Glucose: 171
Potassium: 4.4 mEq/L (ref 3.5–5.1)
Sodium: 136 — AB (ref 137–147)

## 2021-05-29 LAB — COMPREHENSIVE METABOLIC PANEL WITH GFR: Calcium: 8.6 — AB (ref 8.7–10.7)

## 2021-05-29 LAB — CBC: RBC: 3.94 (ref 3.87–5.11)

## 2021-05-29 LAB — TSH: TSH: 2.27 (ref 0.41–5.90)

## 2021-06-02 ENCOUNTER — Non-Acute Institutional Stay (SKILLED_NURSING_FACILITY): Payer: Medicare Other | Admitting: Internal Medicine

## 2021-06-02 ENCOUNTER — Encounter: Payer: Self-pay | Admitting: Internal Medicine

## 2021-06-02 DIAGNOSIS — M159 Polyosteoarthritis, unspecified: Secondary | ICD-10-CM

## 2021-06-02 DIAGNOSIS — Z96652 Presence of left artificial knee joint: Secondary | ICD-10-CM | POA: Diagnosis not present

## 2021-06-02 DIAGNOSIS — Z86711 Personal history of pulmonary embolism: Secondary | ICD-10-CM

## 2021-06-02 DIAGNOSIS — N400 Enlarged prostate without lower urinary tract symptoms: Secondary | ICD-10-CM | POA: Diagnosis not present

## 2021-06-02 DIAGNOSIS — E782 Mixed hyperlipidemia: Secondary | ICD-10-CM

## 2021-06-02 DIAGNOSIS — E1149 Type 2 diabetes mellitus with other diabetic neurological complication: Secondary | ICD-10-CM

## 2021-06-02 NOTE — Progress Notes (Signed)
?Provider:  Einar Crow MD  ?Location:   [facility] ?Nursing Home Room Number: 87 ?Place of Service:  SNF (31) ? ?PCP: Frederica Kuster, MD ?Patient Care Team: ?Frederica Kuster, MD as PCP - General (Family Medicine) ? ?Extended Emergency Contact Information ?Primary Emergency Contact: Brandy,MARGARET ?Address: [address] ?         [place], [place] [address] [place] ?Home Phone: [phone] ?Mobile Phone: [phone] ?Relation: Spouse ?Preferred language: English ?Interpreter needed? No ? ?Code Status: Full Code ?Goals of Care: Advanced Directive information ? ?  06/02/2021  ?  3:45 PM  ?Advanced Directives  ?Does Patient Have a Medical Advance Directive? No  ?Does patient want to make changes to medical advance directive? No - Patient declined  ? ? ? ? ?Chief Complaint  ?Patient presents with  ? New Admit To SNF  ?  Admission to SNF  ? ? ?HPI: Patient is a 81 y.o. male seen today for admission to SNF for Therapy after Left Knee arthroplasty followed by episode of Syncope ? 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 ?He underwent TKA on 04/11 ?Has syncope on 04/12 at home when he was trying to come out of his Car and Passed out Did not hit his head ?In ED CTA was negative for PE ?Troponin was normal ?He was admitted in SNF to monitor and therapy ?Patient has not head any other episode since then ?Pain seems control ?Doing well with therapy Walking almost 100 feet ?Wants to go back to his apartment soon ? ? ?Past Medical History:  ?Diagnosis Date  ? Arthritis   ? Complication of anesthesia   ? Difficult to wake up  ? Diabetes mellitus without complication (HCC)   ? Headache   ? History of kidney stones   ? Pulmonary embolism (HCC)   ? After shoulder surgery  ? Vertigo   ? ?Past Surgical History:  ?Procedure Laterality Date  ? ADENOIDECTOMY Bilateral   ? FINGER SURGERY    ? Left  middle  ? FOOT SURGERY    ? forehead surgery    ? L4-L5 fusion    ? LITHOTRIPSY    ? Prostrate    ? Frozen  ? SHOULDER SURGERY    ? Complete replacement to right shoulder  ? TONSILLECTOMY    ? TRANSURETHRAL RESECTION OF PROSTATE N/A 09/02/2020  ? Procedure: TRANSURETHRAL RESECTION OF THE PROSTATE (TURP);  Surgeon: Bjorn Pippin, MD;  Location: [facility];  Service: Urology;  Laterality: N/A;  ? ? reports that he has never smoked. He has never used smokeless tobacco. He reports that he does not currently use alcohol. He reports that he does not use drugs. ?Social History  ? ?Socioeconomic History  ? Marital status: Married  ?  Spouse name: Not on file  ? Number of children: Not on file  ? Years of education: Not on file  ? Highest education level: Not on file  ?Occupational History  ? Not on file  ?Tobacco Use  ? Smoking status: Never  ? Smokeless tobacco: Never  ?Vaping Use  ? Vaping Use: Never used  ?Substance and Sexual Activity  ? Alcohol use: Not Currently  ? Drug use: Never  ? Sexual activity: Not on file  ?Other Topics Concern  ? Not on file  ?Social History Narrative  ? Not on file  ? ?Social Determinants of Health  ? ?  Financial Resource Strain: Not on file  ?Food Insecurity: Not on file  ?Transportation Needs: Not on file  ?Physical Activity: Not on file  ?Stress: Not on file  ?Social Connections: Not on file  ?Intimate Partner Violence: Not on file  ? ? ?Functional Status Survey: ?  ? ?History reviewed. No pertinent family history. ? ?Health Maintenance  ?Topic Date Due  ? Hepatitis C Screening  Never done  ? Zoster Vaccines- Shingrix (1 of 2) Never done  ? Pneumonia Vaccine 2965+ Years old (1 - PCV) Never done  ? TETANUS/TDAP  06/10/2011  ? URINE MICROALBUMIN  05/29/2021  ? INFLUENZA VACCINE  09/08/2021  ? HEMOGLOBIN A1C  10/16/2021  ? OPHTHALMOLOGY EXAM  12/09/2021  ? FOOT EXAM  12/24/2021  ? COVID-19 Vaccine  Completed  ? HPV VACCINES  Aged Out  ? ? ?Allergies  ?Allergen Reactions  ? Sulfa Antibiotics Swelling  ?   Joint swelling  ? ? ?Allergies as of 06/02/2021   ? ?   Reactions  ? Sulfa Antibiotics Swelling  ? Joint swelling  ? ?  ? ?  ?Medication List  ?  ? ?  ? Accurate as of June 02, 2021  3:46 PM. If you have any questions, ask your nurse or doctor.  ?  ?  ? ?  ? ?acetaminophen 500 MG tablet ?Commonly known as: TYLENOL ?Take 1,000 mg by mouth 4 (four) times daily as needed. ?  ?atorvastatin 40 MG tablet ?Commonly known as: LIPITOR ?TAKE 1 TABLET BY MOUTH EVERY DAY ?  ?B-D ULTRAFINE III SHORT PEN 31G X 8 MM Misc ?Generic drug: Insulin Pen Needle ?SMARTSIG:1 Each SUB-Q Daily ?  ?Basaglar KwikPen 100 UNIT/ML ?Inject 35 Units into the skin in the morning. ?  ?carbidopa-levodopa 50-200 MG tablet ?Commonly known as: SINEMET CR ?TAKE 1 TABLET BY MOUTH TWICE A DAY ?  ?cyclobenzaprine 10 MG tablet ?Commonly known as: FLEXERIL ?Take 10 mg by mouth 3 (three) times daily as needed for muscle spasms. ?  ?enoxaparin 40 MG/0.4ML injection ?Commonly known as: LOVENOX ?Inject 40 mg into the skin daily. ?  ?fluticasone 50 MCG/ACT nasal spray ?Commonly known as: FLONASE ?Place 1 spray into both nostrils daily as needed for allergies or rhinitis. ?  ?gabapentin 300 MG capsule ?Commonly known as: NEURONTIN ?Take 300 mg by mouth daily. ?  ?gabapentin 300 MG capsule ?Commonly known as: NEURONTIN ?Take 600 mg by mouth 2 (two) times daily. ?  ?MULTIPLE MINERALS PO ?Take 1 tablet by mouth daily. ?  ?ondansetron 4 MG tablet ?Commonly known as: ZOFRAN ?Take 4 mg by mouth every 8 (eight) hours as needed for nausea or vomiting. ?  ?oxyCODONE-acetaminophen 5-325 MG tablet ?Commonly known as: PERCOCET/ROXICET ?Take by mouth every 8 (eight) hours as needed for severe pain. ?  ?polyethylene glycol 17 g packet ?Commonly known as: MIRALAX / GLYCOLAX ?Take 17 g by mouth at bedtime. ?  ?sennosides-docusate sodium 8.6-50 MG tablet ?Commonly known as: SENOKOT-S ?Take 1 tablet by mouth 2 (two) times daily. ?  ? ?  ? ? ?Review of Systems  ?Constitutional:   Negative for activity change, appetite change and unexpected weight change.  ?HENT: Negative.    ?Respiratory:  Negative for cough and shortness of breath.   ?Cardiovascular:  Negative for leg swelling.  ?Gastrointestinal:  Negative for constipation.  ?Genitourinary:  Negative for frequency.  ?Musculoskeletal:  Positive for gait problem. Negative for arthralgias and myalgias.  ?Skin: Negative.  Negative for rash.  ?Neurological:  Negative for dizziness  and weakness.  ?Psychiatric/Behavioral:  Negative for confusion and sleep disturbance.   ?All other systems reviewed and are negative. ? ?Vitals:  ? 06/02/21 1538  ?BP: 112/70  ?Pulse: 90  ?Resp: 18  ?Temp: (!) 97.2 ?F (36.2 ?C)  ?SpO2: 96%  ?Weight: 195 lb (88.5 kg)  ?Height: 5\' 10"  (1.778 m)  ? ?Body mass index is 27.98 kg/m? ?Physical Exam ?Vitals reviewed.  ?Constitutional:   ?   Appearance: Normal appearance.  ?HENT:  ?   Head: Normocephalic.  ?   Nose: Nose normal.  ?   Mouth/Throat:  ?   Mouth: Mucous membranes are moist.  ?   Pharynx: Oropharynx is clear.  ?Eyes:  ?   Pupils: Pupils are equal, round, and reactive to light.  ?Cardiovascular:  ?   Rate and Rhythm: Normal rate and regular rhythm.  ?   Pulses: Normal pulses.  ?   Heart sounds: No murmur heard. ?Pulmonary:  ?   Effort: Pulmonary effort is normal. No respiratory distress.  ?   Breath sounds: Normal breath sounds. No rales.  ?Abdominal:  ?   General: Abdomen is flat. Bowel sounds are normal.  ?   Palpations: Abdomen is soft.  ?Musculoskeletal:  ?   Cervical back: Neck supple.  ?   Comments: Left leg Swelling more then right   ?Skin: ?   General: Skin is warm.  ?Neurological:  ?   General: No focal deficit present.  ?   Mental Status: He is alert and oriented to person, place, and time.  ?Psychiatric:     ?   Mood and Affect: Mood normal.     ?   Thought Content: Thought content normal.  ? ? ?Labs reviewed: ?Basic Metabolic Panel: ?Recent Labs  ?  08/25/20 ?1336 09/03/20 ?0451 04/28/21 ?0705  05/09/21 ?0000 05/29/21 ?0000  ?NA 140 139 141 142 136*  ?K 4.2 4.3 4.7 4.4 4.4  ?CL 107 105 105 104 102  ?CO2 27 25 28 22  25*  ?GLUCOSE 181* 228* 121*  --   --   ?BUN 21 18 27* 25* 21  ?CREATININE 0.72 0.75 0.

## 2021-06-03 ENCOUNTER — Other Ambulatory Visit: Payer: Self-pay | Admitting: Orthopedic Surgery

## 2021-06-03 DIAGNOSIS — Z96652 Presence of left artificial knee joint: Secondary | ICD-10-CM

## 2021-06-03 MED ORDER — OXYCODONE-ACETAMINOPHEN 5-325 MG PO TABS: 1.0000 | ORAL_TABLET | ORAL | 0 refills | Status: AC | PRN

## 2021-06-05 ENCOUNTER — Encounter: Payer: Self-pay | Admitting: Nurse Practitioner

## 2021-06-05 ENCOUNTER — Non-Acute Institutional Stay (SKILLED_NURSING_FACILITY): Payer: Medicare Other | Admitting: Nurse Practitioner

## 2021-06-05 DIAGNOSIS — Z86711 Personal history of pulmonary embolism: Secondary | ICD-10-CM

## 2021-06-05 DIAGNOSIS — Z981 Arthrodesis status: Secondary | ICD-10-CM

## 2021-06-05 DIAGNOSIS — K5901 Slow transit constipation: Secondary | ICD-10-CM | POA: Diagnosis not present

## 2021-06-05 DIAGNOSIS — E1149 Type 2 diabetes mellitus with other diabetic neurological complication: Secondary | ICD-10-CM | POA: Diagnosis not present

## 2021-06-05 DIAGNOSIS — H8113 Benign paroxysmal vertigo, bilateral: Secondary | ICD-10-CM

## 2021-06-05 DIAGNOSIS — M159 Polyosteoarthritis, unspecified: Secondary | ICD-10-CM

## 2021-06-05 DIAGNOSIS — R252 Cramp and spasm: Secondary | ICD-10-CM

## 2021-06-05 DIAGNOSIS — J309 Allergic rhinitis, unspecified: Secondary | ICD-10-CM

## 2021-06-05 DIAGNOSIS — E782 Mixed hyperlipidemia: Secondary | ICD-10-CM

## 2021-06-05 DIAGNOSIS — N400 Enlarged prostate without lower urinary tract symptoms: Secondary | ICD-10-CM

## 2021-06-05 DIAGNOSIS — N200 Calculus of kidney: Secondary | ICD-10-CM

## 2021-06-05 NOTE — Assessment & Plan Note (Signed)
Stable,  takes Zyrtec. 

## 2021-06-05 NOTE — Assessment & Plan Note (Signed)
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

## 2021-06-05 NOTE — Assessment & Plan Note (Signed)
treated by therapist in the past.  ?

## 2021-06-05 NOTE — Assessment & Plan Note (Signed)
had urology evaluation, monitor PSA, off Finasteride, Tamsulosin. 09/02/20 Dr. Wrenn TURP ?

## 2021-06-05 NOTE — Assessment & Plan Note (Addendum)
T2DM insulin dependent, takes insulin, well controlled. Hgb a1c 6.5 04/28/21 ?Neuropathic pain/nubness in  feet/toes, takes Cymbalta, Gabapentin ?

## 2021-06-05 NOTE — Assessment & Plan Note (Signed)
Stable, takes MiraLax, Senokot S 

## 2021-06-05 NOTE — Assessment & Plan Note (Signed)
S/p R shoulder replacement, full ROM with mild discomfort, not as strong as prior. Knee pain. Seeing Ortho ? S/p left knee arthroplasty 05/19/21, f/u Ortho 05/31/21, cold compress, on percocet.  ?Ambulates with walker.  ?

## 2021-06-05 NOTE — Assessment & Plan Note (Signed)
prn Flexeril  ?

## 2021-06-05 NOTE — Assessment & Plan Note (Signed)
,   occasional hematuria, not passed per patient ?

## 2021-06-05 NOTE — Progress Notes (Signed)
?Location:   [facility] ?Nursing Home Room Number: 913B/A ?Place of Service:  Home (12) ? ?Provider: Chipper Oman NP ? ?PCP: Frederica Kuster, MD ?Patient Care Team: ?Frederica Kuster, MD as PCP - General (Family Medicine) ? ?Extended Emergency Contact Information ?Primary Emergency Contact: Butterfield,MARGARET ?Address: [address] ?         [place], [place] [address] [place] ?Home Phone: [phone] ?Mobile Phone: [phone] ?Relation: Spouse ?Preferred language: English ?Interpreter needed? No ? ?Code Status: DNR ?Goals of care:  Advanced Directive information ? ?  06/05/2021  ?  1:26 PM  ?Advanced Directives  ?Does Patient Have a Medical Advance Directive? No  ?Does patient want to make changes to medical advance directive? No - Patient declined  ? ? ? ?Allergies  ?Allergen Reactions  ? Sulfa Antibiotics Swelling  ?  Joint swelling  ? ? ?Chief Complaint  ?Patient presents with  ? Discharge Note  ?  Patient is being discharged today  ? ? ?HPI:  ?81 y.o. male with past medical history significant for T2DM, diabetic neuropathy, leg cramps, allergic rhinitis, constipation, BPPV, BPH, lumbar spine fusion, PE, hyperlipidemia, s/p Left knee replacement was admitted to SNF [facility] for therapy follow ED visit 05/20/21 for syncope.  ? The patient has regained physical strength, ADL function, recovered from the left knee arthroplasty surgery. [place].  ? ED 05/20/21 @ [facility] main hospital for vasovagal syncope, CTA no PE, 3.0x2.6 likely benign posteromedial right lower lobe lung nodule. ?            S/p left knee arthroplasty 05/19/21, f/u Ortho 05/31/21, cold compress, on percocet.  ?             T2DM insulin dependent, takes insulin, well controlled. Hgb a1c 6.5 04/28/21 ?            Diabetic neuropathy, in feet/toes, takes Cymbalta, Gabapentin ?            Leg cramps, prn Flexeril  ?            Allergic rhinitis, takes Zyrtec ?            Constipation,  takes MiraLax,  Senokot S ?            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  ?            BPH, had urology evaluation, monitor PSA, off Finasteride, Tamsulosin. 09/02/20 Dr. Annabell Howells TURP ?            R kidney stone, occasional hematuria, not passed per patient ?            Hx of lumbar spine fusion, with sciatica, spastic pain in legs occurs mostly in 3 am, takes Gabapentin, Sinemet ?            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            Hyperlipidemia, takes Atorvastatin ?            S/p R shoulder replacement, full ROM with mild discomfort, not as strong as prior. Knee pain. Seeing Ortho ?             ?  ?  ? ? ? ?Past Medical History:  ?Diagnosis Date  ? Arthritis   ? Complication of anesthesia   ? Difficult to wake up  ? Diabetes mellitus without  complication (HCC)   ? Headache   ? History of kidney stones   ? Pulmonary embolism (HCC)   ? After shoulder surgery  ? Vertigo   ? ? ?Past Surgical History:  ?Procedure Laterality Date  ? ADENOIDECTOMY Bilateral   ? FINGER SURGERY    ? Left middle  ? FOOT SURGERY    ? forehead surgery    ? L4-L5 fusion    ? LITHOTRIPSY    ? Prostrate    ? Frozen  ? SHOULDER SURGERY    ? Complete replacement to right shoulder  ? TONSILLECTOMY    ? TRANSURETHRAL RESECTION OF PROSTATE N/A 09/02/2020  ? Procedure: TRANSURETHRAL RESECTION OF THE PROSTATE (TURP);  Surgeon: Bjorn PippinWrenn, John, MD;  Location: [facility];  Service: Urology;  Laterality: N/A;  ? ? ?  reports that he has never smoked. He has never used smokeless tobacco. He reports that he does not currently use alcohol. He reports that he does not use drugs. ?Social History  ? ?Socioeconomic History  ? Marital status: Married  ?  Spouse name: Not on file  ? Number of children: Not on file  ? Years of education: Not on file  ? Highest education level: Not on file  ?Occupational History  ? Not on file  ?Tobacco Use  ? Smoking status: Never  ? Smokeless tobacco: Never  ?Vaping Use  ? Vaping Use: Never  used  ?Substance and Sexual Activity  ? Alcohol use: Not Currently  ? Drug use: Never  ? Sexual activity: Not on file  ?Other Topics Concern  ? Not on file  ?Social History Narrative  ? Not on file  ? ?Social Determinants of Health  ? ?Financial Resource Strain: Not on file  ?Food Insecurity: Not on file  ?Transportation Needs: Not on file  ?Physical Activity: Not on file  ?Stress: Not on file  ?Social Connections: Not on file  ?Intimate Partner Violence: Not on file  ? ?Functional Status Survey: ?  ? ?Allergies  ?Allergen Reactions  ? Sulfa Antibiotics Swelling  ?  Joint swelling  ? ? ?Pertinent  Health Maintenance Due  ?Topic Date Due  ? URINE MICROALBUMIN  05/29/2021  ? INFLUENZA VACCINE  09/08/2021  ? HEMOGLOBIN A1C  10/16/2021  ? OPHTHALMOLOGY EXAM  12/09/2021  ? FOOT EXAM  12/24/2021  ? ? ?Medications: ?Allergies as of 06/05/2021   ? ?   Reactions  ? Sulfa Antibiotics Swelling  ? Joint swelling  ? ?  ? ?  ?Medication List  ?  ? ?  ? Accurate as of June 05, 2021  5:06 PM. If you have any questions, ask your nurse or doctor.  ?  ?  ? ?  ? ?acetaminophen 500 MG tablet ?Commonly known as: TYLENOL ?Take 1,000 mg by mouth 4 (four) times daily as needed. ?  ?atorvastatin 40 MG tablet ?Commonly known as: LIPITOR ?TAKE 1 TABLET BY MOUTH EVERY DAY ?  ?B-D ULTRAFINE III SHORT PEN 31G X 8 MM Misc ?Generic drug: Insulin Pen Needle ?SMARTSIG:1 Each SUB-Q Daily ?  ?Basaglar KwikPen 100 UNIT/ML ?Inject 35 Units into the skin in the morning. ?  ?carbidopa-levodopa 50-200 MG tablet ?Commonly known as: SINEMET CR ?TAKE 1 TABLET BY MOUTH TWICE A DAY ?  ?cyclobenzaprine 10 MG tablet ?Commonly known as: FLEXERIL ?Take 10 mg by mouth 3 (three) times daily as needed for muscle spasms. ?  ?enoxaparin 40 MG/0.4ML injection ?Commonly known as: LOVENOX ?Inject 40 mg into the skin daily. ?  ?  fluticasone 50 MCG/ACT nasal spray ?Commonly known as: FLONASE ?Place 1 spray into both nostrils daily as needed for allergies or rhinitis. ?   ?gabapentin 300 MG capsule ?Commonly known as: NEURONTIN ?Take 300 mg by mouth daily. ?  ?gabapentin 300 MG capsule ?Commonly known as: NEURONTIN ?Take 600 mg by mouth 2 (two) times daily. ?  ?MULTIPLE MINERALS PO ?Take 1 tablet by mouth daily. ?  ?ondansetron 4 MG tablet ?Commonly known as: ZOFRAN ?Take 4 mg by mouth every 8 (eight) hours as needed for nausea or vomiting. ?  ?oxyCODONE-acetaminophen 5-325 MG tablet ?Commonly known as: PERCOCET/ROXICET ?Take 1 tablet by mouth every 4 (four) hours as needed for up to 14 days for severe pain. ?  ?polyethylene glycol 17 g packet ?Commonly known as: MIRALAX / GLYCOLAX ?Take 17 g by mouth at bedtime. ?  ?sennosides-docusate sodium 8.6-50 MG tablet ?Commonly known as: SENOKOT-S ?Take 1 tablet by mouth 2 (two) times daily. ?  ? ?  ? ? ?Review of Systems  ?Constitutional:  Negative for activity change, fatigue and fever.  ?HENT:  Positive for hearing loss. Negative for congestion and trouble swallowing.   ?Eyes:  Negative for visual disturbance.  ?Respiratory:  Negative for cough, shortness of breath and wheezing.   ?Cardiovascular:  Positive for leg swelling. Negative for chest pain and palpitations.  ?Gastrointestinal:  Negative for abdominal pain and constipation.  ?Genitourinary:  Positive for frequency. Negative for difficulty urinating and urgency.  ?     3x/night  ?Musculoskeletal:  Positive for arthralgias and gait problem.  ?Skin:  Negative for color change.  ?Neurological:  Positive for numbness. Negative for tremors, speech difficulty and headaches.  ?Psychiatric/Behavioral:  Positive for sleep disturbance. Negative for confusion. The patient is not nervous/anxious.   ? ?There were no vitals filed for this visit. ?There is no height or weight on file to calculate BMI. ?Physical Exam ?Vitals and nursing note reviewed.  ?Constitutional:   ?   Appearance: Normal appearance.  ?HENT:  ?   Head: Normocephalic and atraumatic.  ?   Nose: Nose normal.  ?   Mouth/Throat:   ?   Mouth: Mucous membranes are moist.  ?Eyes:  ?   Extraocular Movements: Extraocular movements intact.  ?   Conjunctiva/sclera: Conjunctivae normal.  ?   Pupils: Pupils are equal, round, and reactive to ligh

## 2021-06-05 NOTE — Assessment & Plan Note (Signed)
takes Atorvastatin  

## 2021-06-05 NOTE — Assessment & Plan Note (Signed)
Hx of lumbar spine fusion, with sciatica, spastic pain in legs occurs mostly in 3 am, takes Gabapentin, Sinemet 

## 2021-06-08 ENCOUNTER — Encounter: Payer: Self-pay | Admitting: Nurse Practitioner

## 2021-06-11 ENCOUNTER — Encounter: Payer: Self-pay | Admitting: Emergency Medicine

## 2021-06-11 ENCOUNTER — Ambulatory Visit (INDEPENDENT_AMBULATORY_CARE_PROVIDER_SITE_OTHER): Payer: Medicare Other | Admitting: Emergency Medicine

## 2021-06-11 DIAGNOSIS — R911 Solitary pulmonary nodule: Secondary | ICD-10-CM

## 2021-06-11 NOTE — Assessment & Plan Note (Signed)
Low risk patient based on negative tobacco exposure, negative family history.  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  The characteristics of the nodule are reassuring, rounded and calcified.  He cannot recall whether a nodule was ever mentioned on his prior CT scans of the chest.  We will try to get copies of these to assess.  Based on its size and his asbestos exposure we probably should follow for at least 1-2 years for stability.  If we can establish 2 years of stability on his prior films then we may be able to defer this. ? ?We reviewed your CT scan of the chest today.  There is a partially calcified 3 cm right lower lobe nodule present with benign characteristics. ?We will obtain copies of your prior CT scans of your chest done at outside facilities so we can compare to your most recent scan. ?Depending on stability over time on your prior films we will decide whether we need to follow your right lower lobe nodule further.  Tentatively we will plan to repeat a CT without contrast in April 2024. ?Follow Dr. Delton Coombes in 1 month or next available so we can review any old scans together. ?

## 2021-06-11 NOTE — Patient Instructions (Signed)
We reviewed your CT scan of the chest today.  There is a partially calcified 3 cm right lower lobe nodule present with benign characteristics. ?We will obtain copies of your prior CT scans of your chest done at outside facilities so we can compare to your most recent scan. ?Depending on stability over time on your prior films we will decide whether we need to follow your right lower lobe nodule further.  Tentatively we will plan to repeat a CT without contrast in April 2024. ?Follow Dr. Delton Coombes in 1 month or next available so we can review any old scans together. ?

## 2021-06-11 NOTE — Progress Notes (Signed)
? ?Subjective:  ? ? Patient ID: Stephen Burns, male    DOB: 05-20-1940, 81 y.o.   MRN: [id number] ? ?HPI ?81 year old man, never smoker, history of osteoarthritis, pulmonary embolism following shoulder surgery (2015), diabetes, renal calculi, vertigo. ?He is referred today for evaluation of pulmonary nodule. ?No respiratory sx. Feels well. He had a knee replacement 05/19/2021. Had a syncopal episode post-op and had CT-PA to look for recurrent PE.  ? ? ?CT-PA 05/20/2021 done at [facility].  I have the report available.  This showed aortic calcification.  No evidence of PE.  No mediastinal or hilar adenopathy.  Some bilateral lower lobe linear atelectatic change.  There is a 3.0 x 2.6 cm partially calcified posterior medial right lower lobe pulmonary nodule. ? ? ?Review of Systems ?As per HPI ? ?Past Medical History:  ?Diagnosis Date  ? Arthritis   ? Complication of anesthesia   ? Difficult to wake up  ? Diabetes mellitus without complication ([place])   ? Headache   ? History of kidney stones   ? Pulmonary embolism ([place])   ? After shoulder surgery  ? Vertigo   ?  ? ?History reviewed. No pertinent family history.  ? ?Social History  ? ?Socioeconomic History  ? Marital status: Married  ?  Spouse name: Not on file  ? Number of children: Not on file  ? Years of education: Not on file  ? Highest education level: Not on file  ?Occupational History  ? Not on file  ?Tobacco Use  ? Smoking status: Never  ? Smokeless tobacco: Never  ?Vaping Use  ? Vaping Use: Never used  ?Substance and Sexual Activity  ? Alcohol use: Not Currently  ? Drug use: Never  ? Sexual activity: Not on file  ?Other Topics Concern  ? Not on file  ?Social History Narrative  ? Not on file  ? ?Social Determinants of Health  ? ?Financial Resource Strain: Not on file  ?Food Insecurity: Not on file  ?Transportation Needs: Not on file  ?Physical Activity: Not on file  ?Stress: Not on file  ?Social Connections: Not on file  ?Intimate Partner Violence: Not on file  ?   ?Was in the [place], [place], [place].  He was exposed to asbestos.  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 [place].  ? ? ?Allergies  ?Allergen Reactions  ? Sulfa Antibiotics Swelling  ?  Joint swelling  ?  ? ?Outpatient Medications Prior to Visit  ?Medication Sig Dispense Refill  ? acetaminophen (TYLENOL) 500 MG tablet Take 1,000 mg by mouth 4 (four) times daily as needed.    ? atorvastatin (LIPITOR) 40 MG tablet TAKE 1 TABLET BY MOUTH EVERY DAY 90 tablet 1  ? B-D ULTRAFINE III SHORT PEN 31G X 8 MM MISC SMARTSIG:1 Each SUB-Q Daily 90 each 3  ? carbidopa-levodopa (SINEMET CR) 50-200 MG tablet TAKE 1 TABLET BY MOUTH TWICE A DAY 180 tablet 1  ? enoxaparin (LOVENOX) 40 MG/0.4ML injection Inject 40 mg into the skin daily.    ? fluticasone (FLONASE) 50 MCG/ACT nasal spray Place 1 spray into both nostrils daily as needed for allergies or rhinitis.    ? gabapentin (NEURONTIN) 300 MG capsule Take 300 mg by mouth daily.    ? gabapentin (NEURONTIN) 300 MG capsule Take 600 mg by mouth 2 (two) times daily.    ? Insulin Glargine (BASAGLAR KWIKPEN) 100 UNIT/ML Inject 35 Units into the skin in the morning.  45 mL 1  ? MULTIPLE MINERALS PO Take 1 tablet by mouth daily.    ? oxyCODONE-acetaminophen (PERCOCET/ROXICET) 5-325 MG tablet Take 1 tablet by mouth every 4 (four) hours as needed for up to 14 days for severe pain. 60 tablet 0  ? polyethylene glycol (MIRALAX / GLYCOLAX) 17 g packet Take 17 g by mouth at bedtime.    ? sennosides-docusate sodium (SENOKOT-S) 8.6-50 MG tablet Take 1 tablet by mouth 2 (two) times daily.    ? cyclobenzaprine (FLEXERIL) 10 MG tablet Take 10 mg by mouth 3 (three) times daily as needed for muscle spasms.    ? ondansetron (ZOFRAN) 4 MG tablet Take 4 mg by mouth every 8 (eight) hours as needed for nausea or vomiting.    ? ?No facility-administered medications prior to visit.  ? ? ? ?   ?Objective:  ? Physical Exam ? ?Vitals:  ? 06/11/21 1040  ?BP: (!)  98/58  ?Pulse: 75  ?Temp: 97.7 ?F (36.5 ?C)  ?TempSrc: Oral  ?SpO2: 96%  ?Weight: 195 lb (88.5 kg)  ?Height: 5\' 10"  (1.778 m)  ? ?Gen: Pleasant, well-nourished, in no distress,  normal affect ? ?ENT: No lesions,  mouth clear,  oropharynx clear, no postnasal drip ? ?Neck: No JVD, no stridor ? ?Lungs: No use of accessory muscles, no crackles or wheezing on normal respiration, no wheeze on forced expiration ? ?Cardiovascular: RRR, heart sounds normal, no murmur or gallops, no peripheral edema Anor ? ?Musculoskeletal: No deformities, no cyanosis or clubbing ? ?Neuro: alert, awake, non focal ? ?Skin: Warm, no lesions or rash ?   ?Assessment & Plan:  ?Lung nodule seen on imaging study ?Low risk patient based on negative tobacco exposure, negative family history.  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  The characteristics of the nodule are reassuring, rounded and calcified.  He cannot recall whether a nodule was ever mentioned on his prior CT scans of the chest.  We will try to get copies of these to assess.  Based on its size and his asbestos exposure we probably should follow for at least 1-2 years for stability.  If we can establish 2 years of stability on his prior films then we may be able to defer this. ? ?We reviewed your CT scan of the chest today.  There is a partially calcified 3 cm right lower lobe nodule present with benign characteristics. ?We will obtain copies of your prior CT scans of your chest done at outside facilities so we can compare to your most recent scan. ?Depending on stability over time on your prior films we will decide whether we need to follow your right lower lobe nodule further.  Tentatively we will plan to repeat a CT without contrast in April 2024. ?Follow Dr. Lamonte Sakai in 1 month or next available so we can review any old scans together. ? ? ?Baltazar Apo, MD, PhD ?06/11/2021, 11:14 AM ?[facility] ?[phone] or if no answer before 7:00PM call  [phone] ?For any issues after 7:00PM please call eLink [phone] ? ? ?

## 2021-07-22 ENCOUNTER — Telehealth: Payer: Self-pay

## 2021-07-22 NOTE — Telephone Encounter (Signed)
ATC left detailed VM asking pt to give [place] information on where prior chest Cts were completed.

## 2021-07-23 ENCOUNTER — Encounter: Payer: Self-pay | Admitting: Emergency Medicine

## 2021-07-23 ENCOUNTER — Ambulatory Visit (INDEPENDENT_AMBULATORY_CARE_PROVIDER_SITE_OTHER): Payer: Medicare Other | Admitting: Emergency Medicine

## 2021-07-23 DIAGNOSIS — R911 Solitary pulmonary nodule: Secondary | ICD-10-CM | POA: Diagnosis not present

## 2021-07-23 NOTE — Patient Instructions (Signed)
We will work on getting your prior chest scans from [place] and [place] so we can compare to [place] scan from 05/2021.  If we establish that your pulmonary nodule has been stable for at least 2 years then we will defer any further scans here.  We will tentatively plan to repeat a CT chest in 05/2022 to follow the nodule.  Follow with Dr. Delton Coombes in 12 months or sooner if you have any problems.

## 2021-07-23 NOTE — Assessment & Plan Note (Signed)
3 cm pulmonary nodule with central calcification, rounded, benign characteristics.  Unfortunately I do not have the [place] scans or reports available today.  We will try to get these.  If the nodule has been stable for over 2 years then I do not think we need any serial films.  We will tentatively plan to repeat in April 2024 if I am not able to get any information that would allow [place] to cancel that scan.  We will work on getting your prior chest scans from [place] and [place] so we can compare to [place] scan from 05/2021.  If we establish that your pulmonary nodule has been stable for at least 2 years then we will defer any further scans here.  We will tentatively plan to repeat a CT chest in 05/2022 to follow the nodule.  Follow with Dr. Delton Coombes in 12 months or sooner if you have any problems.

## 2021-07-23 NOTE — Telephone Encounter (Signed)
Thank you :)

## 2021-07-23 NOTE — Progress Notes (Signed)
Subjective:    Patient ID: Stephen Burns, male    DOB: Nov 30, 1940, 81 y.o.   MRN: [id number]  HPI 81 year old man, never smoker, history of osteoarthritis, pulmonary embolism following shoulder surgery (2015), diabetes, renal calculi, vertigo. He is referred today for evaluation of pulmonary nodule. No respiratory sx. Feels well. He had a knee replacement 05/19/2021. Had a syncopal episode post-op and had CT-PA to look for recurrent PE.    CT-PA 05/20/2021 done at [facility].  I have the report available.  This showed aortic calcification.  No evidence of PE.  No mediastinal or hilar adenopathy.  Some bilateral lower lobe linear atelectatic change.  There is a 3.0 x 2.6 cm partially calcified posterior medial right lower lobe pulmonary nodule.  ROV 07/23/21 --follow-up visit 81 year old gentleman, never smoker, low risk for primary lung cancer.  I saw him in May 2023 for an abnormal CT scan of the chest that shows partially calcified 3 cm right lower lobe pulmonary nodule with benign characteristics.  He has had prior CT scans in [place] but it will have those reports yet.  We will work on getting them.  Tentative plan is to repeat his scan in 05/2022 unless we can establish a history of stability on his old scans.  Today he reports   Review of Systems As per HPI  Past Medical History:  Diagnosis Date   Arthritis    Complication of anesthesia    Difficult to wake up   Diabetes mellitus without complication (HCC)    Headache    History of kidney stones    Pulmonary embolism (HCC)    After shoulder surgery   Vertigo      No family history on file.   Social History   Socioeconomic History   Marital status: Married    Spouse name: Not on file   Number of children: Not on file   Years of education: Not on file   Highest education level: Not on file  Occupational History   Not on file  Tobacco Use   Smoking status: Never   Smokeless tobacco: Never  Vaping Use   Vaping Use:  Never used  Substance and Sexual Activity   Alcohol use: Not Currently   Drug use: Never   Sexual activity: Not on file  Other Topics Concern   Not on file  Social History Narrative   Not on file   Social Determinants of Health   Financial Resource Strain: Not on file  Food Insecurity: Not on file  Transportation Needs: Not on file  Physical Activity: Not on file  Stress: Not on file  Social Connections: Not on file  Intimate Partner Violence: Not on file    Was in the [place], [place], [place].  He was exposed to asbestos.  Has worked as an Designer, fashion/clothing in industrial work Has lived in [place], [place]. [place].    Allergies  Allergen Reactions   Sulfa Antibiotics Swelling    Joint swelling     Outpatient Medications Prior to Visit  Medication Sig Dispense Refill   acetaminophen (TYLENOL) 500 MG tablet Take 1,000 mg by mouth 4 (four) times daily as needed.     atorvastatin (LIPITOR) 40 MG tablet TAKE 1 TABLET BY MOUTH EVERY DAY 90 tablet 1   B-D ULTRAFINE III SHORT PEN 31G X 8 MM MISC SMARTSIG:1 Each SUB-Q Daily 90 each 3   carbidopa-levodopa (SINEMET CR) 50-200 MG tablet TAKE 1 TABLET BY MOUTH TWICE A DAY  180 tablet 1   fluticasone (FLONASE) 50 MCG/ACT nasal spray Place 1 spray into both nostrils daily as needed for allergies or rhinitis.     gabapentin (NEURONTIN) 300 MG capsule Take 300 mg by mouth daily.     gabapentin (NEURONTIN) 300 MG capsule Take 600 mg by mouth 2 (two) times daily.     Insulin Glargine (BASAGLAR KWIKPEN) 100 UNIT/ML Inject 35 Units into the skin in the morning. 45 mL 1   MULTIPLE MINERALS PO Take 1 tablet by mouth daily.     polyethylene glycol (MIRALAX / GLYCOLAX) 17 g packet Take 17 g by mouth at bedtime.     sennosides-docusate sodium (SENOKOT-S) 8.6-50 MG tablet Take 1 tablet by mouth 2 (two) times daily.     enoxaparin (LOVENOX) 40 MG/0.4ML injection Inject 40 mg into the skin daily.     No facility-administered medications prior  to visit.        Objective:   Physical Exam  Vitals:   07/23/21 0945  BP: 130/72  Pulse: 87  Temp: 97.9 F (36.6 C)  TempSrc: Oral  SpO2: 94%  Weight: 201 lb 6.4 oz (91.4 kg)  Height: 5\' 11"  (1.803 m)   Gen: Pleasant, well-nourished, in no distress,  normal affect  ENT: No lesions,  mouth clear,  oropharynx clear, no postnasal drip  Neck: No JVD, no stridor  Lungs: No use of accessory muscles, no crackles or wheezing on normal respiration, no wheeze on forced expiration  Cardiovascular: RRR, heart sounds normal, no murmur or gallops, no peripheral edema Anor  Musculoskeletal: No deformities, no cyanosis or clubbing  Neuro: alert, awake, non focal  Skin: Warm, no lesions or rash    Assessment & Plan:  Lung nodule seen on imaging study 3 cm pulmonary nodule with central calcification, rounded, benign characteristics.  Unfortunately I do not have the [place] scans or reports available today.  We will try to get these.  If the nodule has been stable for over 2 years then I do not think we need any serial films.  We will tentatively plan to repeat in April 2024 if I am not able to get any information that would allow May 2024 to cancel that scan.  We will work on getting your prior chest scans from [place] and [place] so we can compare to [place] scan from 05/2021.  If we establish that your pulmonary nodule has been stable for at least 2 years then we will defer any further scans here.  We will tentatively plan to repeat a CT chest in 05/2022 to follow the nodule.  Follow with Dr. 06/2022 in 12 months or sooner if you have any problems.    Delton Coombes, MD, PhD 07/23/2021, 10:17 AM [facility] [phone] or if no answer before 7:00PM call [phone] For any issues after 7:00PM please call eLink [phone]

## 2021-08-12 ENCOUNTER — Other Ambulatory Visit: Payer: Self-pay | Admitting: Family Medicine

## 2021-08-12 ENCOUNTER — Telehealth: Payer: Self-pay

## 2021-08-12 DIAGNOSIS — E1149 Type 2 diabetes mellitus with other diabetic neurological complication: Secondary | ICD-10-CM

## 2021-08-12 NOTE — Telephone Encounter (Signed)
Patient's wife [facility]) called requesting an apnt for patient to be seen with labs before to check hemoglobin A1C.   Called Margaret back. Lab and follow up scheduled. To Dr. Hyacinth Meeker to order labs.

## 2021-08-27 DIAGNOSIS — E1149 Type 2 diabetes mellitus with other diabetic neurological complication: Secondary | ICD-10-CM

## 2021-08-28 LAB — BASIC METABOLIC PANEL WITH GFR
BUN: 19 mg/dL (ref 7–25)
CO2: 26 mmol/L (ref 20–32)
Calcium: 9.6 mg/dL (ref 8.6–10.3)
Chloride: 108 mmol/L (ref 98–110)
Creat: 0.79 mg/dL (ref 0.70–1.22)
Glucose, Bld: 99 mg/dL (ref 65–99)
Potassium: 4.2 mmol/L (ref 3.5–5.3)
Sodium: 144 mmol/L (ref 135–146)
eGFR: 90 mL/min/{1.73_m2} (ref >=60)

## 2021-08-28 LAB — HEMOGLOBIN A1C
Hgb A1c MFr Bld: 6.3 % of total Hgb — ABNORMAL HIGH (ref <5.7)
Mean Plasma Glucose: 134 mg/dL
eAG (mmol/L): 7.4 mmol/L

## 2021-09-02 ENCOUNTER — Encounter: Payer: Self-pay | Admitting: Family Medicine

## 2021-09-02 ENCOUNTER — Non-Acute Institutional Stay: Payer: Medicare Other | Admitting: Family Medicine

## 2021-09-02 VITALS — BP 130/68 | HR 80 | Temp 97.5°F | Ht 71.0 in | Wt 206.8 lb

## 2021-09-02 DIAGNOSIS — Z86711 Personal history of pulmonary embolism: Secondary | ICD-10-CM | POA: Diagnosis not present

## 2021-09-02 DIAGNOSIS — R911 Solitary pulmonary nodule: Secondary | ICD-10-CM | POA: Diagnosis not present

## 2021-09-02 DIAGNOSIS — H8113 Benign paroxysmal vertigo, bilateral: Secondary | ICD-10-CM | POA: Diagnosis not present

## 2021-09-02 DIAGNOSIS — E782 Mixed hyperlipidemia: Secondary | ICD-10-CM

## 2021-09-02 DIAGNOSIS — Z96652 Presence of left artificial knee joint: Secondary | ICD-10-CM

## 2021-09-02 DIAGNOSIS — E1140 Type 2 diabetes mellitus with diabetic neuropathy, unspecified: Secondary | ICD-10-CM

## 2021-09-04 NOTE — Progress Notes (Signed)
Provider:  Jacalyn Lefevre, MD  Careteam: Patient Care Team: Frederica Kuster, MD as PCP - General (Family Medicine)  PLACE OF SERVICE:  [facility]  Advanced Directive information    Allergies  Allergen Reactions   Sulfa Antibiotics Swelling    Joint swelling    Chief Complaint  Patient presents with   Medical Management of Chronic Issues    Patient presents today for a 3 month follow-up.     HPI: Patient is a 80 y.o. male patient is here to follow-up chronic problems including diabetes, hyperlipidemia, benign positional vertigo,.  He had some recent labs which we reviewed basically A1c is unchanged at 6.3.  His medication includes glargine insulin 35 units every morning.  He tries to pay strict attention to his diet but admits that it is difficult with the food that he has offered at friend's home.  Renal function remains very good with GFR at 90 and creatinine at 0.79.  Lipids were assessed 3 months ago and LDL cholesterol was 84. Since his last visit here he has been seen by pulmonary for follow-up of a lung nodule.  In process of trying to obtain all reports but if that is not possible is plan for follow-up CT in 2024. Also had a left knee replacement and is currently in physical therapy and that is coming along nicely per his assessment.  He has gained some weight after the knee surgery due to relative lack of movement.  Review of Systems:  Review of Systems  Constitutional: Negative.   HENT: Negative.    Eyes: Negative.   Respiratory: Negative.    Cardiovascular: Negative.   Musculoskeletal: Negative.   Neurological: Negative.   Psychiatric/Behavioral: Negative.    All other systems reviewed and are negative.   Past Medical History:  Diagnosis Date   Arthritis    Complication of anesthesia    Difficult to wake up   Diabetes mellitus without complication (HCC)    Headache    History of kidney stones    Pulmonary embolism (HCC)    After shoulder surgery    Vertigo    Past Surgical History:  Procedure Laterality Date   ADENOIDECTOMY Bilateral    FINGER SURGERY     Left middle   FOOT SURGERY     forehead surgery     L4-L5 fusion     LITHOTRIPSY     Prostrate     [place]   SHOULDER SURGERY     Complete replacement to right shoulder   TONSILLECTOMY     TRANSURETHRAL RESECTION OF PROSTATE N/A 09/02/2020   Procedure: TRANSURETHRAL RESECTION OF THE PROSTATE (TURP);  Surgeon: Bjorn Pippin, MD;  Location: [facility];  Service: Urology;  Laterality: N/A;   Social History:   reports that he has never smoked. He has never used smokeless tobacco. He reports that he does not currently use alcohol. He reports that he does not use drugs.  No family history on file.  Medications: Patient's Medications  New Prescriptions   No medications on file  Previous Medications   ACETAMINOPHEN (TYLENOL) 500 MG TABLET    Take 1,000 mg by mouth 4 (four) times daily as needed.   ATORVASTATIN (LIPITOR) 40 MG TABLET    TAKE 1 TABLET BY MOUTH EVERY DAY   B-D ULTRAFINE III SHORT PEN 31G X 8 MM MISC    SMARTSIG:1 Each SUB-Q Daily   CARBIDOPA-LEVODOPA (SINEMET CR) 50-200 MG TABLET    TAKE 1 TABLET BY MOUTH TWICE  A DAY   ENOXAPARIN (LOVENOX) 40 MG/0.4ML INJECTION    Inject 40 mg into the skin daily.   FLUTICASONE (FLONASE) 50 MCG/ACT NASAL SPRAY    Place 1 spray into both nostrils daily as needed for allergies or rhinitis.   GABAPENTIN (NEURONTIN) 300 MG CAPSULE    Take 300 mg by mouth daily.   GABAPENTIN (NEURONTIN) 300 MG CAPSULE    Take 600 mg by mouth 2 (two) times daily.   INSULIN GLARGINE (BASAGLAR KWIKPEN) 100 UNIT/ML    Inject 35 Units into the skin in the morning.   MULTIPLE MINERALS PO    Take 1 tablet by mouth daily.   POLYETHYLENE GLYCOL (MIRALAX / GLYCOLAX) 17 G PACKET    Take 17 g by mouth at bedtime.   SENNOSIDES-DOCUSATE SODIUM (SENOKOT-S) 8.6-50 MG TABLET    Take 1 tablet by mouth 2 (two) times daily.  Modified Medications   No medications on file   Discontinued Medications   No medications on file    Physical Exam:  Vitals:   09/02/21 1527  BP: 130/68  Pulse: 80  Temp: (!) 97.5 F (36.4 C)  SpO2: 96%  Weight: 206 lb 12.8 oz (93.8 kg)  Height: 5\' 11"  (1.803 m)   Body mass index is 28.84 kg/m. Wt Readings from Last 3 Encounters:  09/02/21 206 lb 12.8 oz (93.8 kg)  07/23/21 201 lb 6.4 oz (91.4 kg)  06/11/21 195 lb (88.5 kg)    Physical Exam Vitals and nursing note reviewed.  Constitutional:      Appearance: Normal appearance.  Cardiovascular:     Rate and Rhythm: Normal rate and regular rhythm.  Pulmonary:     Effort: Pulmonary effort is normal.     Breath sounds: Normal breath sounds.  Musculoskeletal:     Comments: Using a cane for ambulation  now  Neurological:     General: No focal deficit present.     Mental Status: He is alert and oriented to person, place, and time.  Psychiatric:        Mood and Affect: Mood normal.     Labs reviewed: Basic Metabolic Panel: Recent Labs    04/28/21 0705 05/09/21 0000 05/29/21 0000 08/27/21 0730  NA 141 142 136* 144  K 4.7 4.4 4.4 4.2  CL 105 104 102 108  CO2 28 22 25* 26  GLUCOSE 121*  --   --  99  BUN 27* 25* 21 19  CREATININE 0.91 0.8 0.7 0.79  CALCIUM 9.9 10.0 8.6* 9.6  TSH  --   --  2.27  --    Liver Function Tests: Recent Labs    04/28/21 0705 05/09/21 0000  AST 17  --   ALT 21  --   BILITOT 0.8  --   PROT 7.0  --   ALBUMIN  --  4.3   No results for input(s): "LIPASE", "AMYLASE" in the last 8760 hours. No results for input(s): "AMMONIA" in the last 8760 hours. CBC: Recent Labs    05/29/21 0000  WBC 6.4  NEUTROABS 4,653.00  HGB 12.2*  HCT 37*  PLT 308   Lipid Panel: Recent Labs    04/28/21 0705  CHOL 154  HDL 56  LDLCALC 84  TRIG 56  CHOLHDL 2.8   TSH: Recent Labs    05/29/21 0000  TSH 2.27   A1C: Lab Results  Component Value Date   HGBA1C 6.3 (H) 08/27/2021     Assessment/Plan  1. BPPV (benign paroxysmal  positional vertigo),  bilateral Discussed Epley maneuver m  2. History of pulmonary embolism Not currently on anticoagulant.  This is an remote history.  I think if he had pulmonary embolism today he might be on chronic anticoagulant  3. Mixed hyperlipidemia Lipids are at goal on atorvastatin 40 mg  4. Lung nodule seen on imaging study Apparently stable but that is being followed by pulmonary  5. Status post left knee replacement Improving.  PT is ongoing  6. Type 2 diabetes mellitus with diabetic neuropathy, without long-term current use of insulin (HCC) Diabetes is stable.  Continue only on 1 dose of basal insulin daily   Jacalyn Lefevre, MD [facility] [phone]

## 2021-09-21 ENCOUNTER — Other Ambulatory Visit: Payer: Self-pay | Admitting: Nurse Practitioner

## 2021-09-21 DIAGNOSIS — E1140 Type 2 diabetes mellitus with diabetic neuropathy, unspecified: Secondary | ICD-10-CM

## 2021-09-23 ENCOUNTER — Telehealth: Payer: Self-pay

## 2021-09-23 DIAGNOSIS — E782 Mixed hyperlipidemia: Secondary | ICD-10-CM

## 2021-09-23 MED ORDER — ATORVASTATIN CALCIUM 40 MG PO TABS: 40.0000 mg | ORAL_TABLET | Freq: Every day | ORAL | 1 refills | Status: DC

## 2021-09-23 NOTE — Telephone Encounter (Signed)
Patient requesting refill on medication.

## 2021-09-29 ENCOUNTER — Other Ambulatory Visit: Payer: Medicare Other

## 2021-10-09 ENCOUNTER — Other Ambulatory Visit: Payer: Self-pay | Admitting: Family Medicine

## 2021-10-09 DIAGNOSIS — Z981 Arthrodesis status: Secondary | ICD-10-CM

## 2021-10-21 ENCOUNTER — Other Ambulatory Visit: Payer: Self-pay | Admitting: Internal Medicine

## 2021-10-21 DIAGNOSIS — E782 Mixed hyperlipidemia: Secondary | ICD-10-CM

## 2021-10-21 DIAGNOSIS — E1140 Type 2 diabetes mellitus with diabetic neuropathy, unspecified: Secondary | ICD-10-CM

## 2021-10-29 ENCOUNTER — Non-Acute Institutional Stay: Payer: Medicare Other | Admitting: Nurse Practitioner

## 2021-10-29 ENCOUNTER — Encounter: Payer: Self-pay | Admitting: Nurse Practitioner

## 2021-10-29 DIAGNOSIS — N400 Enlarged prostate without lower urinary tract symptoms: Secondary | ICD-10-CM

## 2021-10-29 DIAGNOSIS — K5901 Slow transit constipation: Secondary | ICD-10-CM

## 2021-10-29 DIAGNOSIS — Z86711 Personal history of pulmonary embolism: Secondary | ICD-10-CM

## 2021-10-29 DIAGNOSIS — M159 Polyosteoarthritis, unspecified: Secondary | ICD-10-CM

## 2021-10-29 DIAGNOSIS — E1140 Type 2 diabetes mellitus with diabetic neuropathy, unspecified: Secondary | ICD-10-CM | POA: Diagnosis not present

## 2021-10-29 DIAGNOSIS — H8113 Benign paroxysmal vertigo, bilateral: Secondary | ICD-10-CM | POA: Diagnosis not present

## 2021-10-29 DIAGNOSIS — U071 COVID-19: Secondary | ICD-10-CM

## 2021-10-29 DIAGNOSIS — Z981 Arthrodesis status: Secondary | ICD-10-CM

## 2021-10-29 DIAGNOSIS — R911 Solitary pulmonary nodule: Secondary | ICD-10-CM

## 2021-10-29 DIAGNOSIS — E782 Mixed hyperlipidemia: Secondary | ICD-10-CM

## 2021-10-29 MED ORDER — PAXLOVID (300/100) 20 X 150 MG & 10 X 100MG PO TBPK: 1.0000 | ORAL_TABLET | Freq: Two times a day (BID) | ORAL | 0 refills | Status: AC

## 2021-10-29 NOTE — Assessment & Plan Note (Signed)
Hx of lumbar spine fusion, with sciatica, spastic pain in legs occurs mostly in 3 am, takes Gabapentin, Sinemet

## 2021-10-29 NOTE — Assessment & Plan Note (Signed)
s/p left knee replacement

## 2021-10-29 NOTE — Assessment & Plan Note (Signed)
BPH, had urology evaluation, monitor PSA, off Finasteride, Tamsulosin. 09/02/20 Dr. Jeffie Pollock TURP

## 2021-10-29 NOTE — Assessment & Plan Note (Signed)
remote, not on anticoagulant

## 2021-10-29 NOTE — Assessment & Plan Note (Signed)
discussed Epley maneuver

## 2021-10-29 NOTE — Assessment & Plan Note (Signed)
takes Atorvastatin, LDL 84 05/09/21

## 2021-10-29 NOTE — Assessment & Plan Note (Signed)
daily basal insulin, Hgb a1c 6.3, Bun/creat 19/0.79 08/27/21, TSH 2.27 05/29/21, takes Gabapentin

## 2021-10-29 NOTE — Assessment & Plan Note (Signed)
takes MiraLax, Senokot S 

## 2021-10-29 NOTE — Progress Notes (Signed)
[facility]   Place of Service:  Clinic (12) Provider:  Marwin Primmer X, NP  Patient Care Team: Frederica KusterMiller, Stephen M, MD as PCP - General (Family Medicine)  Extended Emergency Contact Information Primary Emergency Contact: [facility] Address: [address]          [place], [place] [id number] [place] Home Phone: [phone] Mobile Phone: [phone] Relation: Spouse Preferred language: English Interpreter needed? No  Code Status:  FULL Goals of care: Advanced Directive information    06/05/2021    1:26 PM  Advanced Directives  Does Patient Have a Medical Advance Directive? No  Does patient want to make changes to medical advance directive? No - Patient declined     Chief Complaint  Patient presents with   Acute Visit    Patient is covid positive    HPI:  Pt is a 81 y.o. male seen today for an acute visit for tested positive COVID, feeling malaise, denied headache, sore throat, muscle aches, fever, chills, post nasal drip, cough, congestion    BPPV discussed Epley maneuver  Hx of PE, remote, not on anticoagulant  HLD takes Atorvastatin, LDL 84 05/09/21  Lung nodule seen on imaging study follows pulmonology  OA s/p left knee replacement, s/o R shoulder replacement.   T2DM, daily basal insulin, Hgb a1c 6.3, Bun/creat 19/0.79 08/27/21, TSH 2.27 05/29/21  Hx of lumbar spine fusion, with sciatica, spastic pain in legs occurs mostly in 3 am, takes Gabapentin, Sinemet  Peripheral neuropathy, takes Gabapentin  Constipation, takes MiraLax, Senokot S   BPH, had urology evaluation, monitor PSA, off Finasteride, Tamsulosin. 09/02/20 Dr. Annabell HowellsWrenn TURP   Past Medical History:  Diagnosis Date   Arthritis    Complication of anesthesia    Difficult to wake up   Diabetes mellitus without complication (HCC)    Headache    History of kidney stones    Pulmonary embolism (HCC)    After shoulder surgery   Vertigo    Past Surgical History:   Procedure Laterality Date   ADENOIDECTOMY Bilateral    FINGER SURGERY     Left middle   FOOT SURGERY     forehead surgery     L4-L5 fusion     LITHOTRIPSY     Prostrate     Frozen   SHOULDER SURGERY     Complete replacement to right shoulder   TONSILLECTOMY     TRANSURETHRAL RESECTION OF PROSTATE N/A 09/02/2020   Procedure: TRANSURETHRAL RESECTION OF THE PROSTATE (TURP);  Surgeon: Bjorn PippinWrenn, John, MD;  Location: [facility];  Service: Urology;  Laterality: N/A;    Allergies  Allergen Reactions   Sulfa Antibiotics Swelling    Joint swelling    Outpatient Encounter Medications as of 10/29/2021  Medication Sig   acetaminophen (TYLENOL) 500 MG tablet Take 1,000 mg by mouth 4 (four) times daily as needed.   amoxicillin-clavulanate (AUGMENTIN) 875-125 MG tablet Take 1 tablet one hour prior to any dental procedures   atorvastatin (LIPITOR) 40 MG tablet Take 1 tablet (40 mg total) by mouth daily.   B-D ULTRAFINE III SHORT PEN 31G X 8 MM MISC SMARTSIG:1 Each SUB-Q Daily   carbidopa-levodopa (SINEMET CR) 50-200 MG tablet TAKE 1 TABLET BY MOUTH TWICE A DAY   fluticasone (FLONASE) 50 MCG/ACT nasal spray Place 1 spray into both nostrils daily as needed for allergies or rhinitis.   gabapentin (NEURONTIN) 300 MG capsule Take 300 mg by mouth daily.   gabapentin (NEURONTIN) 300 MG capsule Take  600 mg by mouth 2 (two) times daily.   Insulin Glargine (BASAGLAR KWIKPEN) 100 UNIT/ML Inject 35 Units into the skin in the morning. E11.40   MULTIPLE MINERALS PO Take 1 tablet by mouth daily.   nirmatrelvir & ritonavir (PAXLOVID, 300/100,) 20 x 150 MG & 10 x 100MG  TBPK Take 1 tablet by mouth 2 (two) times daily for 5 days.   polyethylene glycol (MIRALAX / GLYCOLAX) 17 g packet Take 17 g by mouth at bedtime.   sennosides-docusate sodium (SENOKOT-S) 8.6-50 MG tablet Take 1 tablet by mouth 2 (two) times daily.   [DISCONTINUED] Nirmatrelvir-Ritonavir (PAXLOVID, 300/100, PO) Take 1 tablet by mouth 2 (two) times daily.    enoxaparin (LOVENOX) 40 MG/0.4ML injection Inject 40 mg into the skin daily.   No facility-administered encounter medications on file as of 10/29/2021.    Review of Systems  Constitutional:  Negative for activity change, fatigue and fever.  HENT:  Positive for hearing loss. Negative for congestion, rhinorrhea, sore throat and trouble swallowing.   Eyes:  Negative for visual disturbance.  Respiratory:  Negative for cough, shortness of breath and wheezing.   Cardiovascular:  Positive for leg swelling. Negative for chest pain and palpitations.  Gastrointestinal:  Negative for abdominal pain and constipation.  Genitourinary:  Positive for frequency. Negative for difficulty urinating and urgency.       3x/night  Musculoskeletal:  Positive for arthralgias and gait problem.  Skin:  Negative for color change.  Neurological:  Positive for numbness. Negative for tremors, speech difficulty and headaches.  Psychiatric/Behavioral:  Positive for sleep disturbance. Negative for confusion. The patient is not nervous/anxious.     Immunization History  Administered Date(s) Administered   Fluad Quad(high Dose 65+) 11/24/2020   Influenza, High Dose Seasonal PF 11/24/2020   Influenza-Unspecified 01/08/2002, 12/16/2003, 11/07/2007   PFIZER Comirnaty(Gray Top)Covid-19 Tri-Sucrose Vaccine 03/16/2019, 04/06/2019, 11/05/2019   Pfizer Covid-19 Vaccine Bivalent Booster 23yrs & up 06/27/2020, 10/28/2020   Td 06/09/2001   Td (Adult),unspecified 06/09/2001   Pertinent  Health Maintenance Due  Topic Date Due   INFLUENZA VACCINE  09/08/2021   OPHTHALMOLOGY EXAM  12/09/2021   FOOT EXAM  12/24/2021   HEMOGLOBIN A1C  02/27/2022      09/03/2020    8:40 AM 09/25/2020    1:25 PM 10/09/2020    2:58 PM 01/22/2021    6:33 PM 04/22/2021    3:04 PM  Fall Risk  Falls in the past year?  0 0  0  Was there an injury with Fall?  0 0  0  Fall Risk Category Calculator  0 0  0  Fall Risk Category  Low Low  Low  Patient  Fall Risk Level Moderate fall risk Low fall risk Low fall risk Low fall risk Low fall risk  Patient at Risk for Falls Due to  No Fall Risks No Fall Risks  No Fall Risks  Fall risk Follow up  Falls evaluation completed Falls evaluation completed  Falls evaluation completed   Functional Status Survey:    Vitals:   10/29/21 1528  BP: (!) 110/58  Pulse: 81  Resp: 20  Temp: 98.8 F (37.1 C)  TempSrc: Temporal  SpO2: 95%  Weight: 209 lb (94.8 kg)  Height: 5\' 11"  (1.803 m)   Body mass index is 29.15 kg/m. Physical Exam Vitals and nursing note reviewed.  Constitutional:      Appearance: Normal appearance.  HENT:     Head: Normocephalic and atraumatic.     Nose: Nose normal.  Mouth/Throat:     Mouth: Mucous membranes are moist.     Pharynx: Posterior oropharyngeal erythema present.     Comments: Mild redness right side of throat Eyes:     Extraocular Movements: Extraocular movements intact.     Conjunctiva/sclera: Conjunctivae normal.     Pupils: Pupils are equal, round, and reactive to light.  Cardiovascular:     Rate and Rhythm: Normal rate and regular rhythm.     Heart sounds: No murmur heard.    Comments: Hx of PAC Pulmonary:     Effort: Pulmonary effort is normal.     Breath sounds: No rales.  Abdominal:     General: Bowel sounds are normal.     Palpations: Abdomen is soft.     Tenderness: There is no abdominal tenderness.  Musculoskeletal:        General: Swelling and tenderness present.     Cervical back: Normal range of motion and neck supple.     Right lower leg: No edema.     Left lower leg: Edema present.     Comments: Right shoulder discomfort with ROM, not strong as the left. Left knee mild swelling/warmth, still pain with movement/weight bearing/walking, s/p arthroplasty 05/19/21  Skin:    General: Skin is warm and dry.  Neurological:     General: No focal deficit present.     Mental Status: He is alert and oriented to person, place, and time. Mental  status is at baseline.  Psychiatric:        Mood and Affect: Mood normal.        Behavior: Behavior normal.        Thought Content: Thought content normal.        Judgment: Judgment normal.     Labs reviewed: Recent Labs    04/28/21 0705 05/09/21 0000 05/29/21 0000 08/27/21 0730  NA 141 142 136* 144  K 4.7 4.4 4.4 4.2  CL 105 104 102 108  CO2 28 22 25* 26  GLUCOSE 121*  --   --  99  BUN 27* 25* 21 19  CREATININE 0.91 0.8 0.7 0.79  CALCIUM 9.9 10.0 8.6* 9.6   Recent Labs    04/28/21 0705 05/09/21 0000  AST 17  --   ALT 21  --   BILITOT 0.8  --   PROT 7.0  --   ALBUMIN  --  4.3   Recent Labs    05/29/21 0000  WBC 6.4  NEUTROABS 4,653.00  HGB 12.2*  HCT 37*  PLT 308   Lab Results  Component Value Date   TSH 2.27 05/29/2021   Lab Results  Component Value Date   HGBA1C 6.3 (H) 08/27/2021   Lab Results  Component Value Date   CHOL 154 04/28/2021   HDL 56 04/28/2021   LDLCALC 84 04/28/2021   TRIG 56 04/28/2021   CHOLHDL 2.8 04/28/2021    Significant Diagnostic Results in last 30 days:  No results found.  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 09/02/20 Dr. Annabell Howells TURP  COVID-19 virus infection   tested positive COVID, feeling malaise, denied headache, sore throat, muscle aches, fever, chills, post nasal drip, cough, congestion. Will treat with Paxlovid 300/100mg  bid x 5 days.      Family/ staff Communication: plan of care reviewed with the patient and charge nurse.   Labs/tests ordered:  none  Next appointment prn

## 2021-10-29 NOTE — Assessment & Plan Note (Signed)
Follows pulmonology. 

## 2021-10-30 ENCOUNTER — Encounter: Payer: Self-pay | Admitting: Nurse Practitioner

## 2021-10-30 NOTE — Assessment & Plan Note (Signed)
tested positive COVID, feeling malaise, denied headache, sore throat, muscle aches, fever, chills, post nasal drip, cough, congestion. Will treat with Paxlovid 300/100mg  bid x 5 days.

## 2021-11-25 ENCOUNTER — Other Ambulatory Visit: Payer: Self-pay | Admitting: Nurse Practitioner

## 2021-11-25 DIAGNOSIS — E1140 Type 2 diabetes mellitus with diabetic neuropathy, unspecified: Secondary | ICD-10-CM

## 2021-12-13 ENCOUNTER — Other Ambulatory Visit: Payer: Self-pay

## 2021-12-13 ENCOUNTER — Encounter (HOSPITAL_COMMUNITY): Payer: Self-pay | Admitting: *Deleted

## 2021-12-13 ENCOUNTER — Emergency Department (HOSPITAL_COMMUNITY)
Admission: EM | Admit: 2021-12-13 | Discharge: 2021-12-13 | Disposition: A | Discharge status: Home / Self Care | Payer: Medicare Other | Attending: Emergency Medicine | Admitting: Emergency Medicine

## 2021-12-13 ENCOUNTER — Emergency Department (HOSPITAL_COMMUNITY): Payer: Medicare Other

## 2021-12-13 DIAGNOSIS — E119 Type 2 diabetes mellitus without complications: Secondary | ICD-10-CM | POA: Insufficient documentation

## 2021-12-13 DIAGNOSIS — Z794 Long term (current) use of insulin: Secondary | ICD-10-CM | POA: Diagnosis not present

## 2021-12-13 DIAGNOSIS — R42 Dizziness and giddiness: Secondary | ICD-10-CM | POA: Diagnosis present

## 2021-12-13 DIAGNOSIS — I10 Essential (primary) hypertension: Secondary | ICD-10-CM | POA: Diagnosis not present

## 2021-12-13 DIAGNOSIS — H8113 Benign paroxysmal vertigo, bilateral: Secondary | ICD-10-CM | POA: Insufficient documentation

## 2021-12-13 DIAGNOSIS — Z79899 Other long term (current) drug therapy: Secondary | ICD-10-CM | POA: Diagnosis not present

## 2021-12-13 LAB — I-STAT CHEM 8, ED
BUN: 27 mg/dL — ABNORMAL HIGH (ref 8–23)
Calcium, Ion: 1.25 mmol/L (ref 1.15–1.40)
Chloride: 103 mmol/L (ref 98–111)
Creatinine, Ser: 0.7 mg/dL (ref 0.61–1.24)
Glucose, Bld: 145 mg/dL — ABNORMAL HIGH (ref 70–99)
HCT: 42 % (ref 39.0–52.0)
Hemoglobin: 14.3 g/dL (ref 13.0–17.0)
Potassium: 3.8 mmol/L (ref 3.5–5.1)
Sodium: 140 mmol/L (ref 135–145)
TCO2: 25 mmol/L (ref 22–32)

## 2021-12-13 LAB — CBC
HCT: 42.2 % (ref 39.0–52.0)
Hemoglobin: 14.3 g/dL (ref 13.0–17.0)
MCH: 31.3 pg (ref 26.0–34.0)
MCHC: 33.9 g/dL (ref 30.0–36.0)
MCV: 92.3 fL (ref 80.0–100.0)
Platelets: 202 10*3/uL (ref 150–400)
RBC: 4.57 MIL/uL (ref 4.22–5.81)
RDW: 13.1 % (ref 11.5–15.5)
WBC: 3.5 10*3/uL — ABNORMAL LOW (ref 4.0–10.5)
nRBC: 0 % (ref 0.0–0.2)

## 2021-12-13 LAB — COMPREHENSIVE METABOLIC PANEL
ALT: 5 U/L (ref 0–44)
AST: 24 U/L (ref 15–41)
Albumin: 3.8 g/dL (ref 3.5–5.0)
Alkaline Phosphatase: 92 U/L (ref 38–126)
Anion gap: 13 (ref 5–15)
BUN: 25 mg/dL — ABNORMAL HIGH (ref 8–23)
CO2: 23 mmol/L (ref 22–32)
Calcium: 9.7 mg/dL (ref 8.9–10.3)
Chloride: 103 mmol/L (ref 98–111)
Creatinine, Ser: 0.78 mg/dL (ref 0.61–1.24)
GFR, Estimated: >60 mL/min (ref >60)
Glucose, Bld: 156 mg/dL — ABNORMAL HIGH (ref 70–99)
Potassium: 3.8 mmol/L (ref 3.5–5.1)
Sodium: 139 mmol/L (ref 135–145)
Total Bilirubin: 0.9 mg/dL (ref 0.3–1.2)
Total Protein: 6.4 g/dL — ABNORMAL LOW (ref 6.5–8.1)

## 2021-12-13 LAB — URINALYSIS, ROUTINE W REFLEX MICROSCOPIC
Bilirubin Urine: NEGATIVE
Glucose, UA: NEGATIVE mg/dL
Hgb urine dipstick: NEGATIVE
Ketones, ur: 5 mg/dL — AB
Leukocytes,Ua: NEGATIVE
Nitrite: NEGATIVE
Protein, ur: NEGATIVE mg/dL
Specific Gravity, Urine: 1.018 (ref 1.005–1.030)
pH: 5 (ref 5.0–8.0)

## 2021-12-13 LAB — APTT: aPTT: 34 seconds (ref 24–36)

## 2021-12-13 LAB — RAPID URINE DRUG SCREEN, HOSP PERFORMED
Amphetamines: NOT DETECTED
Barbiturates: NOT DETECTED
Benzodiazepines: NOT DETECTED
Cocaine: NOT DETECTED
Opiates: NOT DETECTED
Tetrahydrocannabinol: NOT DETECTED

## 2021-12-13 LAB — PROTIME-INR
INR: 1 (ref 0.8–1.2)
Prothrombin Time: 13.5 seconds (ref 11.4–15.2)

## 2021-12-13 LAB — DIFFERENTIAL
Abs Immature Granulocytes: 0 10*3/uL (ref 0.00–0.07)
Basophils Absolute: 0 10*3/uL (ref 0.0–0.1)
Basophils Relative: 1 %
Eosinophils Absolute: 0.1 10*3/uL (ref 0.0–0.5)
Eosinophils Relative: 4 %
Immature Granulocytes: 0 %
Lymphocytes Relative: 28 %
Lymphs Abs: 1 10*3/uL (ref 0.7–4.0)
Monocytes Absolute: 0.7 10*3/uL (ref 0.1–1.0)
Monocytes Relative: 21 %
Neutro Abs: 1.7 10*3/uL (ref 1.7–7.7)
Neutrophils Relative %: 46 %

## 2021-12-13 LAB — ETHANOL: Alcohol, Ethyl (B): <10 mg/dL (ref <10)

## 2021-12-13 LAB — CBG MONITORING, ED: Glucose-Capillary: 114 mg/dL — ABNORMAL HIGH (ref 70–99)

## 2021-12-13 MED ORDER — MECLIZINE HCL 25 MG PO TABS
25.0000 mg | ORAL_TABLET | Freq: Once | ORAL | Status: AC
  Administered 2021-12-13: 25 mg via ORAL
  Filled 2021-12-13: qty 1

## 2021-12-13 MED ORDER — MECLIZINE HCL 25 MG PO TABS: 25.0000 mg | ORAL_TABLET | Freq: Three times a day (TID) | ORAL | 0 refills | Status: DC | PRN

## 2021-12-13 NOTE — ED Notes (Signed)
Called name 3x, no answer

## 2021-12-13 NOTE — ED Provider Triage Note (Signed)
Emergency Medicine Provider Triage Evaluation Note  Stephen Burns , a 81 y.o. male  was evaluated in triage.  Pt complains of feeling woozy today onset at 2 PM but mild.  Symptoms worse with standing.  Around 11:00, went to bed and when he laid down in bed experienced severe room spinning which was worse with rolling over in bed/changes in position.  History of vertigo, last episode 2 years ago, does not take medication for this.  Denies unilateral weakness, numbness, changes in speech.  Reports visual disturbance as room spinning, mild at this time while sitting in a wheelchair. . Brought in by EMS who reports from home, hx vertigo, doesn't take meds, hasn't had episode for 2 years. COVID booster earlier, started to feel dizzy/spinning. Laid down without improvement. Symptoms worse with standing.  Review of Systems  Positive: As above Negative: As above  Physical Exam  BP 135/71 (BP Location: Right Arm)   Pulse 71   Temp (!) 97.5 F (36.4 C) (Oral)   Resp 18   SpO2 96%  Gen:   Awake, no distress   Resp:  Normal effort  MSK:   Moves extremities without difficulty  Other:  Equal arm and leg strength, no drift, no facial asymmetry.  Medical Decision Making  Medically screening exam initiated at 1:15 AM.  Appropriate orders placed.  Stephen Burns was informed that the remainder of the evaluation will be completed by another provider, this initial triage assessment does not replace that evaluation, and the importance of remaining in the ED until their evaluation is complete.     Stephen Learn, PA-C 12/13/21 [phone]

## 2021-12-13 NOTE — ED Triage Notes (Signed)
PT arrived via EMS with dizziness since 2p today. History of vertigo and last episode was 2 years ago. Pt had COVID booster on Thursday. CBg 166. Pt states has a spinning feeling. He notices when he gets up it spins. While sitting he feels like a band around his head.  No weakness or numbness in extremities.

## 2021-12-13 NOTE — Discharge Instructions (Addendum)
Please try Epley maneuver for symptom relief.  Take meclizine as needed for dizziness.  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  I recommend close follow-up with PCP for reevaluation.  Please do not hesitate to return to emergency department if worrisome signs symptoms we discussed become apparent.

## 2021-12-13 NOTE — ED Notes (Signed)
Patient w/ no issues ambulating unassisted outside of treatment room. Denies dizziness at this time.

## 2021-12-13 NOTE — ED Provider Notes (Signed)
[facility] EMERGENCY DEPARTMENT Provider Note   CSN: [id number] Arrival date & time: 12/13/21  0101     History  Chief Complaint  Patient presents with   Dizziness    Stephen Burns is a 81 y.o. male with a past medical history of BPPV, type 2 diabetes, hypertension presenting to the emergency department for evaluation of vertigo.  Patient was diagnosed with BPPV 2 years ago.  He has been doing fine until yesterday around 2 PM when he developed vertigo.  States he feels the room was spinning.  Got worse when he turned his head, walking, laying down, changing position.  Feels like he is going to lose balance.  Denies having any fall.  Denies vision changes or ringing in ears.  Denies any chest pain, shortness of breath, nausea, vomiting, bowel changes, urinary symptoms, fever.   Dizziness      Home Medications Prior to Admission medications   Medication Sig Start Date End Date Taking? Authorizing Provider  acetaminophen (TYLENOL) 500 MG tablet Take 1,000 mg by mouth 4 (four) times daily as needed.    [provider]  amoxicillin-clavulanate (AUGMENTIN) 875-125 MG tablet Take 1 tablet one hour prior to any dental procedures 07/24/21   [provider]  atorvastatin (LIPITOR) 40 MG tablet Take 1 tablet (40 mg total) by mouth daily. 09/23/21   Wardell Honour, MD  carbidopa-levodopa (SINEMET CR) 50-200 MG tablet TAKE 1 TABLET BY MOUTH TWICE A DAY 10/09/21   Wardell Honour, MD  enoxaparin (LOVENOX) 40 MG/0.4ML injection Inject 40 mg into the skin daily. 05/22/21 06/21/21  [provider]  fluticasone (FLONASE) 50 MCG/ACT nasal spray Place 1 spray into both nostrils daily as needed for allergies or rhinitis.    [provider]  gabapentin (NEURONTIN) 300 MG capsule Take 300 mg by mouth daily.    [provider]  gabapentin (NEURONTIN) 300 MG capsule Take 600 mg by mouth 2 (two) times daily.    [provider]  Insulin  Glargine (BASAGLAR KWIKPEN) 100 UNIT/ML Inject 35 Units into the skin in the morning. E11.40 09/21/21   Wardell Honour, MD  Insulin Pen Needle (B-D ULTRAFINE III SHORT PEN) 31G X 8 MM MISC USE AS DIRECTED 11/25/21   Wardell Honour, MD  MULTIPLE MINERALS PO Take 1 tablet by mouth daily.    [provider]  polyethylene glycol (MIRALAX / GLYCOLAX) 17 g packet Take 17 g by mouth at bedtime.    [provider]  sennosides-docusate sodium (SENOKOT-S) 8.6-50 MG tablet Take 1 tablet by mouth 2 (two) times daily.    [provider]      Allergies    Sulfa antibiotics    Review of Systems   Review of Systems  Neurological:  Positive for dizziness.    Physical Exam Updated Vital Signs BP 120/79 (BP Location: Left Arm)   Pulse 74   Temp 97.7 F (36.5 C)   Resp 16   SpO2 99%  Physical Exam Vitals and nursing note reviewed.  Constitutional:      Appearance: Normal appearance.  HENT:     Head: Normocephalic and atraumatic.     Mouth/Throat:     Mouth: Mucous membranes are moist.  Eyes:     General: No scleral icterus. Cardiovascular:     Rate and Rhythm: Normal rate and regular rhythm.     Pulses: Normal pulses.     Heart sounds: Normal heart sounds.  Pulmonary:  Effort: Pulmonary effort is normal.     Breath sounds: Normal breath sounds.  Abdominal:     General: Abdomen is flat.     Palpations: Abdomen is soft.     Tenderness: There is no abdominal tenderness.  Musculoskeletal:        General: No deformity.  Skin:    General: Skin is warm.     Findings: No rash.  Neurological:     General: No focal deficit present.     Mental Status: He is alert.     Comments: Moving bilateral upper extremities.  Normal strength throughout.  PERLA.  Cranial nerves II through XII grossly intact.    Psychiatric:        Mood and Affect: Mood normal.     ED Results / Procedures / Treatments   Labs (all labs ordered are listed, but only abnormal results are  displayed) Labs Reviewed  CBC - Abnormal; Notable for the following components:      Result Value   WBC 3.5 (*)    All other components within normal limits  COMPREHENSIVE METABOLIC PANEL - Abnormal; Notable for the following components:   Glucose, Bld 156 (*)    BUN 25 (*)    Total Protein 6.4 (*)    All other components within normal limits  URINALYSIS, ROUTINE W REFLEX MICROSCOPIC - Abnormal; Notable for the following components:   Ketones, ur 5 (*)    All other components within normal limits  I-STAT CHEM 8, ED - Abnormal; Notable for the following components:   BUN 27 (*)    Glucose, Bld 145 (*)    All other components within normal limits  CBG MONITORING, ED - Abnormal; Notable for the following components:   Glucose-Capillary 114 (*)    All other components within normal limits  ETHANOL  PROTIME-INR  APTT  DIFFERENTIAL  RAPID URINE DRUG SCREEN, HOSP PERFORMED    EKG EKG Interpretation  Date/Time:  Sunday December 13 2021 01:19:26 EST Ventricular Rate:  72 PR Interval:  148 QRS Duration: 86 QT Interval:  388 QTC Calculation: 424 R Axis:   62 Text Interpretation: Sinus rhythm with marked sinus arrhythmia Cannot rule out Anterior infarct , age undetermined When compared with ECG of 25-Aug-2020 13:56, PREVIOUS ECG IS PRESENT No significant change since last tracing Confirmed by Blanchie Dessert [id number]) on 12/13/2021 8:18:45 AM  Radiology CT HEAD WO CONTRAST  Result Date: 12/13/2021 CLINICAL DATA:  Dizziness. EXAM: CT HEAD WITHOUT CONTRAST TECHNIQUE: Contiguous axial images were obtained from the base of the skull through the vertex without intravenous contrast. RADIATION DOSE REDUCTION: This exam was performed according to the departmental dose-optimization program which includes automated exposure control, adjustment of the mA and/or kV according to patient size and/or use of iterative reconstruction technique. COMPARISON:  None Available. FINDINGS: Brain: There is no  evidence for acute hemorrhage, hydrocephalus, mass lesion, or abnormal extra-axial fluid collection. No definite CT evidence for acute infarction. Vascular: No hyperdense vessel or unexpected calcification. Skull: No evidence for fracture. No worrisome lytic or sclerotic lesion. Sinuses/Orbits: No acute finding. Other: None. IMPRESSION: No acute intracranial abnormality. Electronically Signed   By: Misty Stanley M.D.   On: 12/13/2021 08:16    Procedures Procedures    Medications Ordered in ED Medications  meclizine (ANTIVERT) tablet 25 mg (has no administration in time range)    ED Course/ Medical Decision Making/ A&P  Medical Decision Making  This patient presents to the ED for dizziness, this involves an extensive number of treatment options, and is a complaint that carries with a high risk of complications and morbidity.  The differential diagnosis includes BPPV, labyrinthitis, Mnire's, CVA/ICH,.  This is not an exhaustive list.  Comorbidities that complicate the patient evaluation See HPI  Social determinants of health NA  Additional history obtained: Additional history obtained from EMR. External records from outside source obtained and review including prior labs  Cardiac monitoring/EKG: The patient was maintained on a cardiac monitor.  I personally reviewed and interpreted the cardiac monitor which showed an underlying rhythm of: Sinus rhythm.  Lab tests: I ordered and personally interpreted labs.  The pertinent results include WBC normal. Hbg normal. Platelets normal. No electrolyte abnormalities noted. BUN 25, creatinine normal. No transaminitis.  Imaging studies: I ordered imaging studies including CT head without contrast which showed no acute intracranial abnormality. I personally reviewed, interpreted imaging and agree with the radiologist's interpretations.  Problem list/ ED course/ Critical interventions/ Medical management: HPI:  See above Vital signs within normal range and stable throughout visit. Laboratory/imaging studies significant for: See above. On physical examination, patient is afebrile and appears in no acute distress.  Heart sounds normal.  Lungs are clear.  Pupils are round, symmetric, reactive to light.  No vision changes or hearing loss.  Neuro exam was unremarkable.  Patient was moving bilateral upper and lower extremities with normal strength throughout.  Cranial nerves II through XII grossly intact.  No neurodeficit on exam.  CVA/ICH unlikely as neuro exam unremarkable and CT scan negative.  There was room spinning sensation when patient turns head.  Per nursing patient did well on ambulation test.  He denies any dizziness or room spinning sensation upon ambulation. Patient's clinical presentations and laboratory/imaging studies are most concerned for BPPV. I sent a Rx of meclizine for dizziness and nausea. Advised patient to try Epley maneuver for symptom relief.  Follow-up with PT at his residency and PCP for further evaluation and management of BPPV. Meclizine ordered. Reevaluation of the patient after these medications showed that the patient improved.   I have reviewed the patient home medicines and have made adjustments as needed.  Consultations obtained: I requested consultation with the attending Dr. Maryan Rued, and discussed lab and imaging findings as well as pertinent plan.  They recommend outpatient follow up.  Disposition Continued outpatient therapy. Follow-up with PCP  recommended for reevaluation of symptoms. Treatment plan discussed with patient.  Pt acknowledged understanding was agreeable to the plan. Worrisome signs and symptoms were discussed with patient, and patient acknowledged understanding to return to the ED if they noticed these signs and symptoms. Patient was stable upon discharge.   This chart was dictated using voice recognition software.  Despite best efforts to proofread,   errors can occur which can change the documentation meaning.          Final Clinical Impression(s) / ED Diagnoses Final diagnoses:  Benign paroxysmal positional vertigo due to bilateral vestibular disorder    Rx / DC Orders ED Discharge Orders          Ordered    meclizine (ANTIVERT) 25 MG tablet  3 times daily PRN        12/13/21 1030              Rex Kras, [place] 12/13/21 2110    Blanchie Dessert, MD 12/16/21 [phone]

## 2021-12-14 ENCOUNTER — Telehealth: Payer: Self-pay

## 2021-12-14 NOTE — Telephone Encounter (Signed)
Transition Care Management Unsuccessful Follow-up Telephone Call  Date of discharge and from where:  [facility], 12/13/2021  Attempts:  1st Attempt  Reason for unsuccessful TCM follow-up call:  Unable to be reached ;due to release into a skilled nursing facility.

## 2021-12-16 ENCOUNTER — Non-Acute Institutional Stay: Payer: Medicare Other | Admitting: Internal Medicine

## 2021-12-16 ENCOUNTER — Encounter: Payer: Self-pay | Admitting: Internal Medicine

## 2021-12-16 VITALS — BP 122/70 | HR 68 | Temp 97.8°F | Resp 18 | Ht 71.0 in | Wt 208.8 lb

## 2021-12-16 DIAGNOSIS — H8113 Benign paroxysmal vertigo, bilateral: Secondary | ICD-10-CM | POA: Diagnosis not present

## 2021-12-16 DIAGNOSIS — H9313 Tinnitus, bilateral: Secondary | ICD-10-CM

## 2021-12-16 DIAGNOSIS — E1140 Type 2 diabetes mellitus with diabetic neuropathy, unspecified: Secondary | ICD-10-CM | POA: Diagnosis not present

## 2021-12-16 DIAGNOSIS — Z96652 Presence of left artificial knee joint: Secondary | ICD-10-CM

## 2021-12-16 DIAGNOSIS — E782 Mixed hyperlipidemia: Secondary | ICD-10-CM

## 2021-12-16 DIAGNOSIS — Z794 Long term (current) use of insulin: Secondary | ICD-10-CM

## 2021-12-16 DIAGNOSIS — M1990 Unspecified osteoarthritis, unspecified site: Secondary | ICD-10-CM

## 2021-12-16 NOTE — Progress Notes (Signed)
[facility]: [facility] Service:  Clinic (12)  Provider:   Code Status:  Goals of Care:     12/16/2021    1:03 PM  Advanced Directives  Does Patient Have a Medical Advance Directive? Yes  Type of Estate agent of [place];Living will;Out of facility DNR (pink MOST or yellow form)  Does patient want to make changes to medical advance directive? No - Patient declined  Copy of Healthcare Power of Attorney in Chart? No - copy requested     Chief Complaint  Patient presents with   Acute Visit    Patient is here to discuss vertigo   Immunizations    Discussed the need for Tetanus , shingles and flu vaccine   Quality Metric Gaps    Discussed the need for AWV and eye exam    HPI: Patient is a 81 y.o. male seen today for an acute visit for Vertigo and other issues  Patient  Patient has h/o Type 2 diabetes on insulin States his CBGs run anywhere from 80-1 30 He wants to know if he can qualify for prelabor  BPPV Was recently in ED because of vertigo was started on meclizine has not taken it yet he said his symptoms resolved by itself.  He wants to know if he should get therapy to see him. Tinnitus Longstanding history he has never seen an ENT and is now interested now Hearing loss  Swelling in one joint of left hand with redness but no real pain  H/o Left Shoulder replacement, h/o Lumbar Fusion, Arthritis,  Also h/o PE after shoulder replacement, HLD  He underwent TKA on 04/11 H/o Lung Nodules  Mostly benign follows with Dr Delton Coombes Since then uses the stick to walk. Came with his wife.  Recent move to friend home [place] Past Medical History:  Diagnosis Date   Arthritis    Complication of anesthesia    Difficult to wake up   Diabetes mellitus without complication (HCC)    Headache    History of kidney stones    Pulmonary embolism (HCC)    After shoulder surgery   Vertigo     Past Surgical History:  Procedure Laterality Date    ADENOIDECTOMY Bilateral    FINGER SURGERY     Left middle   FOOT SURGERY     forehead surgery     L4-L5 fusion     LITHOTRIPSY     Prostrate     Frozen   SHOULDER SURGERY     Complete replacement to right shoulder   TONSILLECTOMY     TRANSURETHRAL RESECTION OF PROSTATE N/A 09/02/2020   Procedure: TRANSURETHRAL RESECTION OF THE PROSTATE (TURP);  Surgeon: Bjorn Pippin, MD;  Location: [facility];  Service: Urology;  Laterality: N/A;    Allergies  Allergen Reactions   Sulfa Antibiotics Swelling    Joint swelling    Outpatient Encounter Medications as of 12/16/2021  Medication Sig   acetaminophen (TYLENOL) 500 MG tablet Take 1,000 mg by mouth 4 (four) times daily as needed.   atorvastatin (LIPITOR) 40 MG tablet Take 1 tablet (40 mg total) by mouth daily.   fluticasone (FLONASE) 50 MCG/ACT nasal spray Place 1 spray into both nostrils daily as needed for allergies or rhinitis.   gabapentin (NEURONTIN) 300 MG capsule Take 300 mg by mouth daily.   gabapentin (NEURONTIN) 300 MG capsule Take 600 mg by mouth 2 (two) times daily.   Insulin Glargine (BASAGLAR KWIKPEN) 100 UNIT/ML Inject  35 Units into the skin in the morning. E11.40   Insulin Pen Needle (B-D ULTRAFINE III SHORT PEN) 31G X 8 MM MISC USE AS DIRECTED   meclizine (ANTIVERT) 25 MG tablet Take 1 tablet (25 mg total) by mouth 3 (three) times daily as needed for dizziness.   MULTIPLE MINERALS PO Take 1 tablet by mouth daily.   polyethylene glycol (MIRALAX / GLYCOLAX) 17 g packet Take 17 g by mouth at bedtime.   sennosides-docusate sodium (SENOKOT-S) 8.6-50 MG tablet Take 1 tablet by mouth 2 (two) times daily.   carbidopa-levodopa (SINEMET CR) 50-200 MG tablet TAKE 1 TABLET BY MOUTH TWICE A DAY   [DISCONTINUED] amoxicillin-clavulanate (AUGMENTIN) 875-125 MG tablet Take 1 tablet one hour prior to any dental procedures   [DISCONTINUED] enoxaparin (LOVENOX) 40 MG/0.4ML injection Inject 40 mg into the skin daily.   No facility-administered  encounter medications on file as of 12/16/2021.    Review of Systems:  Review of Systems  Constitutional:  Negative for activity change, appetite change and unexpected weight change.  HENT: Negative.    Respiratory:  Negative for cough and shortness of breath.   Cardiovascular:  Negative for leg swelling.  Gastrointestinal:  Negative for constipation.  Genitourinary:  Negative for frequency.  Musculoskeletal:  Positive for arthralgias. Negative for gait problem and myalgias.  Skin: Negative.  Negative for rash.  Neurological:  Positive for dizziness. Negative for weakness.  Psychiatric/Behavioral:  Negative for confusion and sleep disturbance.   All other systems reviewed and are negative.   Health Maintenance  Topic Date Due   Medicare Annual Wellness (AWV)  Never done   Zoster Vaccines- Shingrix (1 of 2) Never done   Pneumonia Vaccine 57+ Years old (1 - PCV) Never done   TETANUS/TDAP  06/10/2011   Diabetic kidney evaluation - Urine ACR  05/29/2021   OPHTHALMOLOGY EXAM  12/09/2021   FOOT EXAM  12/24/2021   HEMOGLOBIN A1C  02/27/2022   Diabetic kidney evaluation - GFR measurement  12/14/2022   INFLUENZA VACCINE  Completed   COVID-19 Vaccine  Completed   HPV VACCINES  Aged Out    Physical Exam: Vitals:   12/16/21 1258  BP: 122/70  Pulse: 68  Resp: 18  Temp: 97.8 F (36.6 C)  TempSrc: Temporal  SpO2: 96%  Weight: 208 lb 12.8 oz (94.7 kg)  Height: 5\' 11"  (1.803 m)   Body mass index is 29.12 kg/m. Physical Exam Vitals reviewed.  Constitutional:      Appearance: Normal appearance.  HENT:     Head: Normocephalic.     Ears:     Comments: Wax in both ears    Nose: Nose normal.     Mouth/Throat:     Mouth: Mucous membranes are moist.     Pharynx: Oropharynx is clear.  Eyes:     Pupils: Pupils are equal, round, and reactive to light.  Cardiovascular:     Rate and Rhythm: Normal rate and regular rhythm.     Pulses: Normal pulses.     Heart sounds: No murmur  heard. Pulmonary:     Effort: Pulmonary effort is normal. No respiratory distress.     Breath sounds: Normal breath sounds. No rales.  Abdominal:     General: Abdomen is flat. Bowel sounds are normal.     Palpations: Abdomen is soft.  Musculoskeletal:        General: No swelling.     Cervical back: Neck supple.  Skin:    General: Skin is warm.  Neurological:     General: No focal deficit present.     Mental Status: He is alert and oriented to person, place, and time.     Comments: Mild Nystagmus Gait was stable No Vertigo today  Psychiatric:        Mood and Affect: Mood normal.        Thought Content: Thought content normal.     Labs reviewed: Basic Metabolic Panel: Recent Labs    05/29/21 0000 08/27/21 0730 12/13/21 0126 12/13/21 0225  NA 136* 144 139 140  K 4.4 4.2 3.8 3.8  CL 102 108 103 103  CO2 25* 26 23  --   GLUCOSE  --  99 156* 145*  BUN 21 19 25* 27*  CREATININE 0.7 0.79 0.78 0.70  CALCIUM 8.6* 9.6 9.7  --   TSH 2.27  --   --   --    Liver Function Tests: Recent Labs    04/28/21 0705 05/09/21 0000 12/13/21 0126  AST 17  --  24  ALT 21  --  5  ALKPHOS  --   --  92  BILITOT 0.8  --  0.9  PROT 7.0  --  6.4*  ALBUMIN  --  4.3 3.8   No results for input(s): "LIPASE", "AMYLASE" in the last 8760 hours. No results for input(s): "AMMONIA" in the last 8760 hours. CBC: Recent Labs    05/29/21 0000 12/13/21 0126 12/13/21 0225  WBC 6.4 3.5*  --   NEUTROABS 4,653.00 1.7  --   HGB 12.2* 14.3 14.3  HCT 37* 42.2 42.0  MCV  --  92.3  --   PLT 308 202  --    Lipid Panel: Recent Labs    04/28/21 0705  CHOL 154  HDL 56  LDLCALC 84  TRIG 56  CHOLHDL 2.8   Lab Results  Component Value Date   HGBA1C 6.3 (H) 08/27/2021    Procedures since last visit: CT HEAD WO CONTRAST  Result Date: 12/13/2021 CLINICAL DATA:  Dizziness. EXAM: CT HEAD WITHOUT CONTRAST TECHNIQUE: Contiguous axial images were obtained from the base of the skull through the vertex  without intravenous contrast. RADIATION DOSE REDUCTION: This exam was performed according to the departmental dose-optimization program which includes automated exposure control, adjustment of the mA and/or kV according to patient size and/or use of iterative reconstruction technique. COMPARISON:  None Available. FINDINGS: Brain: There is no evidence for acute hemorrhage, hydrocephalus, mass lesion, or abnormal extra-axial fluid collection. No definite CT evidence for acute infarction. Vascular: No hyperdense vessel or unexpected calcification. Skull: No evidence for fracture. No worrisome lytic or sclerotic lesion. Sinuses/Orbits: No acute finding. Other: None. IMPRESSION: No acute intracranial abnormality. Electronically Signed   By: Kennith Center M.D.   On: 12/13/2021 08:16    Assessment/Plan 1. BPPV (benign paroxysmal positional vertigo), bilateral Take Meclizine if have symptoms - Ambulatory referral to ENT  2. 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 Type 2 diabetes mellitus with diabetic neuropathy, with long-term current use of insulin (HCC) A1C low Discussed about decreasing his Insulin He is also thinking of getting [facility] [place] for monitoring  4. Arthritis Osteoarthritis of many joints One finger was swollen can try NSAIDS for few days only  5. Mixed hyperlipidemia On statin  6. Status post left knee replacement Doing well    Labs/tests ordered:  * No order type specified * Next appt:  01/04/2022

## 2021-12-16 NOTE — Patient Instructions (Addendum)
Take Motrin 400 mg with food Once a day for 5 days I am making Referral to Therapy Adriane

## 2021-12-19 ENCOUNTER — Other Ambulatory Visit: Payer: Self-pay | Admitting: Family Medicine

## 2021-12-19 DIAGNOSIS — E1149 Type 2 diabetes mellitus with other diabetic neurological complication: Secondary | ICD-10-CM

## 2021-12-19 DIAGNOSIS — Z981 Arthrodesis status: Secondary | ICD-10-CM

## 2021-12-21 NOTE — Telephone Encounter (Signed)
I do not think refill is appropriate at this time. He was recently seen for vertigo and advised to try meclizine. Gabapentin can have sedative effect. Concerned it will make him a higher fall risk, especially after having knee replacement.

## 2021-12-23 ENCOUNTER — Other Ambulatory Visit: Payer: Self-pay | Admitting: Family Medicine

## 2021-12-23 DIAGNOSIS — Z981 Arthrodesis status: Secondary | ICD-10-CM

## 2021-12-23 DIAGNOSIS — E1149 Type 2 diabetes mellitus with other diabetic neurological complication: Secondary | ICD-10-CM

## 2021-12-24 ENCOUNTER — Other Ambulatory Visit: Payer: Self-pay

## 2021-12-24 DIAGNOSIS — G4762 Sleep related leg cramps: Secondary | ICD-10-CM

## 2021-12-24 MED ORDER — GABAPENTIN 300 MG PO CAPS: 600.0000 mg | ORAL_CAPSULE | Freq: Two times a day (BID) | ORAL | 3 refills | Status: DC

## 2021-12-24 NOTE — Telephone Encounter (Signed)
Patient called and stated that he needed a refill on his Gabapentin.   Confirmed dosage with patient of: Gabapentin 300mg  Two in the morning, One in the afternoon and Two at Bedtime.   This is not the dosage in patient's Current medication list. Is this ok to fill and update Medication list.   Please Advise.   Pended Rx and sent to Dr. for approval.

## 2021-12-30 ENCOUNTER — Encounter: Payer: Medicare Other | Admitting: Family Medicine

## 2022-01-04 ENCOUNTER — Ambulatory Visit: Payer: Medicare Other | Admitting: Orthopedic Surgery

## 2022-01-04 ENCOUNTER — Encounter: Payer: Self-pay | Admitting: Orthopedic Surgery

## 2022-01-04 VITALS — BP 128/78 | HR 74 | Temp 97.3°F | Resp 18 | Ht 71.0 in | Wt 209.0 lb

## 2022-01-04 DIAGNOSIS — Z0000 Encounter for general adult medical examination without abnormal findings: Secondary | ICD-10-CM | POA: Diagnosis not present

## 2022-01-04 NOTE — Patient Instructions (Addendum)
  Mr. Stephen Burns , Thank you for taking time to come for your Medicare Wellness Visit. I appreciate your ongoing commitment to your health goals. Please review the following plan we discussed and let me know if I can assist you in the future.   These are the goals we discussed:  Goals      Weight (lb) < 200 lb (90.7 kg)        This is a list of the screening recommended for you and due dates:  Health Maintenance  Topic Date Due   Zoster (Shingles) Vaccine (1 of 2) Never done   Pneumonia Vaccine (1 - PCV) Never done   Yearly kidney health urinalysis for diabetes  05/29/2021   COVID-19 Vaccine (6 - 2023-24 season) 10/09/2021   Eye exam for diabetics  12/09/2021   Complete foot exam   12/24/2021   Hemoglobin A1C  02/27/2022   Yearly kidney function blood test for diabetes  12/14/2022   Medicare Annual Wellness Visit  01/05/2023   Flu Shot  Completed   HPV Vaccine  Aged Out    Recommend going to local pharmacy to get tetanus and Prevnar 20 vaccine (pneumonia vaccine)

## 2022-01-04 NOTE — Progress Notes (Signed)
Subjective:   Stephen Burns is a 81 y.o. male who presents for Medicare Annual/Subsequent preventive examination.  Place of Service: [facility] clinic Provider: Hazle Nordmann, AGNP-C   Review of Systems     Cardiac Risk Factors include: advanced age (>52men, >33 women);sedentary lifestyle;diabetes mellitus     Objective:    Today's Vitals   01/04/22 1405  BP: 128/78  Pulse: 74  Resp: 18  Temp: (!) 97.3 F (36.3 C)  SpO2: 94%  Weight: 209 lb (94.8 kg)  Height:  (1.803 m)  PainSc: 5    Body mass index is 29.15 kg/m.     12/16/2021    1:03 PM 06/05/2021    1:26 PM 06/02/2021    3:45 PM 05/26/2021    3:14 PM 05/11/2021    8:51 AM 01/22/2021    6:33 PM 10/09/2020    2:47 PM  Advanced Directives  Does Patient Have a Medical Advance Directive? Yes No No No;Yes No;Yes No No  Type of Estate agent of [place];Living will;Out of facility DNR (pink MOST or yellow form)   Healthcare Power of [employer] Power of Attorney    Does patient want to make changes to medical advance directive? No - Patient declined No - Patient declined No - Patient declined No - Patient declined No - Patient declined    Copy of Healthcare Power of Attorney in Chart? No - copy requested   No - copy requested No - copy requested     Would patient like information on creating a medical advance directive?      No - Patient declined No - Patient declined     Significant value    Current Medications (verified) Outpatient Encounter Medications as of 01/04/2022  Medication Sig   acetaminophen (TYLENOL) 500 MG tablet Take 1,000 mg by mouth 4 (four) times daily as needed.   atorvastatin (LIPITOR) 40 MG tablet Take 1 tablet (40 mg total) by mouth daily.   carbidopa-levodopa (SINEMET CR) 50-200 MG tablet TAKE 1 TABLET BY MOUTH TWICE A DAY   fluticasone (FLONASE) 50 MCG/ACT nasal spray Place 1 spray into both nostrils daily as needed for allergies or rhinitis.   gabapentin  (NEURONTIN) 300 MG capsule Take Two capsules in the morning, Take One in the afternoon and Take Two at bedtime.   gabapentin (NEURONTIN) 300 MG capsule Take 2 capsules (600 mg total) by mouth 2 (two) times daily.   Insulin Glargine (BASAGLAR KWIKPEN) 100 UNIT/ML Inject 35 Units into the skin in the morning. E11.40   Insulin Pen Needle (B-D ULTRAFINE III SHORT PEN) 31G X 8 MM MISC USE AS DIRECTED   meclizine (ANTIVERT) 25 MG tablet Take 1 tablet (25 mg total) by mouth 3 (three) times daily as needed for dizziness.   MULTIPLE MINERALS PO Take 1 tablet by mouth daily.   polyethylene glycol (MIRALAX / GLYCOLAX) 17 g packet Take 17 g by mouth at bedtime.   sennosides-docusate sodium (SENOKOT-S) 8.6-50 MG tablet Take 1 tablet by mouth 2 (two) times daily.   No facility-administered encounter medications on file as of 01/04/2022.    Allergies (verified) Sulfa antibiotics   History: Past Medical History:  Diagnosis Date   Arthritis    Complication of anesthesia    Difficult to wake up   Diabetes mellitus without complication (HCC)    Headache    HistREFAEL FULOPy stones    Pulmonary embolism (HCC)    After shoulder surgery   Vertigo  Past Surgical History:  Procedure Laterality Date   ADENOIDECTOMY Bilateral    FINGER SURGERY     Left middle   FOOT SURGERY     forehead surgery     L4-L5 fusion     LITHOTRIPSY     Prostrate     Frozen   SHOULDER SURGERY     Complete replacement to right shoulder   TONSILLECTOMY     TRANSURETHRAL RESECTION OF PROSTATE N/A 09/02/2020   Procedure: TRANSURETHRAL RESECTION OF THE PROSTATE (TURP);  Surgeon: Bjorn PippinWrenn, John, MD;  Location: [facility];  Service: Urology;  Laterality: N/A;   History reviewed. No pertinent family history. Social History   Socioeconomic History   Marital status: Married    Spouse name: Not on file   Number of children: Not on file   Years of education: Not on file   Highest education level: Not on file  Occupational  History   Not on file  Tobacco Use   Smoking status: Never   Smokeless tobacco: Never  Vaping Use   Vaping Use: Never used  Substance and Sexual Activity   Alcohol use: Not Currently   Drug use: Never   Sexual activity: Not on file  Other Topics Concern   Not on file  Social History Narrative   Not on file   Social Determinants of Health   Financial Resource Strain: Low Risk  (01/04/2022)   Overall Financial Resource Strain (CARDIA)    Difficulty of Paying Living Expenses: Not hard at all  Food Insecurity: No Food Insecurity (01/04/2022)   Hunger Vital Sign    Worried About Running Out of Food in the Last Year: Never true    Ran Out of Food in the Last Year: Never true  Transportation Needs: No Transportation Needs (01/04/2022)   PRAPARE - Administrator, Civil ServiceTransportation    Lack of Transportation (Medical): No    Lack of Transportation (Non-Medical): No  Physical Activity: Insufficiently Active (01/04/2022)   Exercise Vital Sign    Days of Exercise per Week: 7 days    Minutes of Exercise per Session: 10 min  Stress: Stress Concern Present (01/04/2022)   [employer] - Occupational Stress Questionnaire    Feeling of Stress : To some extent  Social Connections: Moderately Integrated (01/04/2022)   Social Connection and Isolation Panel [NHANES]    Frequency of Communication with Friends and Family: More than three times a week    Frequency of Social Gatherings with Friends and Family: More than three times a week    Attends Religious Services: More than 4 times per year    Active Member of [employer]ubs or Organizations: No    Attends Engineer, structuralClub or Organization Meetings: Never    Marital Status: Married    Tobacco Counseling Counseling given: Not Answered   Clinical Intake:  Pre-visit preparation completed: Yes  Pain : 0-10 Pain Score: 5  Pain Type: Chronic pain Pain Location: Back Pain Orientation: Lower Pain Descriptors / Indicators: Aching, Burning,  Constant Pain Onset: Today Pain Frequency: Intermittent     BMI - recorded: 29.15 Nutritional Status: BMI 25 -29 Overweight Nutritional Risks: None Diabetes: Yes CBG done?: Yes CBG resulted in Enter/ Edit results?: No Did pt. bring in CBG monitor from home?: No  How often do you need to have someone help you when you read instructions, pamphlets, or other written materials from your doctor or pharmacy?: 1 - Never What is the last grade level you completed in school?: 2 years college- 3  associate degrees  Diabetic?Yes  Interpreter Needed?: No      Activities of Daily Living    01/04/2022    2:27 PM  In your present state of health, do you have any difficulty performing the following activities:  Hearing? 0  Vision? 0  Difficulty concentrating or making decisions? 0  Walking or climbing stairs? 1  Dressing or bathing? 0  Doing errands, shopping? 0  Preparing Food and eating ? N  Using the Toilet? N  In the past six months, have you accidently leaked urine? Y  Do you have problems with loss of bowel control? Y  Managing your Medications? N  Managing your Finances? N  Housekeeping or managing your Housekeeping? N    Patient Care Team: Mahlon Gammon, MD as PCP - General (Internal Medicine)  Indicate any recent Medical Services you may have received from other than [facility] providers in the past year (date may be approximate).     Assessment:   This is a routine wellness examination for Stephen Burns.  Hearing/Vision screen Hearing Screening - Comments:: Ringing in the ear. Vision Screening - Comments:: Do wear glasses on for reading and the other full precsprition  Dietary issues and exercise activities discussed: Exercise limited by: orthopedic condition(s)   Goals Addressed             This Visit's Progress    Weight (lb) < 200 lb (90.7 kg)   209 lb (94.8 kg)      Depression Screen    01/04/2022    1:50 PM  PHQ 2/9 Scores  PHQ - 2 Score 2  PHQ- 9 Score  4    Fall Risk    01/04/2022    2:27 PM 01/04/2022    1:52 PM 04/22/2021    3:04 PM 10/09/2020    2:58 PM 09/25/2020    1:25 PM  Fall Risk   Falls in the past year? 0 0 0 0 0  Number falls in past yr: 0 0 0 0 0  Injury with Fall? 0 0 0 0 0  Risk for fall due to : History of fall(s);Impaired balance/gait;Impaired mobility No Fall Risks No Fall Risks No Fall Risks No Fall Risks  Follow up Falls evaluation completed;Education provided Falls evaluation completed Falls evaluation completed Falls evaluation completed Falls evaluation completed    FALL RISK PREVENTION PERTAINING TO THE HOME:  Any stairs in or around the home? No  If so, are there any without handrails? Yes  Home free of loose throw rugs in walkways, pet beds, electrical cords, etc? Yes  Adequate lighting in your home to reduce risk of falls? Yes   ASSISTIVE DEVICES UTILIZED TO PREVENT FALLS:  Life alert? No  Use of a cane, walker or w/c? Yes  Grab bars in the bathroom? Yes  Shower chair or bench in shower? Yes  Elevated toilet seat or a handicapped toilet? Yes   TIMED UP AND GO:  Was the test performed? No .  Length of time to ambulate 10 feet: N/A sec.   Gait slow and steady with assistive device  Cognitive Function:    01/04/2022    2:11 PM  MMSE - Mini Mental State Exam  Orientation to time 5  Orientation to Place 5  Registration 3  Attention/ Calculation 5  Recall 3  Language- name 2 objects 2  Language- repeat 1  Language- follow 3 step command 3  Language- read & follow direction 1  Write a sentence 1  Copy  design 1  Total score 30        Immunizations Immunization History  Administered Date(s) Administered   Fluad Quad(high Dose 65+) 11/24/2020, 11/25/2021   Influenza, High Dose Seasonal PF 11/24/2020   Influenza-Unspecified 01/08/2002, 12/16/2003, 11/07/2007   PFIZER Comirnaty(Gray Top)Covid-19 Tri-Sucrose Vaccine 03/16/2019, 04/06/2019, 11/05/2019   Pfizer Covid-19 Vaccine Bivalent  Booster 109yrs & up 06/27/2020, 10/28/2020   Td 06/09/2001   Td (Adult),unspecified 06/09/2001    TDAP status: Due, Education has been provided regarding the importance of this vaccine. Advised may receive this vaccine at local pharmacy or Health Dept. Aware to provide a copy of the vaccination record if obtained from local pharmacy or Health Dept. Verbalized acceptance and understanding.  Flu Vaccine status: Up to date  Pneumococcal vaccine status: Due, Education has been provided regarding the importance of this vaccine. Advised may receive this vaccine at local pharmacy or Health Dept. Aware to provide a copy of the vaccination record if obtained from local pharmacy or Health Dept. Verbalized acceptance and understanding.  Covid-19 vaccine status: Completed vaccines  Qualifies for Shingles Vaccine? No   Zostavax completed No   Shingrix Completed?: No.    Education has been provided regarding the importance of this vaccine. Patient has been advised to call insurance company to determine out of pocket expense if they have not yet received this vaccine. Advised may also receive vaccine at local pharmacy or Health Dept. Verbalized acceptance and understanding.  Screening Tests Health Maintenance  Topic Date Due   Zoster Vaccines- Shingrix (1 of 2) Never done   Pneumonia Vaccine 35+ Years old (1 - PCV) Never done   Diabetic kidney evaluation - Urine ACR  05/29/2021   COVID-19 Vaccine (6 - 2023-24 season) 10/09/2021   OPHTHALMOLOGY EXAM  12/09/2021   FOOT EXAM  12/24/2021   HEMOGLOBIN A1C  02/27/2022   Diabetic kidney evaluation - GFR measurement  12/14/2022   Medicare Annual Wellness (AWV)  01/05/2023   INFLUENZA VACCINE  Completed   HPV VACCINES  Aged Out    Health Maintenance  Health Maintenance Due  Topic Date Due   Zoster Vaccines- Shingrix (1 of 2) Never done   Pneumonia Vaccine 30+ Years old (1 - PCV) Never done   Diabetic kidney evaluation - Urine ACR  05/29/2021    COVID-19 Vaccine (6 - 2023-24 season) 10/09/2021   OPHTHALMOLOGY EXAM  12/09/2021   FOOT EXAM  12/24/2021    Colorectal cancer screening: No longer required.   Lung Cancer Screening: (Low Dose CT Chest recommended if Age 53-80 years, 30 pack-year currently smoking OR have quit w/in 15years.) does not qualify.   Lung Cancer Screening Referral: No  Additional Screening:  Hepatitis C Screening: does not qualify; Completed   Vision Screening: Recommended annual ophthalmology exams for early detection of glaucoma and other disorders of the eye. Is the patient up to date with their annual eye exam?  Yes  Who is the provider or what is the name of the office in which the patient attends annual eye exams? Dr. Carlynn Purl If pt is not established with a provider, would they like to be referred to a provider to establish care? No .   Dental Screening: Recommended annual dental exams for proper oral hygiene  Community Resource Referral / Chronic Care Management: CRR required this visit?  No   CCM required this visit?  No      Plan:     I have personally reviewed and noted the following in the patient's chart:  Medical and social history Use of alcohol, tobacco or illicit drugs  Current medications and supplements including opioid prescriptions. Patient is not currently taking opioid prescriptions. Functional ability and status Nutritional status Physical activity Advanced directives List of other physicians Hospitalizations, surgeries, and ER visits in previous 12 months Vitals Screenings to include cognitive, depression, and falls Referrals and appointments  In addition, I have reviewed and discussed with patient certain preventive protocols, quality metrics, and best practice recommendations. A written personalized care plan for preventive services as well as general preventive health recommendations were provided to patient.     Octavia Heir, NP   01/04/2022   Nurse Notes:  Recommend tetanus and prevnar 20 vaccines

## 2022-01-07 ENCOUNTER — Other Ambulatory Visit: Payer: Medicare Other

## 2022-01-07 DIAGNOSIS — E782 Mixed hyperlipidemia: Secondary | ICD-10-CM

## 2022-01-07 DIAGNOSIS — E1140 Type 2 diabetes mellitus with diabetic neuropathy, unspecified: Secondary | ICD-10-CM

## 2022-01-07 LAB — BASIC METABOLIC PANEL
BUN: 24 — AB (ref 4–21)
CO2: 26 — AB (ref 13–22)
Chloride: 107 (ref 99–108)
Glucose: 110
Potassium: 4.3 mEq/L (ref 3.5–5.1)
Sodium: 140 (ref 137–147)

## 2022-01-07 LAB — CBC: RBC: 4.68 (ref 3.87–5.11)

## 2022-01-07 LAB — CBC AND DIFFERENTIAL
HCT: 43 (ref 41–53)
Hemoglobin: 14.7 (ref 13.5–17.5)
Neutrophils Absolute: 3310
Platelets: 226 10*3/uL (ref 150–400)
WBC: 5

## 2022-01-07 LAB — COMPREHENSIVE METABOLIC PANEL
Calcium: 9.4 (ref 8.7–10.7)
eGFR: 90

## 2022-01-13 ENCOUNTER — Encounter: Payer: Self-pay | Admitting: Internal Medicine

## 2022-01-13 ENCOUNTER — Non-Acute Institutional Stay: Payer: Medicare Other | Admitting: Internal Medicine

## 2022-01-13 VITALS — BP 130/64 | HR 73 | Temp 97.9°F | Resp 17 | Ht 71.0 in | Wt 210.9 lb

## 2022-01-13 DIAGNOSIS — Z794 Long term (current) use of insulin: Secondary | ICD-10-CM

## 2022-01-13 DIAGNOSIS — H6123 Impacted cerumen, bilateral: Secondary | ICD-10-CM

## 2022-01-13 DIAGNOSIS — Z96652 Presence of left artificial knee joint: Secondary | ICD-10-CM

## 2022-01-13 DIAGNOSIS — M1990 Unspecified osteoarthritis, unspecified site: Secondary | ICD-10-CM | POA: Diagnosis not present

## 2022-01-13 DIAGNOSIS — E782 Mixed hyperlipidemia: Secondary | ICD-10-CM

## 2022-01-13 DIAGNOSIS — H9313 Tinnitus, bilateral: Secondary | ICD-10-CM | POA: Diagnosis not present

## 2022-01-13 DIAGNOSIS — E1140 Type 2 diabetes mellitus with diabetic neuropathy, unspecified: Secondary | ICD-10-CM | POA: Diagnosis not present

## 2022-01-13 NOTE — Progress Notes (Addendum)
[facility]:  [facility] (12)  Provider:   Code Status:  Goals of Care:     01/13/2022    3:42 PM  Advanced Directives  Does Patient Have a Medical Advance Directive? Yes  Type of Estate agent of [place];Living will;Out of facility DNR (pink MOST or yellow form)  Copy of Healthcare Power of Attorney in Chart? No - copy requested     Chief Complaint  Patient presents with   Medical Management of Chronic Issues    4 month follow up and discuss the most recent vertigo    HPI: Patient is a 81 y.o. male seen today for medical management of chronic diseases.    Patient has h/o Type 2 diabetes on insulin States his CBGs run anywhere from 80-1 30 Considering getting Free libre Does not want me to change the dose of insulin which I want to decrease right now  BPPV Is getting PRN therapy with Adriann Also uses PRN meclizine  innitus Longstanding history he has never seen an ENT and is now interested now Hearing loss  Swelling in one joint of left hand  Did get better with Motrin  H/o Right Shoulder replacement, h/o Lumbar Fusion, Arthritis,  Also h/o PE after shoulder replacement, HLD  He underwent TKA on 04/11 H/o Lung Nodules  Mostly benign follows with Dr Delton Coombes Since then uses the stick to walk.  Recent [facility] [place] Past Medical History:  Diagnosis Date   Arthritis    Complication of anesthesia    Difficult to wake up   Diabetes mellitus without complication (HCC)    Headache    History of kidney stones    Pulmonary embolism (HCC)    After shoulder surgery   Vertigo     Past Surgical History:  Procedure Laterality Date   ADENOIDECTOMY Bilateral    FINGER SURGERY     Left middle   FOOT SURGERY     forehead surgery     L4-L5 fusion     LITHOTRIPSY     Prostrate     Frozen   SHOULDER SURGERY     Complete replacement to right shoulder   TONSILLECTOMY     TRANSURETHRAL RESECTION  OF PROSTATE N/A 09/02/2020   Procedure: TRANSURETHRAL RESECTION OF THE PROSTATE (TURP);  Surgeon: Bjorn Pippin, MD;  Location: [facility];  Service: Urology;  Laterality: N/A;    Allergies  Allergen Reactions   Sulfa Antibiotics Swelling    Joint swelling    Outpatient Encounter Medications as of 01/13/2022  Medication Sig   acetaminophen (TYLENOL) 500 MG tablet Take 1,000 mg by mouth 4 (four) times daily as needed.   atorvastatin (LIPITOR) 40 MG tablet Take 1 tablet (40 mg total) by mouth daily.   carbidopa-levodopa (SINEMET CR) 50-200 MG tablet TAKE 1 TABLET BY MOUTH TWICE A DAY   fluticasone (FLONASE) 50 MCG/ACT nasal spray Place 1 spray into both nostrils daily as needed for allergies or rhinitis.   gabapentin (NEURONTIN) 300 MG capsule Take Two capsules in the morning, Take One in the afternoon and Take Two at bedtime.   Insulin Glargine (BASAGLAR KWIKPEN) 100 UNIT/ML Inject 35 Units into the skin in the morning. E11.40   Insulin Pen Needle (B-D ULTRAFINE III SHORT PEN) 31G X 8 MM MISC USE AS DIRECTED   meclizine (ANTIVERT) 25 MG tablet Take 1 tablet (25 mg total) by mouth 3 (three) times daily as needed for dizziness.  MULTIPLE MINERALS PO Take 1 tablet by mouth daily.   polyethylene glycol (MIRALAX / GLYCOLAX) 17 g packet Take 17 g by mouth at bedtime.   [DISCONTINUED] gabapentin (NEURONTIN) 300 MG capsule Take 2 capsules (600 mg total) by mouth 2 (two) times daily.   [DISCONTINUED] sennosides-docusate sodium (SENOKOT-S) 8.6-50 MG tablet Take 1 tablet by mouth 2 (two) times daily.   No facility-administered encounter medications on file as of 01/13/2022.    Review of Systems:  Review of Systems  Constitutional:  Negative for activity change, appetite change and unexpected weight change.  HENT:  Positive for hearing loss and tinnitus.   Respiratory:  Negative for cough and shortness of breath.   Cardiovascular:  Negative for leg swelling.  Gastrointestinal:  Negative for  constipation.  Genitourinary:  Negative for frequency.  Musculoskeletal:  Negative for arthralgias, gait problem and myalgias.  Skin: Negative.  Negative for rash.  Neurological:  Negative for dizziness and weakness.  Psychiatric/Behavioral:  Negative for confusion and sleep disturbance.   All other systems reviewed and are negative.   Health Maintenance  Topic Date Due   Zoster Vaccines- Shingrix (1 of 2) Never done   Pneumonia Vaccine 49+ Years old (1 - PCV) Never done   DTaP/Tdap/Td (3 - Tdap) 06/10/2011   Diabetic kidney evaluation - Urine ACR  05/29/2021   COVID-19 Vaccine (6 - 2023-24 season) 10/09/2021   OPHTHALMOLOGY EXAM  12/09/2021   FOOT EXAM  12/24/2021   HEMOGLOBIN A1C  02/27/2022   Medicare Annual Wellness (AWV)  01/05/2023   Diabetic kidney evaluation - GFR measurement  01/08/2023   INFLUENZA VACCINE  Completed   HPV VACCINES  Aged Out    Physical Exam: Vitals:   01/13/22 1538  BP: 130/64  Pulse: 73  Resp: 17  Temp: 97.9 F (36.6 C)  TempSrc: Temporal  SpO2: 96%  Weight: 210 lb 14.4 oz (95.7 kg)  Height: 5\' 11"  (1.803 m)   Body mass index is 29.41 kg/m. Physical Exam Vitals reviewed.  Constitutional:      Appearance: Normal appearance.  HENT:     Head: Normocephalic.     Right Ear: Tympanic membrane normal.     Left Ear: Tympanic membrane normal.     Ears:     Comments: Wax Both Ears    Nose: Nose normal.     Mouth/Throat:     Mouth: Mucous membranes are moist.     Pharynx: Oropharynx is clear.  Eyes:     Pupils: Pupils are equal, round, and reactive to light.  Cardiovascular:     Rate and Rhythm: Normal rate and regular rhythm.     Pulses: Normal pulses.     Heart sounds: No murmur heard. Pulmonary:     Effort: Pulmonary effort is normal. No respiratory distress.     Breath sounds: Normal breath sounds. No rales.  Abdominal:     General: Abdomen is flat. Bowel sounds are normal.     Palpations: Abdomen is soft.  Musculoskeletal:         General: No swelling.     Cervical back: Neck supple.  Skin:    General: Skin is warm.  Neurological:     General: No focal deficit present.     Mental Status: He is alert and oriented to person, place, and time.  Psychiatric:        Mood and Affect: Mood normal.        Thought Content: Thought content normal.     Labs  reviewed: Basic Metabolic Panel: Recent Labs    05/29/21 0000 08/27/21 0730 12/13/21 0126 12/13/21 0225 01/07/22 0000  NA 136* 144 139 140 140  K 4.4 4.2 3.8 3.8 4.3  CL 102 108 103 103 107  CO2 25* 26 23  --  26*  GLUCOSE  --  99 156* 145*  --   BUN 21 19 25* 27* 24*  CREATININE 0.7 0.79 0.78 0.70  --   CALCIUM 8.6* 9.6 9.7  --  9.4  TSH 2.27  --   --   --   --    Liver Function Tests: Recent Labs    04/28/21 0705 05/09/21 0000 12/13/21 0126  AST 17  --  24  ALT 21  --  5  ALKPHOS  --   --  92  BILITOT 0.8  --  0.9  PROT 7.0  --  6.4*  ALBUMIN  --  4.3 3.8   No results for input(s): "LIPASE", "AMYLASE" in the last 8760 hours. No results for input(s): "AMMONIA" in the last 8760 hours. CBC: Recent Labs    05/29/21 0000 12/13/21 0126 12/13/21 0225 01/07/22 0000  WBC 6.4 3.5*  --  5.0  NEUTROABS 4,653.00 1.7  --  3,310.00  HGB 12.2* 14.3 14.3 14.7  HCT 37* 42.2 42.0 43  MCV  --  92.3  --   --   PLT 308 202  --  226   Lipid Panel: Recent Labs    04/28/21 0705  CHOL 154  HDL 56  LDLCALC 84  TRIG 56  CHOLHDL 2.8   Lab Results  Component Value Date   HGBA1C 6.3 (H) 08/27/2021    Procedures since last visit: No results found.  Assessment/Plan 1. Type 2 diabetes mellitus with diabetic neuropathy, with long-term current use of insulin (HCC) We discussed about his Low CBGS sometimes But he does not want to change the dose of his insulin right  now - Hemoglobin A1c; Future - Lipid panel; Future  2. Bilateral impacted cerumen  - Ambulatory referral to ENT  3. 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 Tinnitus of both ears ENT referal  5. Mixed hyperlipidemia On Statin  6. Status post left knee replacement Doing well    Labs/tests ordered:  * No order type specified * Next appt:  01/13/2022

## 2022-01-13 NOTE — Progress Notes (Signed)
01/07/2022  

## 2022-01-21 ENCOUNTER — Other Ambulatory Visit: Payer: Medicare Other

## 2022-01-21 DIAGNOSIS — E1140 Type 2 diabetes mellitus with diabetic neuropathy, unspecified: Secondary | ICD-10-CM

## 2022-01-21 LAB — LIPID PANEL
Cholesterol: 120 mg/dL (ref <200)
HDL: 43 mg/dL (ref >=40)
LDL Cholesterol (Calc): 63 mg/dL (calc)
Non-HDL Cholesterol (Calc): 77 mg/dL (calc) (ref <130)
Total CHOL/HDL Ratio: 2.8 (calc) (ref <5.0)
Triglycerides: 67 mg/dL (ref <150)

## 2022-01-22 LAB — HEMOGLOBIN A1C
Hgb A1c MFr Bld: 6.9 % of total Hgb — ABNORMAL HIGH (ref <5.7)
Mean Plasma Glucose: 151 mg/dL
eAG (mmol/L): 8.4 mmol/L

## 2022-01-30 ENCOUNTER — Other Ambulatory Visit: Payer: Self-pay | Admitting: Family Medicine

## 2022-01-30 DIAGNOSIS — E782 Mixed hyperlipidemia: Secondary | ICD-10-CM

## 2022-02-17 ENCOUNTER — Other Ambulatory Visit: Payer: Self-pay

## 2022-02-17 MED ORDER — AMOXICILLIN 250 MG PO CAPS: 250.0000 mg | ORAL_CAPSULE | Freq: Three times a day (TID) | ORAL | 0 refills | Status: DC

## 2022-02-17 NOTE — Telephone Encounter (Signed)
Patient called for medication refill for amoxicillin for upcoming dental work

## 2022-03-03 LAB — HM DIABETES EYE EXAM

## 2022-03-04 ENCOUNTER — Encounter: Payer: Self-pay | Admitting: *Deleted

## 2022-03-09 ENCOUNTER — Telehealth: Payer: Self-pay | Admitting: *Deleted

## 2022-03-09 ENCOUNTER — Other Ambulatory Visit: Payer: Self-pay | Admitting: Orthopedic Surgery

## 2022-03-09 DIAGNOSIS — H9193 Unspecified hearing loss, bilateral: Secondary | ICD-10-CM

## 2022-03-09 NOTE — Telephone Encounter (Signed)
Patient requesting Referral to [facility] for Hearing Evaluation. Patient has an appointment next week but they told him that if he gets a referral through his PCP it will be covered by his insurance.   Requesting for you to place a referral to [facility].   Please Advise.   Pended Order for approval.

## 2022-03-09 NOTE — Telephone Encounter (Signed)
argo, Amy E, NP  You27 minutes ago (12:06 PM)    Referral made to [facility].

## 2022-03-17 ENCOUNTER — Encounter: Payer: Self-pay | Admitting: Internal Medicine

## 2022-03-17 ENCOUNTER — Non-Acute Institutional Stay: Payer: Medicare Other | Admitting: Internal Medicine

## 2022-03-17 VITALS — BP 132/74 | HR 66 | Temp 97.9°F | Resp 16 | Ht 71.0 in | Wt 209.0 lb

## 2022-03-17 DIAGNOSIS — E782 Mixed hyperlipidemia: Secondary | ICD-10-CM | POA: Diagnosis not present

## 2022-03-17 DIAGNOSIS — E1140 Type 2 diabetes mellitus with diabetic neuropathy, unspecified: Secondary | ICD-10-CM

## 2022-03-17 DIAGNOSIS — G4762 Sleep related leg cramps: Secondary | ICD-10-CM

## 2022-03-17 DIAGNOSIS — H9193 Unspecified hearing loss, bilateral: Secondary | ICD-10-CM

## 2022-03-17 DIAGNOSIS — Z794 Long term (current) use of insulin: Secondary | ICD-10-CM

## 2022-03-17 DIAGNOSIS — M25512 Pain in left shoulder: Secondary | ICD-10-CM | POA: Diagnosis not present

## 2022-03-17 DIAGNOSIS — H8113 Benign paroxysmal vertigo, bilateral: Secondary | ICD-10-CM

## 2022-03-17 DIAGNOSIS — G8929 Other chronic pain: Secondary | ICD-10-CM

## 2022-03-17 NOTE — Progress Notes (Signed)
Location:  [place] of Service:  Clinic (12)  Provider:   Code Status:  Goals of Care:     03/17/2022    2:38 PM  Advanced Directives  Does Patient Have a Medical Advance Directive? Yes  Type of Paramedic of [place];Living will;Out of facility DNR (pink MOST or yellow form)  Does patient want to make changes to medical advance directive? No - Patient declined  Copy of [place] in Chart? No - copy requested     Chief Complaint  Patient presents with   Acute Visit    Patient states he is here for left shoulder pain and wants to get his hearing checked . Patient would like 2 referrals     HPI: Patient is a 82 y.o. male seen today for medical management of chronic diseases.    Lives in [place] with his wife  Patient has h/o Type 2 diabetes on insulin States his CBGs run anywhere from 110-140 Considering getting Free libre/Doxi com A1C good limits    BPPV Is getting PRN therapy with [facility] in [place] Needs Referral Again Also uses PRN meclizine   Tinnitus Longstanding history he has never seen an ENT and is now interested now Did not make appointment as they are Part of [facility]  Hearing loss  Wants referal  Swelling in one joint of left hand  Did get better with Motrin   Left Shoulder pain Wants referral to DR Supple  H/o Right Shoulder replacement, h/o Lumbar Fusion, Arthritis,  Also h/o PE after shoulder replacement, HLD  He underwent TKA on 04/11 H/o Lung Nodules  Mostly benign follows with Dr Lamonte Sakai Since then uses the stick to walk.  Recent move to friend home [place]  Past Medical History:  Diagnosis Date   Arthritis    Complication of anesthesia    Difficult to wake up   Diabetes mellitus without complication ([place])    Headache    History of kidney stones    Pulmonary embolism (HCC)    After shoulder surgery   Vertigo     Past Surgical History:  Procedure Laterality Date    ADENOIDECTOMY Bilateral    FINGER SURGERY     Left middle   FOOT SURGERY     forehead surgery     L4-L5 fusion     LITHOTRIPSY     Prostrate     Frozen   SHOULDER SURGERY     Complete replacement to right shoulder   TONSILLECTOMY     TRANSURETHRAL RESECTION OF PROSTATE N/A 09/02/2020   Procedure: TRANSURETHRAL RESECTION OF THE PROSTATE (TURP);  Surgeon: Irine Seal, MD;  Location: [facility];  Service: Urology;  Laterality: N/A;    Allergies  Allergen Reactions   Sulfa Antibiotics Swelling    Joint swelling    Outpatient Encounter Medications as of 03/17/2022  Medication Sig   acetaminophen (TYLENOL) 500 MG tablet Take 500 mg by mouth 3 (three) times daily.   amoxicillin (AMOXIL) 250 MG capsule Take 1 capsule (250 mg total) by mouth 3 (three) times daily.   atorvastatin (LIPITOR) 40 MG tablet Take 1 tablet (40 mg total) by mouth daily.   carbidopa-levodopa (SINEMET CR) 50-200 MG tablet TAKE 1 TABLET BY MOUTH TWICE A DAY   fluticasone (FLONASE) 50 MCG/ACT nasal spray Place 1 spray into both nostrils daily as needed for allergies or rhinitis.   gabapentin (NEURONTIN) 300 MG capsule Take Two capsules in the morning, Take  One in the afternoon and Take Two at bedtime.   Insulin Glargine (BASAGLAR KWIKPEN) 100 UNIT/ML Inject 35 Units into the skin in the morning. E11.40   Insulin Pen Needle (B-D ULTRAFINE III SHORT PEN) 31G X 8 MM MISC USE AS DIRECTED   meclizine (ANTIVERT) 25 MG tablet Take 1 tablet (25 mg total) by mouth 3 (three) times daily as needed for dizziness.   MULTIPLE MINERALS PO Take 1 tablet by mouth daily.   polyethylene glycol (MIRALAX / GLYCOLAX) 17 g packet Take 17 g by mouth at bedtime.   No facility-administered encounter medications on file as of 03/17/2022.    Review of Systems:  Review of Systems  Constitutional:  Negative for activity change, appetite change and unexpected weight change.  HENT:  Positive for hearing loss.   Respiratory:  Negative for cough and  shortness of breath.   Cardiovascular:  Negative for leg swelling.  Gastrointestinal:  Negative for constipation.  Genitourinary:  Negative for frequency.  Musculoskeletal:  Negative for arthralgias, gait problem and myalgias.  Skin: Negative.  Negative for rash.  Neurological:  Positive for dizziness. Negative for weakness.  Psychiatric/Behavioral:  Negative for confusion and sleep disturbance.   All other systems reviewed and are negative.   Health Maintenance  Topic Date Due   Zoster Vaccines- Shingrix (1 of 2) Never done   DTaP/Tdap/Td (3 - Tdap) 06/10/2011   Diabetic kidney evaluation - Urine ACR  05/29/2021   FOOT EXAM  12/24/2021   COVID-19 Vaccine (7 - 2023-24 season) 02/09/2022   HEMOGLOBIN A1C  07/23/2022   Medicare Annual Wellness (AWV)  01/05/2023   Diabetic kidney evaluation - eGFR measurement  01/08/2023   OPHTHALMOLOGY EXAM  03/04/2023   Pneumonia Vaccine 76+ Years old  Completed   INFLUENZA VACCINE  Completed   HPV VACCINES  Aged Out    Physical Exam: Vitals:   03/17/22 1436  BP: 132/74  Pulse: 66  Resp: 16  Temp: 97.9 F (36.6 C)  TempSrc: Temporal  SpO2: 98%  Weight: 209 lb (94.8 kg)  Height: 5\' 11"  (1.803 m)   Body mass index is 29.15 kg/m. Physical Exam Vitals reviewed.  Constitutional:      Appearance: Normal appearance.  HENT:     Head: Normocephalic.     Nose: Nose normal.     Mouth/Throat:     Mouth: Mucous membranes are moist.     Pharynx: Oropharynx is clear.  Eyes:     Pupils: Pupils are equal, round, and reactive to light.  Cardiovascular:     Rate and Rhythm: Normal rate and regular rhythm.     Pulses: Normal pulses.     Heart sounds: No murmur heard. Pulmonary:     Effort: Pulmonary effort is normal. No respiratory distress.     Breath sounds: Normal breath sounds. No rales.  Abdominal:     General: Abdomen is flat. Bowel sounds are normal.     Palpations: Abdomen is soft.  Musculoskeletal:        General: No swelling.      Cervical back: Neck supple.  Skin:    General: Skin is warm.  Neurological:     General: No focal deficit present.     Mental Status: He is alert and oriented to person, place, and time.     Comments: No Nystagmus  Psychiatric:        Mood and Affect: Mood normal.        Thought Content: Thought content normal.  Labs reviewed: Basic Metabolic Panel: Recent Labs    05/29/21 0000 08/27/21 0730 12/13/21 0126 12/13/21 0225 01/07/22 0000  NA 136* 144 139 140 140  K 4.4 4.2 3.8 3.8 4.3  CL 102 108 103 103 107  CO2 25* 26 23  --  26*  GLUCOSE  --  99 156* 145*  --   BUN 21 19 25* 27* 24*  CREATININE 0.7 0.79 0.78 0.70  --   CALCIUM 8.6* 9.6 9.7  --  9.4  TSH 2.27  --   --   --   --    Liver Function Tests: Recent Labs    04/28/21 0705 05/09/21 0000 12/13/21 0126  AST 17  --  24  ALT 21  --  5  ALKPHOS  --   --  92  BILITOT 0.8  --  0.9  PROT 7.0  --  6.4*  ALBUMIN  --  4.3 3.8   No results for input(s): "LIPASE", "AMYLASE" in the last 8760 hours. No results for input(s): "AMMONIA" in the last 8760 hours. CBC: Recent Labs    05/29/21 0000 12/13/21 0126 12/13/21 0225 01/07/22 0000  WBC 6.4 3.5*  --  5.0  NEUTROABS 4,653.00 1.7  --  3,310.00  HGB 12.2* 14.3 14.3 14.7  HCT 37* 42.2 42.0 43  MCV  --  92.3  --   --   PLT 308 202  --  226   Lipid Panel: Recent Labs    04/28/21 0705 01/21/22 0749  CHOL 154 120  HDL 56 43  LDLCALC 84 63  TRIG 56 67  CHOLHDL 2.8 2.8   Lab Results  Component Value Date   HGBA1C 6.9 (H) 01/21/2022    Procedures since last visit: No results found.  Assessment/Plan 1. Bilateral hearing loss, unspecified hearing loss type  - Ambulatory referral to Audiology  2. Type 2 diabetes mellitus with diabetic neuropathy, with long-term current use of insulin (HCC) A1C good Up to date on his Eye exam   3. Chronic left shoulder pain  - Ambulatory referral to Orthopedic Surgery  4. Mixed hyperlipidemia LDL good  levels On statin  5. Nocturnal leg cramps On Neurontin and Sinemet  6. BPPV (benign paroxysmal positional vertigo), bilateral Meclizine PRN Therapy referral in [place] 7 Combination of Hearing Loss Tinnitus and BPPV Possible [facility] Made ENT referral but he is not gone yet as it is part of [facility] Will discuss next visit    Labs/tests ordered:  * No order type specified * Next appt:  04/14/2022

## 2022-04-07 ENCOUNTER — Other Ambulatory Visit: Payer: Self-pay | Admitting: Family Medicine

## 2022-04-07 DIAGNOSIS — E782 Mixed hyperlipidemia: Secondary | ICD-10-CM

## 2022-04-09 ENCOUNTER — Other Ambulatory Visit: Payer: Self-pay | Admitting: Internal Medicine

## 2022-04-09 DIAGNOSIS — E1140 Type 2 diabetes mellitus with diabetic neuropathy, unspecified: Secondary | ICD-10-CM

## 2022-04-09 DIAGNOSIS — E782 Mixed hyperlipidemia: Secondary | ICD-10-CM

## 2022-04-14 ENCOUNTER — Encounter: Payer: Medicare Other | Admitting: Internal Medicine

## 2022-04-16 DIAGNOSIS — R2681 Unsteadiness on feet: Secondary | ICD-10-CM | POA: Diagnosis not present

## 2022-04-16 DIAGNOSIS — H8110 Benign paroxysmal vertigo, unspecified ear: Secondary | ICD-10-CM | POA: Diagnosis not present

## 2022-04-17 ENCOUNTER — Other Ambulatory Visit: Payer: Self-pay | Admitting: Family Medicine

## 2022-04-17 DIAGNOSIS — Z981 Arthrodesis status: Secondary | ICD-10-CM

## 2022-04-21 DIAGNOSIS — M25511 Pain in right shoulder: Secondary | ICD-10-CM | POA: Diagnosis not present

## 2022-04-21 DIAGNOSIS — M25512 Pain in left shoulder: Secondary | ICD-10-CM | POA: Diagnosis not present

## 2022-04-21 DIAGNOSIS — M19012 Primary osteoarthritis, left shoulder: Secondary | ICD-10-CM | POA: Diagnosis not present

## 2022-04-26 ENCOUNTER — Other Ambulatory Visit: Payer: Medicare HMO

## 2022-04-27 DIAGNOSIS — E1140 Type 2 diabetes mellitus with diabetic neuropathy, unspecified: Secondary | ICD-10-CM | POA: Diagnosis not present

## 2022-04-27 DIAGNOSIS — E782 Mixed hyperlipidemia: Secondary | ICD-10-CM | POA: Diagnosis not present

## 2022-04-27 DIAGNOSIS — Z794 Long term (current) use of insulin: Secondary | ICD-10-CM | POA: Diagnosis not present

## 2022-04-28 LAB — CBC WITH DIFFERENTIAL/PLATELET
Absolute Monocytes: 414 cells/uL (ref 200–950)
Basophils Absolute: 30 cells/uL (ref 0–200)
Basophils Relative: 0.8 %
Eosinophils Absolute: 110 cells/uL (ref 15–500)
Eosinophils Relative: 2.9 %
HCT: 43.4 % (ref 38.5–50.0)
Hemoglobin: 14.6 g/dL (ref 13.2–17.1)
Lymphs Abs: 939 cells/uL (ref 850–3900)
MCH: 30.4 pg (ref 27.0–33.0)
MCHC: 33.6 g/dL (ref 32.0–36.0)
MCV: 90.4 fL (ref 80.0–100.0)
MPV: 10.1 fL (ref 7.5–12.5)
Monocytes Relative: 10.9 %
Neutro Abs: 2307 cells/uL (ref 1500–7800)
Neutrophils Relative %: 60.7 %
Platelets: 251 10*3/uL (ref 140–400)
RBC: 4.8 10*6/uL (ref 4.20–5.80)
RDW: 12.3 % (ref 11.0–15.0)
Total Lymphocyte: 24.7 %
WBC: 3.8 10*3/uL (ref 3.8–10.8)

## 2022-04-28 LAB — HEMOGLOBIN A1C
Hgb A1c MFr Bld: 7.2 % of total Hgb — ABNORMAL HIGH (ref <5.7)
Mean Plasma Glucose: 160 mg/dL
eAG (mmol/L): 8.9 mmol/L

## 2022-04-28 LAB — TSH: TSH: 1.26 mIU/L (ref 0.40–4.50)

## 2022-04-28 LAB — COMPLETE METABOLIC PANEL WITH GFR
AG Ratio: 1.4 (calc) (ref 1.0–2.5)
ALT: 5 U/L — ABNORMAL LOW (ref 9–46)
AST: 15 U/L (ref 10–35)
Albumin: 4 g/dL (ref 3.6–5.1)
Alkaline phosphatase (APISO): 89 U/L (ref 35–144)
BUN: 25 mg/dL (ref 7–25)
CO2: 29 mmol/L (ref 20–32)
Calcium: 9.4 mg/dL (ref 8.6–10.3)
Chloride: 106 mmol/L (ref 98–110)
Creat: 0.82 mg/dL (ref 0.70–1.22)
Globulin: 2.8 g/dL (calc) (ref 1.9–3.7)
Glucose, Bld: 128 mg/dL — ABNORMAL HIGH (ref 65–99)
Potassium: 4.6 mmol/L (ref 3.5–5.3)
Sodium: 141 mmol/L (ref 135–146)
Total Bilirubin: 0.6 mg/dL (ref 0.2–1.2)
Total Protein: 6.8 g/dL (ref 6.1–8.1)
eGFR: 89 mL/min/{1.73_m2} (ref >=60)

## 2022-04-28 LAB — LIPID PANEL
Cholesterol: 113 mg/dL (ref <200)
HDL: 39 mg/dL — ABNORMAL LOW (ref >=40)
LDL Cholesterol (Calc): 60 mg/dL (calc)
Non-HDL Cholesterol (Calc): 74 mg/dL (calc) (ref <130)
Total CHOL/HDL Ratio: 2.9 (calc) (ref <5.0)
Triglycerides: 61 mg/dL (ref <150)

## 2022-06-02 ENCOUNTER — Non-Acute Institutional Stay: Payer: Medicare HMO | Admitting: Internal Medicine

## 2022-06-02 ENCOUNTER — Encounter: Payer: Self-pay | Admitting: Internal Medicine

## 2022-06-02 VITALS — BP 126/68 | HR 77 | Temp 97.8°F | Resp 16 | Ht 71.0 in | Wt 212.0 lb

## 2022-06-02 DIAGNOSIS — H9193 Unspecified hearing loss, bilateral: Secondary | ICD-10-CM | POA: Diagnosis not present

## 2022-06-02 DIAGNOSIS — Z794 Long term (current) use of insulin: Secondary | ICD-10-CM | POA: Diagnosis not present

## 2022-06-02 DIAGNOSIS — H9313 Tinnitus, bilateral: Secondary | ICD-10-CM

## 2022-06-02 DIAGNOSIS — R911 Solitary pulmonary nodule: Secondary | ICD-10-CM | POA: Diagnosis not present

## 2022-06-02 DIAGNOSIS — G8929 Other chronic pain: Secondary | ICD-10-CM

## 2022-06-02 DIAGNOSIS — E782 Mixed hyperlipidemia: Secondary | ICD-10-CM | POA: Diagnosis not present

## 2022-06-02 DIAGNOSIS — H8113 Benign paroxysmal vertigo, bilateral: Secondary | ICD-10-CM | POA: Diagnosis not present

## 2022-06-02 DIAGNOSIS — G4762 Sleep related leg cramps: Secondary | ICD-10-CM

## 2022-06-02 DIAGNOSIS — E1140 Type 2 diabetes mellitus with diabetic neuropathy, unspecified: Secondary | ICD-10-CM | POA: Diagnosis not present

## 2022-06-02 DIAGNOSIS — M25512 Pain in left shoulder: Secondary | ICD-10-CM

## 2022-06-06 NOTE — Progress Notes (Signed)
[facility]  [facility] Service:  Clinic (12)  Provider:   Code Status:  Goals of Care:     06/02/2022    1:00 PM  Advanced Directives  Does Patient Have a Medical Advance Directive? Yes  Type of Estate agent of [place];Living will  Does patient want to make changes to medical advance directive? No - Patient declined  Copy of Healthcare Power of Attorney in Chart? No - copy requested     Chief Complaint  Patient presents with   Medical Management of Chronic Issues    Follow up and patient states he is having some shoulder pain   Immunizations    Discussed the need for tetanus and flu shot   Health Maintenance    Patient [place] due for foot exam and Microalbumin    HPI: Patient is a 82 y.o. male seen today for medical management of chronic diseases.   Patient has h/o Type 2 diabetes on insulin  CBGS 140-175 A1C elevated this time Says he has been eating more Chips BPPV Resolved after therapy Does home Therapy which helps Tinnitus with hearing loss also present H/o Lung Nodules  Mostly benign follows with Dr Delton Coombes H/o Right Shoulder replacement, h/o Lumbar Fusion, Arthritis,  Also h/o PE after shoulder replacement, HLD  He underwent TKA on 04/11  Plan for left Shoulder surgery with Dr Rennis Chris   Past Medical History:  Diagnosis Date   Arthritis    Complication of anesthesia    Difficult to wake up   Diabetes mellitus without complication (HCC)    Headache    History of kidney stones    Pulmonary embolism (HCC)    After shoulder surgery   Vertigo     Past Surgical History:  Procedure Laterality Date   ADENOIDECTOMY Bilateral    FINGER SURGERY     Left middle   FOOT SURGERY     forehead surgery     L4-L5 fusion     LITHOTRIPSY     Prostrate     Frozen   SHOULDER SURGERY     Complete replacement to right shoulder   TONSILLECTOMY     TRANSURETHRAL RESECTION OF PROSTATE N/A 09/02/2020   Procedure: TRANSURETHRAL  RESECTION OF THE PROSTATE (TURP);  Surgeon: Bjorn Pippin, MD;  Location: [facility];  Service: Urology;  Laterality: N/A;    Allergies  Allergen Reactions   Sulfa Antibiotics Swelling    Joint swelling    Outpatient Encounter Medications as of 06/02/2022  Medication Sig   acetaminophen (TYLENOL) 500 MG tablet Take 500 mg by mouth 3 (three) times daily.   amoxicillin (AMOXIL) 250 MG capsule Take 1 capsule (250 mg total) by mouth 3 (three) times daily.   atorvastatin (LIPITOR) 40 MG tablet Take 1 tablet (40 mg total) by mouth daily. E78.2   carbidopa-levodopa (SINEMET CR) 50-200 MG tablet TAKE 1 TABLET BY MOUTH TWICE A DAY   fluticasone (FLONASE) 50 MCG/ACT nasal spray Place 1 spray into both nostrils daily as needed for allergies or rhinitis.   gabapentin (NEURONTIN) 300 MG capsule Take Two capsules in the morning, Take One in the afternoon and Take Two at bedtime.   Insulin Glargine (BASAGLAR KWIKPEN) 100 UNIT/ML Inject 35 Units into the skin in the morning. E11.40   Insulin Pen Needle (B-D ULTRAFINE III SHORT PEN) 31G X 8 MM MISC USE AS DIRECTED   meclizine (ANTIVERT) 25 MG tablet Take 1 tablet (25 mg total) by mouth 3 (  three) times daily as needed for dizziness.   MULTIPLE MINERALS PO Take 1 tablet by mouth daily.   polyethylene glycol (MIRALAX / GLYCOLAX) 17 g packet Take 17 g by mouth at bedtime.   No facility-administered encounter medications on file as of 06/02/2022.    Review of Systems:  Review of Systems  Constitutional:  Negative for activity change, appetite change and unexpected weight change.  HENT: Negative.    Respiratory:  Negative for cough and shortness of breath.   Cardiovascular:  Negative for leg swelling.  Gastrointestinal:  Negative for constipation.  Genitourinary:  Negative for frequency.  Musculoskeletal:  Positive for arthralgias. Negative for gait problem and myalgias.  Skin: Negative.  Negative for rash.  Neurological:  Positive for dizziness. Negative for  weakness.  Psychiatric/Behavioral:  Negative for confusion and sleep disturbance.   All other systems reviewed and are negative.   Health Maintenance  Topic Date Due   Zoster Vaccines- Shingrix (1 of 2) Never done   DTaP/Tdap/Td (4 - Tdap) 06/10/2011   Diabetic kidney evaluation - Urine ACR  05/29/2021   FOOT EXAM  12/24/2021   COVID-19 Vaccine (7 - 2023-24 season) 06/18/2022 (Originally 02/09/2022)   INFLUENZA VACCINE  09/09/2022   HEMOGLOBIN A1C  10/28/2022   Medicare Annual Wellness (AWV)  01/05/2023   OPHTHALMOLOGY EXAM  03/04/2023   Diabetic kidney evaluation - eGFR measurement  04/27/2023   Pneumonia Vaccine 4+ Years old  Completed   HPV VACCINES  Aged Out    Physical Exam: Vitals:   06/02/22 1256  BP: 126/68  Pulse: 77  Resp: 16  Temp: 97.8 F (36.6 C)  TempSrc: Temporal  SpO2: 97%  Weight: 212 lb (96.2 kg)  Height: 5\' 11"  (1.803 m)   Body mass index is 29.57 kg/m. Physical Exam Vitals reviewed.  Constitutional:      Appearance: Normal appearance.  HENT:     Head: Normocephalic.     Nose: Nose normal.     Mouth/Throat:     Mouth: Mucous membranes are moist.     Pharynx: Oropharynx is clear.  Eyes:     Pupils: Pupils are equal, round, and reactive to light.  Cardiovascular:     Rate and Rhythm: Normal rate and regular rhythm.     Pulses: Normal pulses.     Heart sounds: No murmur heard. Pulmonary:     Effort: Pulmonary effort is normal. No respiratory distress.     Breath sounds: Normal breath sounds. No rales.  Abdominal:     General: Abdomen is flat. Bowel sounds are normal.     Palpations: Abdomen is soft.  Musculoskeletal:        General: No swelling.     Cervical back: Neck supple.     Comments: Foot Exam with Mild Sensory loss Good Pulses No Lesions  Skin:    General: Skin is warm.  Neurological:     General: No focal deficit present.     Mental Status: He is alert and oriented to person, place, and time.  Psychiatric:        Mood and  Affect: Mood normal.        Thought Content: Thought content normal.     Labs reviewed: Basic Metabolic Panel: Recent Labs    12/13/21 0126 12/13/21 0225 01/07/22 0000 04/27/22 0708  NA 139 140 140 141  K 3.8 3.8 4.3 4.6  CL 103 103 107 106  CO2 23  --  26* 29  GLUCOSE 156* 145*  --  128*  BUN 25* 27* 24* 25  CREATININE 0.78 0.70  --  0.82  CALCIUM 9.7  --  9.4 9.4  TSH  --   --   --  1.26   Liver Function Tests: Recent Labs    12/13/21 0126 04/27/22 0708  AST 24 15  ALT 5 5*  ALKPHOS 92  --   BILITOT 0.9 0.6  PROT 6.4* 6.8  ALBUMIN 3.8  --    No results for input(s): "LIPASE", "AMYLASE" in the last 8760 hours. No results for input(s): "AMMONIA" in the last 8760 hours. CBC: Recent Labs    12/13/21 0126 12/13/21 0225 01/07/22 0000 04/27/22 0708  WBC 3.5*  --  5.0 3.8  NEUTROABS 1.7  --  3,310.00 2,307  HGB 14.3 14.3 14.7 14.6  HCT 42.2 42.0 43 43.4  MCV 92.3  --   --  90.4  PLT 202  --  226 251   Lipid Panel: Recent Labs    01/21/22 0749 04/27/22 0708  CHOL 120 113  HDL 43 39*  LDLCALC 63 60  TRIG 67 61  CHOLHDL 2.8 2.9   Lab Results  Component Value Date   HGBA1C 7.2 (H) 04/27/2022    Procedures since last visit: No results found.  Assessment/Plan 1. Type 2 diabetes mellitus with diabetic neuropathy, with long-term current use of insulin (HCC) A1C Slightly Elevated He is on Insulin  2. Mixed hyperlipidemia Statin  3. 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 Bilateral hearing loss, unspecified hearing loss type [facility]  5. Chronic left shoulder pain Plan for surgery  6. Nocturnal leg cramps Gabapentin  7. BPPV (benign paroxysmal positional vertigo), bilateral Takes Meclizine pRn  8. Lung nodule seen on imaging study Follow up with Dr Delton Coombes    Labs/tests ordered:   Next appt:  09/02/2022

## 2022-06-09 NOTE — Patient Instructions (Addendum)
SURGICAL WAITING ROOM VISITATION Patients having surgery or a procedure may have no more than 2 support people in the waiting area - these visitors may rotate in the visitor waiting room.   Due to an increase in RSV and influenza rates and associated hospitalizations, children ages 30 and under may not visit patients in [facility] hospitals. If the patient needs to stay at the hospital during part of their recovery, the visitor guidelines for inpatient rooms apply.  PRE-OP VISITATION  Pre-op nurse will coordinate an appropriate time for 1 support person to accompany the patient in pre-op.  This support person may not rotate.  This visitor will be contacted when the time is appropriate for the visitor to come back in the pre-op area.  Please refer to the [facility] website for the visitor guidelines for Inpatients (after your surgery is over and you are in a regular room).  You are not required to quarantine at this time prior to your surgery. However, you must do this: Hand Hygiene often Do NOT share personal items Notify your provider if you are in close contact with someone who has COVID or you develop fever 100.4 or greater, new onset of sneezing, cough, sore throat, shortness of breath or body aches.  If you test positive for Covid or have been in contact with anyone that has tested positive in the last 10 days please notify you surgeon.    Your procedure is scheduled on:  Thursday  Jun 17, 2022  Report to [facility] Main Entrance: [address] entrance where the [employer] is available.   Report to admitting at: 07:30  AM  +++++Call this number if you have any questions or problems the morning of surgery [phone]  Do not eat food after Midnight the night prior to your surgery/procedure.  After Midnight you may have the following liquids until  07:00  AM DAY OF SURGERY  Clear Liquid Diet Water Black Coffee (sugar ok, NO MILK/CREAM OR CREAMERS)  Tea (sugar ok, NO  MILK/CREAM OR CREAMERS) regular and decaf                             Plain Jell-O  with no fruit (NO RED)                                           Fruit ices (not with fruit pulp, NO RED)                                     Popsicles (NO RED)                                                                  Juice: apple, WHITE grape, WHITE cranberry Sports drinks like Gatorade or Powerade (NO RED)                    The day of surgery:  Drink ONE (1) Pre-Surgery G2 at  07:00   AM the morning of surgery. Drink in one sitting.  Do not sip.  This drink was given to you during your hospital pre-op appointment visit. Nothing else to drink after completing the Pre-Surgery  G2 : No candy, chewing gum or throat lozenges.    FOLLOW ANY ADDITIONAL PRE OP INSTRUCTIONS YOU RECEIVED FROM YOUR SURGEON'S OFFICE!!!   Oral Hygiene is also important to reduce your risk of infection.        Remember - BRUSH YOUR TEETH THE MORNING OF SURGERY WITH YOUR REGULAR TOOTHPASTE  Do NOT smoke after Midnight the night before surgery.  Diabetic medications/ instructions: Insulin Basaglar Kwikpen 35 units q am Day before surgery:  take 100% of dose    DAY OF SURGERY: Take 50 % dose or 17 units    Take ONLY these medicines the morning of surgery with A SIP OF WATER: Carbidopa-levodopa (Sinemet), gabapentin.  You may take Tylenol and use your Flonase nasal spray if needed.                      You may not have any metal on your body including  jewelry, and body piercing  Do not wear  lotions, powders,  cologne, or deodorant  Men may shave face and neck.  Contacts, Hearing Aids, dentures or bridgework may not be worn into surgery. DENTURES WILL BE REMOVED PRIOR TO SURGERY PLEASE DO NOT APPLY "Poly grip" OR ADHESIVES!!!  Patients discharged on the day of surgery will not be allowed to drive home.  Someone NEEDS to stay with you for the first 24 hours after anesthesia.  Do not bring your home medications to the  hospital. The Pharmacy will dispense medications listed on your medication list to you during your admission in the Hospital.  Special Instructions: Bring a copy of your healthcare power of attorney and living will documents the day of surgery, if you wish to have them scanned into your [place] Medical Records- EPIC  Please read over the following fact sheets you were given: IF YOU HAVE QUESTIONS ABOUT YOUR PRE-OP INSTRUCTIONS, PLEASE CALL [phone].   +++++++ PLEASE FOLLOW THE ATTACHED INFORMATION REGARDING SHOWERING / BATHING SCHEDULE  PRIOR TO YOUR SURGERY. Start this schedule on :  Sunday Jun 13, 2022   ON THE DAY OF SURGERY : Do not apply any lotions/deodorants the morning of surgery.  Please wear clean clothes to the hospital/surgery center.    FAILURE TO FOLLOW THESE INSTRUCTIONS MAY RESULT IN THE CANCELLATION OF YOUR SURGERY  PATIENT SIGNATURE_________________________________  NURSE SIGNATURE__________________________________  ________________________________________________________________________         Stephen Burns    An incentive spirometer is a tool that can help keep your lungs clear and active. This tool measures how well you are filling your lungs with each breath. Taking long deep breaths may help reverse or decrease the chance of developing breathing (pulmonary) problems (especially infection) following: A long period of time when you are unable to move or be active. BEFORE THE PROCEDURE  If the spirometer includes an indicator to show your best effort, your nurse or respiratory therapist will set it to a desired goal. If possible, sit up straight or lean slightly forward. Try not to slouch. Hold the incentive spirometer in an upright position. INSTRUCTIONS FOR USE  Sit on the edge of your bed if possible, or sit up as far as you can in bed or on a chair. Hold the incentive spirometer in an upright position. Breathe out normally. Place the  mouthpiece in your mouth and seal your lips  tightly around it. Breathe in slowly and as deeply as possible, raising the piston or the ball toward the top of the column. Hold your breath for 3-5 seconds or for as long as possible. Allow the piston or ball to fall to the bottom of the column. Remove the mouthpiece from your mouth and breathe out normally. Rest for a few seconds and repeat Steps 1 through 7 at least 10 times every 1-2 hours when you are awake. Take your time and take a few normal breaths between deep breaths. The spirometer may include an indicator to show your best effort. Use the indicator as a goal to work toward during each repetition. After each set of 10 deep breaths, practice coughing to be sure your lungs are clear. If you have an incision (the cut made at the time of surgery), support your incision when coughing by placing a pillow or rolled up towels firmly against it. Once you are able to get out of bed, walk around indoors and cough well. You may stop using the incentive spirometer when instructed by your caregiver.  RISKS AND COMPLICATIONS Take your time so you do not get dizzy or light-headed. If you are in pain, you may need to take or ask for pain medication before doing incentive spirometry. It is harder to take a deep breath if you are having pain. AFTER USE Rest and breathe slowly and easily. It can be helpful to keep track of a log of your progress. Your caregiver can provide you with a simple table to help with this. If you are using the spirometer at home, follow these instructions: SEEK MEDICAL CARE IF:  You are having difficultly using the spirometer. You have trouble using the spirometer as often as instructed. Your pain medication is not giving enough relief while using the spirometer. You develop fever of 100.5 F (38.1 C) or higher.                                                                                                    SEEK IMMEDIATE MEDICAL  CARE IF:  You cough up bloody sputum that had not been present before. You develop fever of 102 F (38.9 C) or greater. You develop worsening pain at or near the incision site. MAKE SURE YOU:  Understand these instructions. Will watch your condition. Will get help right away if you are not doing well or get worse. Document Released: 06/07/2006 Document Revised: 04/19/2011 Document Reviewed: 08/08/2006 [facility] Patient Information 2014 [place], [place].

## 2022-06-09 NOTE — Progress Notes (Signed)
COVID Vaccine received:  []  No [x]  Yes Date of any COVID positive Test in last 90 days:  PCP - Einar Crow, MD  at [facility] Cardiologist -  Pulmonology- Levy Pupa, MD  Chest x-ray - 01-22-2021  1v  Epic EKG -12-14-2021  Epic   Stress Test -  ECHO -  Cardiac Cath -   PCR screen: [x]  Ordered & Completed           []   No Order but Needs PROFEND           []   N/A for this surgery  Surgery Plan:  [x]  Ambulatory                            []  Outpatient in bed                            []  Admit  Anesthesia:    [x]  General  []  Spinal                           []   Choice []   MAC  Pacemaker / ICD device [x]  No []  Yes   Spinal Cord Stimulator:[x]  No []  Yes       History of Sleep Apnea? [x]  No []  Yes   CPAP used?- [x]  No []  Yes    Does the patient monitor blood sugar?          []  No []  Yes  []  N/A  Patient has: []  NO Hx DM   []  Pre-DM                 []  DM1  [x]   DM2 Does patient have a [employer] or Dexacom? []  No []  Yes   Fasting Blood Sugar Ranges-  Checks Blood Sugar _____ times a day  Diabetic medications/ instructions: Insulin Basaglar Kwikpen 35 units q am Day before surgery:  take 100% of dose    DOS: Takes 50 % dose or17 units  Blood Thinner / Instructions:none Aspirin Instructions:none  ERAS Protocol Ordered: []  No  [x]  Yes PRE-SURGERY []  ENSURE  [x]  G2  Patient is to be NPO after: 07:00 am  Comments: Patient lives at [facility]  Activity level: Patient is able / unable to climb a flight of stairs without difficulty; []  No CP  []  No SOB, but would have ___   Patient can / can not perform ADLs without assistance.   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  Patient verbalized understanding and agreement to the Pre-Surgical Instructions that were given to them at this PAT appointment.  Patient was also educated of the need to review these PAT instructions again prior to his surgery.I reviewed the appropriate phone numbers to call if they have any and questions or concerns.

## 2022-06-10 ENCOUNTER — Encounter (HOSPITAL_COMMUNITY)
Admission: RE | Admit: 2022-06-10 | Discharge: 2022-06-10 | Disposition: A | Discharge status: Home / Self Care | Payer: Medicare HMO | Source: Ambulatory Visit | Attending: Orthopedic Surgery | Admitting: Orthopedic Surgery

## 2022-06-10 ENCOUNTER — Encounter (HOSPITAL_COMMUNITY): Payer: Self-pay

## 2022-06-10 ENCOUNTER — Other Ambulatory Visit: Payer: Self-pay

## 2022-06-10 VITALS — BP 123/60 | HR 80 | Temp 98.0°F | Resp 18 | Ht 71.0 in | Wt 206.0 lb

## 2022-06-10 DIAGNOSIS — Z794 Long term (current) use of insulin: Secondary | ICD-10-CM | POA: Diagnosis not present

## 2022-06-10 DIAGNOSIS — Z01818 Encounter for other preprocedural examination: Secondary | ICD-10-CM | POA: Diagnosis not present

## 2022-06-10 DIAGNOSIS — E119 Type 2 diabetes mellitus without complications: Secondary | ICD-10-CM | POA: Diagnosis not present

## 2022-06-10 HISTORY — DX: Depression, unspecified: F32.A

## 2022-06-10 LAB — COMPREHENSIVE METABOLIC PANEL
ALT: 7 U/L (ref 0–44)
AST: 21 U/L (ref 15–41)
Albumin: 4.1 g/dL (ref 3.5–5.0)
Alkaline Phosphatase: 83 U/L (ref 38–126)
Anion gap: 8 (ref 5–15)
BUN: 25 mg/dL — ABNORMAL HIGH (ref 8–23)
CO2: 25 mmol/L (ref 22–32)
Calcium: 9 mg/dL (ref 8.9–10.3)
Chloride: 104 mmol/L (ref 98–111)
Creatinine, Ser: 0.94 mg/dL (ref 0.61–1.24)
GFR, Estimated: >60 mL/min (ref >60)
Glucose, Bld: 228 mg/dL — ABNORMAL HIGH (ref 70–99)
Potassium: 4.1 mmol/L (ref 3.5–5.1)
Sodium: 137 mmol/L (ref 135–145)
Total Bilirubin: 0.6 mg/dL (ref 0.3–1.2)
Total Protein: 7.2 g/dL (ref 6.5–8.1)

## 2022-06-10 LAB — CBC
HCT: 43.7 % (ref 39.0–52.0)
Hemoglobin: 14.2 g/dL (ref 13.0–17.0)
MCH: 30.5 pg (ref 26.0–34.0)
MCHC: 32.5 g/dL (ref 30.0–36.0)
MCV: 93.8 fL (ref 80.0–100.0)
Platelets: 237 10*3/uL (ref 150–400)
RBC: 4.66 MIL/uL (ref 4.22–5.81)
RDW: 12.9 % (ref 11.5–15.5)
WBC: 6.6 10*3/uL (ref 4.0–10.5)
nRBC: 0 % (ref 0.0–0.2)

## 2022-06-10 LAB — SURGICAL PCR SCREEN
MRSA, PCR: NEGATIVE
Staphylococcus aureus: NEGATIVE

## 2022-06-10 LAB — HEMOGLOBIN A1C
Hgb A1c MFr Bld: 6.6 % — ABNORMAL HIGH (ref 4.8–5.6)
Mean Plasma Glucose: 142.72 mg/dL

## 2022-06-10 LAB — GLUCOSE, CAPILLARY: Glucose-Capillary: 220 mg/dL — ABNORMAL HIGH (ref 70–99)

## 2022-06-17 ENCOUNTER — Ambulatory Visit (HOSPITAL_COMMUNITY)
Admission: RE | Admit: 2022-06-17 | Discharge: 2022-06-17 | Disposition: A | Discharge status: Home / Self Care | Payer: Medicare HMO | Attending: Orthopedic Surgery | Admitting: Orthopedic Surgery

## 2022-06-17 ENCOUNTER — Ambulatory Visit (HOSPITAL_BASED_OUTPATIENT_CLINIC_OR_DEPARTMENT_OTHER): Payer: Medicare HMO | Admitting: Anesthesiology

## 2022-06-17 ENCOUNTER — Encounter (HOSPITAL_COMMUNITY)
Admission: RE | Disposition: A | Discharge status: Home / Self Care | Payer: Self-pay | Source: Home / Self Care | Attending: Orthopedic Surgery

## 2022-06-17 ENCOUNTER — Encounter (HOSPITAL_COMMUNITY): Payer: Self-pay | Admitting: Orthopedic Surgery

## 2022-06-17 ENCOUNTER — Ambulatory Visit (HOSPITAL_COMMUNITY): Payer: Medicare HMO | Admitting: Anesthesiology

## 2022-06-17 ENCOUNTER — Other Ambulatory Visit: Payer: Self-pay

## 2022-06-17 DIAGNOSIS — M25812 Other specified joint disorders, left shoulder: Secondary | ICD-10-CM | POA: Insufficient documentation

## 2022-06-17 DIAGNOSIS — E119 Type 2 diabetes mellitus without complications: Secondary | ICD-10-CM

## 2022-06-17 DIAGNOSIS — Z794 Long term (current) use of insulin: Secondary | ICD-10-CM

## 2022-06-17 DIAGNOSIS — M19012 Primary osteoarthritis, left shoulder: Secondary | ICD-10-CM | POA: Insufficient documentation

## 2022-06-17 DIAGNOSIS — M6289 Other specified disorders of muscle: Secondary | ICD-10-CM

## 2022-06-17 DIAGNOSIS — G8918 Other acute postprocedural pain: Secondary | ICD-10-CM | POA: Diagnosis not present

## 2022-06-17 HISTORY — PX: REVERSE SHOULDER ARTHROPLASTY: SHX5054

## 2022-06-17 LAB — GLUCOSE, CAPILLARY
Glucose-Capillary: 129 mg/dL — ABNORMAL HIGH (ref 70–99)
Glucose-Capillary: 118 mg/dL — ABNORMAL HIGH (ref 70–99)

## 2022-06-17 SURGERY — ARTHROPLASTY, SHOULDER, TOTAL, REVERSE
Anesthesia: Regional | Site: Shoulder | Laterality: Left

## 2022-06-17 MED ORDER — LIDOCAINE HCL (CARDIAC) PF 100 MG/5ML IV SOSY
PREFILLED_SYRINGE | INTRAVENOUS | Status: DC | PRN
  Administered 2022-06-17: 50 mg via INTRAVENOUS

## 2022-06-17 MED ORDER — SUGAMMADEX SODIUM 200 MG/2ML IV SOLN
INTRAVENOUS | Status: DC | PRN
  Administered 2022-06-17: 200 mg via INTRAVENOUS

## 2022-06-17 MED ORDER — ORAL CARE MOUTH RINSE: 15.0000 mL | Freq: Once | OROMUCOSAL | Status: AC

## 2022-06-17 MED ORDER — FENTANYL CITRATE (PF) 100 MCG/2ML IJ SOLN
INTRAMUSCULAR | Status: DC | PRN
  Administered 2022-06-17: 50 ug via INTRAVENOUS

## 2022-06-17 MED ORDER — ONDANSETRON HCL 4 MG/2ML IJ SOLN
INTRAMUSCULAR | Status: AC
  Filled 2022-06-17: qty 2

## 2022-06-17 MED ORDER — 0.9 % SODIUM CHLORIDE (POUR BTL) OPTIME
TOPICAL | Status: DC | PRN
  Administered 2022-06-17: 1000 mL

## 2022-06-17 MED ORDER — VANCOMYCIN HCL 1000 MG IV SOLR
INTRAVENOUS | Status: DC | PRN
  Administered 2022-06-17: 1000 mg via TOPICAL

## 2022-06-17 MED ORDER — BUPIVACAINE LIPOSOME 1.3 % IJ SUSP
INTRAMUSCULAR | Status: DC | PRN
  Administered 2022-06-17: 10 mL via PERINEURAL

## 2022-06-17 MED ORDER — TRAMADOL HCL 50 MG PO TABS: 50.0000 mg | ORAL_TABLET | Freq: Four times a day (QID) | ORAL | 0 refills | Status: DC | PRN

## 2022-06-17 MED ORDER — ONDANSETRON HCL 4 MG/2ML IJ SOLN: 4.0000 mg | Freq: Four times a day (QID) | INTRAMUSCULAR | Status: DC | PRN

## 2022-06-17 MED ORDER — ONDANSETRON HCL 4 MG/2ML IJ SOLN
INTRAMUSCULAR | Status: DC | PRN
  Administered 2022-06-17: 4 mg via INTRAVENOUS

## 2022-06-17 MED ORDER — CHLORHEXIDINE GLUCONATE 0.12 % MT SOLN
15.0000 mL | Freq: Once | OROMUCOSAL | Status: AC
  Administered 2022-06-17: 15 mL via OROMUCOSAL

## 2022-06-17 MED ORDER — PHENYLEPHRINE HCL (PRESSORS) 10 MG/ML IV SOLN
INTRAVENOUS | Status: AC
  Filled 2022-06-17: qty 1

## 2022-06-17 MED ORDER — PHENYLEPHRINE HCL-NACL 20-0.9 MG/250ML-% IV SOLN: INTRAVENOUS | Status: DC | PRN

## 2022-06-17 MED ORDER — ROCURONIUM BROMIDE 100 MG/10ML IV SOLN
INTRAVENOUS | Status: DC | PRN
  Administered 2022-06-17: 40 mg via INTRAVENOUS

## 2022-06-17 MED ORDER — ONDANSETRON HCL 4 MG PO TABS: 4.0000 mg | ORAL_TABLET | Freq: Four times a day (QID) | ORAL | Status: DC | PRN

## 2022-06-17 MED ORDER — LIDOCAINE HCL (PF) 2 % IJ SOLN
INTRAMUSCULAR | Status: AC
  Filled 2022-06-17: qty 5

## 2022-06-17 MED ORDER — AMISULPRIDE (ANTIEMETIC) 5 MG/2ML IV SOLN: 10.0000 mg | Freq: Once | INTRAVENOUS | Status: DC | PRN

## 2022-06-17 MED ORDER — RIVAROXABAN 10 MG PO TABS: 10.0000 mg | ORAL_TABLET | Freq: Every day | ORAL | 0 refills | Status: DC

## 2022-06-17 MED ORDER — BUPIVACAINE HCL (PF) 0.5 % IJ SOLN
INTRAMUSCULAR | Status: DC | PRN
  Administered 2022-06-17: 15 mL via PERINEURAL

## 2022-06-17 MED ORDER — TRANEXAMIC ACID 1000 MG/10ML IV SOLN: 1000.0000 mg | INTRAVENOUS | Status: DC

## 2022-06-17 MED ORDER — PROPOFOL 10 MG/ML IV BOLUS
INTRAVENOUS | Status: DC | PRN
  Administered 2022-06-17: 100 mg via INTRAVENOUS

## 2022-06-17 MED ORDER — ROCURONIUM BROMIDE 10 MG/ML (PF) SYRINGE
PREFILLED_SYRINGE | INTRAVENOUS | Status: AC
  Filled 2022-06-17: qty 10

## 2022-06-17 MED ORDER — LACTATED RINGERS IV SOLN: INTRAVENOUS | Status: DC

## 2022-06-17 MED ORDER — CEFAZOLIN SODIUM-DEXTROSE 2-4 GM/100ML-% IV SOLN
2.0000 g | INTRAVENOUS | Status: AC
  Administered 2022-06-17: 2 g via INTRAVENOUS
  Filled 2022-06-17: qty 100

## 2022-06-17 MED ORDER — FENTANYL CITRATE PF 50 MCG/ML IJ SOSY
50.0000 ug | PREFILLED_SYRINGE | INTRAMUSCULAR | Status: DC
  Administered 2022-06-17: 50 ug via INTRAVENOUS
  Filled 2022-06-17: qty 2

## 2022-06-17 MED ORDER — METOCLOPRAMIDE HCL 5 MG/ML IJ SOLN: 5.0000 mg | Freq: Three times a day (TID) | INTRAMUSCULAR | Status: DC | PRN

## 2022-06-17 MED ORDER — OXYCODONE-ACETAMINOPHEN 5-325 MG PO TABS: 1.0000 | ORAL_TABLET | ORAL | 0 refills | Status: DC | PRN

## 2022-06-17 MED ORDER — PHENYLEPHRINE HCL-NACL 20-0.9 MG/250ML-% IV SOLN
INTRAVENOUS | Status: DC | PRN
  Administered 2022-06-17: 50 ug/min via INTRAVENOUS

## 2022-06-17 MED ORDER — DEXAMETHASONE SODIUM PHOSPHATE 10 MG/ML IJ SOLN
INTRAMUSCULAR | Status: DC | PRN
  Administered 2022-06-17: 4 mg via INTRAVENOUS

## 2022-06-17 MED ORDER — FENTANYL CITRATE (PF) 100 MCG/2ML IJ SOLN
INTRAMUSCULAR | Status: AC
  Filled 2022-06-17: qty 2

## 2022-06-17 MED ORDER — FENTANYL CITRATE PF 50 MCG/ML IJ SOSY: 25.0000 ug | PREFILLED_SYRINGE | INTRAMUSCULAR | Status: DC | PRN

## 2022-06-17 MED ORDER — TRANEXAMIC ACID-NACL 1000-0.7 MG/100ML-% IV SOLN
1000.0000 mg | INTRAVENOUS | Status: AC
  Administered 2022-06-17: 1000 mg via INTRAVENOUS
  Filled 2022-06-17: qty 100

## 2022-06-17 MED ORDER — PROPOFOL 10 MG/ML IV BOLUS
INTRAVENOUS | Status: AC
  Filled 2022-06-17: qty 20

## 2022-06-17 MED ORDER — ONDANSETRON HCL 4 MG PO TABS: 4.0000 mg | ORAL_TABLET | Freq: Three times a day (TID) | ORAL | 0 refills | Status: DC | PRN

## 2022-06-17 MED ORDER — ONDANSETRON HCL 4 MG/2ML IJ SOLN: 4.0000 mg | Freq: Once | INTRAMUSCULAR | Status: DC | PRN

## 2022-06-17 MED ORDER — ACETAMINOPHEN 10 MG/ML IV SOLN: 1000.0000 mg | Freq: Once | INTRAVENOUS | Status: DC | PRN

## 2022-06-17 MED ORDER — VANCOMYCIN HCL 1000 MG IV SOLR
INTRAVENOUS | Status: AC
  Filled 2022-06-17: qty 20

## 2022-06-17 MED ORDER — METOCLOPRAMIDE HCL 5 MG PO TABS: 5.0000 mg | ORAL_TABLET | Freq: Three times a day (TID) | ORAL | Status: DC | PRN

## 2022-06-17 SURGICAL SUPPLY — 69 items
ADH SKN CLS APL DERMABOND .7 (GAUZE/BANDAGES/DRESSINGS) ×1
AID PSTN UNV HD RSTRNT DISP (MISCELLANEOUS) ×1
BAG COUNTER SPONGE SURGICOUNT (BAG) IMPLANT
BAG SPEC THK2 15X12 ZIP CLS (MISCELLANEOUS) ×1
BAG SPNG CNTER NS LX DISP (BAG) ×1
BAG ZIPLOCK 12X15 (MISCELLANEOUS) ×1 IMPLANT
BIT DRILL AR 3 (BIT) ×1
BIT DRILL AR 3 NS (BIT) IMPLANT
BLADE SAW SGTL 83.5X18.5 (BLADE) ×1 IMPLANT
BNDG CMPR 5X4 CHSV STRCH STRL (GAUZE/BANDAGES/DRESSINGS) ×1
BNDG COHESIVE 4X5 TAN STRL LF (GAUZE/BANDAGES/DRESSINGS) ×1 IMPLANT
BSPLAT GLND +2X24 MDLR (Joint) ×1 IMPLANT
COOLER ICEMAN CLASSIC (MISCELLANEOUS) ×1 IMPLANT
COVER BACK TABLE 60X90IN (DRAPES) ×1 IMPLANT
COVER SURGICAL LIGHT HANDLE (MISCELLANEOUS) ×1 IMPLANT
CUP SUT UNIV REVERS 39 NEU (Shoulder) IMPLANT
DERMABOND ADVANCED .7 DNX12 (GAUZE/BANDAGES/DRESSINGS) ×1 IMPLANT
DRAPE ORTHO SPLIT 77X108 STRL (DRAPES) ×2
DRAPE SHEET LG 3/4 BI-LAMINATE (DRAPES) ×1 IMPLANT
DRAPE SURG 17X11 SM STRL (DRAPES) ×1 IMPLANT
DRAPE SURG ORHT 6 SPLT 77X108 (DRAPES) ×2 IMPLANT
DRAPE TOP 10253 STERILE (DRAPES) ×1 IMPLANT
DRAPE U-SHAPE 47X51 STRL (DRAPES) ×1 IMPLANT
DRESSING AQUACEL AG SP 3.5X6 (GAUZE/BANDAGES/DRESSINGS) ×1 IMPLANT
DRSG AQUACEL AG ADV 3.5X 6 (GAUZE/BANDAGES/DRESSINGS) IMPLANT
DRSG AQUACEL AG ADV 3.5X10 (GAUZE/BANDAGES/DRESSINGS) IMPLANT
DRSG AQUACEL AG SP 3.5X6 (GAUZE/BANDAGES/DRESSINGS) ×1
DURAPREP 26ML APPLICATOR (WOUND CARE) ×1 IMPLANT
ELECT BLADE TIP CTD 4 INCH (ELECTRODE) ×1 IMPLANT
ELECT PENCIL ROCKER SW 15FT (MISCELLANEOUS) ×1 IMPLANT
ELECT REM PT RETURN 15FT ADLT (MISCELLANEOUS) ×1 IMPLANT
FACESHIELD WRAPAROUND (MASK) ×5 IMPLANT
FACESHIELD WRAPAROUND OR TEAM (MASK) ×5 IMPLANT
GLENOID UNI REV MOD 24 +2 LAT (Joint) IMPLANT
GLENOSPHERE 39+4 LAT/24 UNI RV (Joint) IMPLANT
GLOVE BIO SURGEON STRL SZ7.5 (GLOVE) ×1 IMPLANT
GLOVE BIO SURGEON STRL SZ8 (GLOVE) ×1 IMPLANT
GLOVE SS BIOGEL STRL SZ 7 (GLOVE) ×1 IMPLANT
GLOVE SS BIOGEL STRL SZ 7.5 (GLOVE) ×1 IMPLANT
GOWN STRL SURGICAL XL XLNG (GOWN DISPOSABLE) ×2 IMPLANT
INSERT HUMERAL 39/+6 (Insert) IMPLANT
KIT BASIN OR (CUSTOM PROCEDURE TRAY) ×1 IMPLANT
KIT TURNOVER KIT A (KITS) IMPLANT
MANIFOLD NEPTUNE II (INSTRUMENTS) ×1 IMPLANT
NDL TAPERED W/ NITINOL LOOP (MISCELLANEOUS) ×1 IMPLANT
NEEDLE TAPERED W/ NITINOL LOOP (MISCELLANEOUS) ×1 IMPLANT
NS IRRIG 1000ML POUR BTL (IV SOLUTION) ×1 IMPLANT
PACK SHOULDER (CUSTOM PROCEDURE TRAY) ×1 IMPLANT
PAD ARMBOARD 7.5X6 YLW CONV (MISCELLANEOUS) ×1 IMPLANT
PAD COLD SHLDR WRAP-ON (PAD) ×1 IMPLANT
PIN SET MODULAR GLENOID SYSTEM (PIN) IMPLANT
RESTRAINT HEAD UNIVERSAL NS (MISCELLANEOUS) ×1 IMPLANT
SCREW CENTRAL MOD 30MM (Screw) IMPLANT
SCREW PERI LOCK 5.5X24 (Screw) IMPLANT
SCREW PERI LOCK 5.5X36 (Screw) IMPLANT
SCREW PERIPHERAL 5.5X20 LOCK (Screw) IMPLANT
SLING ARM FOAM STRAP LRG (SOFTGOODS) IMPLANT
SLING ARM FOAM STRAP MED (SOFTGOODS) IMPLANT
STEM HUMERAL UNI REVERSE SZ10 (Stem) IMPLANT
SUT MNCRL AB 3-0 PS2 18 (SUTURE) ×1 IMPLANT
SUT MON AB 2-0 CT1 36 (SUTURE) ×1 IMPLANT
SUT VIC AB 1 CT1 36 (SUTURE) ×1 IMPLANT
SUTURE TAPE 1.3 40 TPR END (SUTURE) ×2 IMPLANT
SUTURETAPE 1.3 40 TPR END (SUTURE) ×2
TOWEL OR 17X26 10 PK STRL BLUE (TOWEL DISPOSABLE) ×1 IMPLANT
TOWEL OR NON WOVEN STRL DISP B (DISPOSABLE) ×1 IMPLANT
TUBE SUCTION HIGH CAP CLEAR NV (SUCTIONS) ×1 IMPLANT
TUBING CONNECTING 10 (TUBING) IMPLANT
WATER STERILE IRR 1000ML POUR (IV SOLUTION) ×2 IMPLANT

## 2022-06-17 NOTE — Evaluation (Signed)
Occupational Therapy Evaluation Patient Details Name: Stephen Burns MRN: [id number] DOB: 1940/12/30 Today's Date: 06/17/2022   History of Present Illness Mr. Riss is a 82 yr old male who is s/p a L shoulder reverse arthroplasty on 06-17-22, due to shoulder OA and rotator cuff dysfunction.   Clinical Impression   Patient is s/p shoulder replacement without functional use of left non-dominant upper extremity, secondary to effects of surgery and interscalene block and shoulder precautions. Therapist provided education and instruction to patient, his spouse, and pt's son with regards to UE exercises/ROM, post-op precautions, UE positioning, donning upper extremity clothing, recommendations for bathing while maintaining shoulder precautions, use of ice for pain and edema management, and correctly donning/doffing sling. Patient's son presented with good understanding & teach back abilities; the pt & his spouse presented with fair+ understanding & teach back abilities.  Patient needed assistance to donn shirt, underwear, pants, and shoes, with instruction provided on compensatory strategies to perform ADLs. Patient to follow up with MD for further therapy needs.        Recommendations for follow up therapy are one component of a multi-disciplinary discharge planning process, led by the attending physician.  Recommendations may be updated based on patient status, additional functional criteria and insurance authorization.   Assistance Recommended at Discharge Intermittent Supervision/Assistance  Patient can return home with the following Assistance with cooking/housework;Assist for transportation;A little help with bathing/dressing/bathroom;Direct supervision/assist for medications management    Functional Status Assessment  Patient has had a recent decline in their functional status and demonstrates the ability to make significant improvements in function in a reasonable and predictable amount of time.   Equipment Recommendations  None recommended by OT       Precautions / Restrictions Precautions Precautions: Shoulder Type of Shoulder Precautions: If sitting in controlled environment, ok to come out of sling to give neck a break. Please sleep in it to protect until follow up in office.     OK to use operative arm for feeding, hygiene and ADLs.   Ok to instruct Pendulums and lap slides as exercises. Ok to use operative arm within the following parameters for ADL purposes     New ROM (7/82)   Ok for PROM, AAROM, AROM within pain tolerance and within the following ROM   ER 20   ABD 45   FE 60 Precaution Booklet Issued: Yes (comment) Required Braces or Orthoses: Sling Restrictions Weight Bearing Restrictions: Yes LUE Weight Bearing: Non weight bearing Other Position/Activity Restrictions: no exercises for internal rotation, sling on at all times except exercises and ADLs      Mobility             Transfers     Transfers: Sit to/from Stand Sit to Stand: Supervision             ADL either performed or assessed with clinical judgement       Pertinent Vitals/Pain Pain Assessment Pain Assessment: No/denies pain     Hand Dominance Right      Communication Communication Communication: No difficulties   Cognition Arousal/Alertness: Awake/alert Behavior During Therapy: WFL for tasks assessed/performed        General Comments: Oriented x4, able to follow 1-2 step commands consistently        Exercises     Shoulder Instructions Shoulder Instructions Donning/doffing shirt without moving shoulder: Moderate assistance Method for sponge bathing under operated UE: Caregiver independent with task (Pt's son) Donning/doffing sling/immobilizer: Moderate assistance Correct positioning of sling/immobilizer: Moderate assistance Pendulum  exercises (written home exercise program): Caregiver independent with task (Pt's son) ROM for elbow, wrist and digits of operated UE:  Caregiver independent with task (Pt's son) Sling wearing schedule (on at all times/off for ADL's): Caregiver independent with task (Pt's son) Proper positioning of operated UE when showering: Caregiver independent with task (Pt's son) Positioning of UE while sleeping: Caregiver independent with task (Pt's son)    Home Living Family/patient expects to be discharged to:: Other (Comment) ([facility]) Living Arrangements: Spouse/significant other     Home Access: Level entry     Home Layout: One level     Bathroom Shower/Tub: Walk-in shower         Home Equipment: Control and instrumentation engineer (2 wheels)   Additional Comments: he also has a walking stick      Prior Functioning/Environment Prior Level of Function : Independent/Modified Independent;Driving             Mobility Comments: He intermittently uses a walking stick. ADLs Comments: He was independent with ADLs and driving.        OT Problem List: Impaired UE functional use;Decreased range of motion      OT Treatment/Interventions:   No further OT treatment needs identified    OT Goals(Current goals can be found in the care plan section)    OT Frequency:  N/A       AM-PAC OT "6 Clicks" Daily Activity     Outcome Measure Help from another person eating meals?: None Help from another person taking care of personal grooming?: A Little Help from another person toileting, which includes using toliet, bedpan, or urinal?: A Little Help from another person bathing (including washing, rinsing, drying)?: A Little Help from another person to put on and taking off regular upper body clothing?: A Lot Help from another person to put on and taking off regular lower body clothing?: A Little 6 Click Score: 18   End of Session Nurse Communication: Other (comment) (OT evaluation and shoulder education completed)  Activity Tolerance: Patient tolerated treatment well Patient left: in chair;with  call bell/phone within reach;with family/visitor present  OT Visit Diagnosis: Muscle weakness (generalized) (M62.81)                Time: 8295-6213 OT Time Calculation (min): 35 min Charges:  OT General Charges $OT Visit: 1 Visit OT Evaluation $OT Eval Low Complexity: 1 Low OT Treatments $Self Care/Home Management : 8-22 mins    Reuben Likes, OTR/L 06/17/2022, 1:51 PM

## 2022-06-17 NOTE — Care Plan (Signed)
Ortho Bundle Case Management Note  Patient Details  Name: MARUICE MITMAN MRN: [id number] Date of Birth: 01-Apr-1940                  L Rev TSA on 06-17-22  DCP: Home with wife  DME: No needs  PT: EO   DME Arranged:  N/A DME Agency:       Additional Comments: Please contact me with any questions of if this plan should need to change.   Ennis Forts, RN,CCM [facility]  [phone] 06/17/2022, 8:47 AM

## 2022-06-17 NOTE — Anesthesia Procedure Notes (Signed)
Procedure Name: Intubation Date/Time: 06/17/2022 9:49 AM  Performed by: Johnette Abraham, CRNAPre-anesthesia Checklist: Patient identified, Emergency Drugs available, Suction available and Patient being monitored Patient Re-evaluated:Patient Re-evaluated prior to induction Oxygen Delivery Method: Circle System Utilized Preoxygenation: Pre-oxygenation with 100% oxygen Induction Type: IV induction Ventilation: Mask ventilation without difficulty Laryngoscope Size: Mac and 4 Grade View: Grade II Tube type: Oral Tube size: 8.0 mm Number of attempts: 1 Airway Equipment and Method: Stylet and Oral airway Placement Confirmation: ETT inserted through vocal cords under direct vision, positive ETCO2 and breath sounds checked- equal and bilateral Secured at: 23 cm Tube secured with: Tape Dental Injury: Teeth and Oropharynx as per pre-operative assessment

## 2022-06-17 NOTE — Anesthesia Procedure Notes (Signed)
Anesthesia Regional Block: Interscalene brachial plexus block   Pre-Anesthetic Checklist: , timeout performed,  Correct Patient, Correct Site, Correct Laterality,  Correct Procedure, Correct Position, site marked,  Risks and benefits discussed,  Surgical consent,  Pre-op evaluation,  At surgeon's request and post-op pain management  Laterality: Left  Prep: chloraprep       Needles:  Injection technique: Single-shot  Needle Type: Echogenic Stimulator Needle     Needle Length: 9cm  Needle Gauge: 21     Additional Needles:   Procedures:,,,, ultrasound used (permanent image in chart),,    Narrative:  Start time: 06/17/2022 8:35 AM End time: 06/17/2022 8:45 AM Injection made incrementally with aspirations every 5 mL.  Performed by: Personally  Anesthesiologist: Leonides Grills, MD  Additional Notes: Functioning IV was confirmed and monitors were applied.  A timeout was performed. Sterile prep, hand hygiene and sterile gloves were used. A 90mm 21ga Arrow echogenic stimulator needle was used. Negative aspiration and negative test dose prior to incremental administration of local anesthetic. The patient tolerated the procedure well.  Ultrasound guidance: relevent anatomy identified, needle position confirmed, local anesthetic spread visualized around nerve(s), vascular puncture avoided.  Image printed for medical record.

## 2022-06-17 NOTE — Op Note (Signed)
06/17/2022  10:59 AM  PATIENT:   Stephen Burns  82 y.o. 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  ASSISTANTS: Ralene Bathe, PA-C  Ralene Bathe, PA-C was utilized as an Geophysicist/field seismologist throughout this case, essential for help with positioning the patient, positioning extremity, tissue manipulation, implantation of the prosthesis, suture management, wound closure, and intraoperative decision-making.  ANESTHESIA:   General endotracheal and interscalene block with Exparel  EBL: 150 cc  SPECIMEN: None  Drains: None   PATIENT DISPOSITION:  PACU - hemodynamically stable.    PLAN OF CARE: Discharge to home after PACU  Brief history:  Patient is an 82 year old male who has been followed for chronic and progressively increasing left shoulder pain related to severe osteoarthritis with rotator cuff dysfunction and degeneration.  Due to his increasing pain and functional imitations and failure to respond to prolonged attempts at conservative management, he is brought to the operating this time for planned left shoulder reverse arthroplasty.  Preoperatively, I counseled the patient regarding treatment options and risks versus benefits thereof.  Possible surgical complications were all reviewed including potential for bleeding, infection, neurovascular injury, persistent pain, loss of motion, anesthetic complication, failure of the implant, and possible need for additional surgery. They understand and accept and agrees with our planned procedure.   Procedure detail:  After undergoing routine preop evaluation the patient received prophylactic antibiotics and interscalene block with Exparel was  established in the holding area by the anesthesia department.  Subsequently placed spine on the operating table and underwent the smooth induction of a general endotracheal anesthesia.  Placed into the beachchair position and appropriately padded and protected.  Left shoulder girdle region was sterilely prepped and draped in standard fashion.  Timeout was called.  Deltopectoral approach was made the left shoulder through an approximate 10 cm incision.  Skin flaps elevated dissection carried deeply and the deltopectoral interval was then developed from proximal to distal with the vein taken laterally.  The conjoined tendon was mobilized and retracted medially and adhesions were divided beneath the deltoid.  There had been previous rupture long head biceps tendon.  The superior remnant of the rotator cuff was split from the apex of the bicipital groove to the base of the coracoid and the subscap was separated from the lesser tuberosity using electrocautery and tagged with a pair of grasping suture tape sutures.  Capsular attachments were then divided from the anterior and inferior margins of the humeral neck allowing deliver the humeral head through the wound.  An extra medullary guide was then used to outline the proposed humeral head resection which we performed with an oscillating saw at approximate 20 degrees of retroversion.  Marginal osteophytes were removed with rongeurs and a metal cap was placed over the cut proximal humeral surface and the glenoid was then exposed with appropriate retractors.  A circumferential labral resection was then performed.  Guidepin directed into the center of the glenoid and the glenoid was then reamed with the central followed by the peripheral reamer to a stable subchondral bony bed.  Preparation completed with a drill and tap for a 30 mm lag screw.  Our baseplate was then assembled and inserted with vancomycin powder applied to the threads of the lag screw and excellent fixation  was achieved.  All of the peripheral locking screws were then placed using standard technique with excellent fixation.  A 39/+4 glenosphere was then impacted onto the baseplate and the central locking screw was placed.  We then returned to the humeral metaphysis where the canal was opened and we ultimately broached up to a size 10 stem at 20 degrees of retroversion.  The metaphysis was then prepared using the reamer and a trial implant was placed which showed good motion stability and soft tissue balance.  The trial was then removed.  A final implant was assembled the canal was irrigated cleaned and dried and vancomycin powder was placed into the canal and a final implant was then seated with excellent fixation.  A series of trial reductions were then performed and ultimately felt that the +6 poly gave is the best motion stability and soft tissue balance.  The trial poly was removed.  The implant was cleaned and dried and the final poly was impacted.  Final reduction showed excellent motion stability and soft tissue balance.  We confirmed good elasticity of the subscapularis tendon was repaired back to the eyelets on the collar of the implant using the previously placed suture tape sutures.  The arm easily achieve 45 degrees of external rotation without excessive tension on the subscap repair.  Final irrigation was completed.  Hemostasis was obtained.  The balance of vancomycin powder was then sprayed liberally throughout the deep soft tissue planes.  Deltopectoral interval reapproximated with a series of figure-of-eight number Vicryl sutures.  2-0 Monocryl used to close the subcu layer and intracuticular 3-0 Monocryl used to close the skin followed by Dermabond and Aquacel dressing.  Left arm placed into a sling and the patient was awakened, extubated, and taken to the recovery room in stable condition.  Senaida Lange MD   Contact # [phone]

## 2022-06-17 NOTE — Anesthesia Preprocedure Evaluation (Addendum)
Anesthesia Evaluation  Patient identified by MRN, date of birth, ID band Patient awake    Reviewed: Allergy & Precautions, NPO status , Patient's Chart, lab work & pertinent test results  Airway Mallampati: III  TM Distance: >3 FB Neck ROM: Full    Dental no notable dental hx.    Pulmonary PE   Pulmonary exam normal        Cardiovascular negative cardio ROS Normal cardiovascular exam     Neuro/Psych  Headaches PSYCHIATRIC DISORDERS  Depression    Vertigo    GI/Hepatic negative GI ROS, Neg liver ROS,,,  Endo/Other  diabetes, Insulin Dependent    Renal/GU negative Renal ROS     Musculoskeletal  (+) Arthritis ,    Abdominal   Peds  Hematology negative hematology ROS (+)   Anesthesia Other Findings   Reproductive/Obstetrics                             Anesthesia Physical Anesthesia Plan  ASA: 2  Anesthesia Plan: General and Regional   Post-op Pain Management:    Induction: Intravenous  PONV Risk Score and Plan: 2 and Ondansetron, Dexamethasone and Treatment may vary due to age or medical condition  Airway Management Planned: Oral ETT  Additional Equipment:   Intra-op Plan:   Post-operative Plan: Extubation in OR  Informed Consent: I have reviewed the patients History and Physical, chart, labs and discussed the procedure including the risks, benefits and alternatives for the proposed anesthesia with the patient or authorized representative who has indicated his/her understanding and acceptance.     Dental advisory given  Plan Discussed with: CRNA  Anesthesia Plan Comments:        Anesthesia Quick Evaluation

## 2022-06-17 NOTE — Discharge Instructions (Signed)

## 2022-06-17 NOTE — Transfer of Care (Signed)
Immediate Anesthesia Transfer of Care Note  Patient: Stephen Burns  Procedure(s) Performed: REVERSE SHOULDER ARTHROPLASTY (Left: Shoulder)  Patient Location: PACU  Anesthesia Type:GA combined with regional for post-op pain  Level of Consciousness: awake, alert , oriented, and patient cooperative  Airway & Oxygen Therapy: Patient Spontanous Breathing and Patient connected to face mask oxygen  Post-op Assessment: Report given to RN and Post -op Vital signs reviewed and stable  Post vital signs: Reviewed and stable  Last Vitals:  Vitals Value Taken Time  BP 120/68 06/17/22 1116  Temp    Pulse 66 06/17/22 1119  Resp 14 06/17/22 1119  SpO2 99 % 06/17/22 1119  Vitals shown include unvalidated device data.  Last Pain:  Vitals:   06/17/22 0735  PainSc: 0-No pain         Complications: No notable events documented.

## 2022-06-17 NOTE — Anesthesia Postprocedure Evaluation (Signed)
Anesthesia Post Note  Patient: Stephen Burns  Procedure(s) Performed: REVERSE SHOULDER ARTHROPLASTY (Left: Shoulder)     Patient location during evaluation: PACU Anesthesia Type: Regional and General Level of consciousness: awake Pain management: pain level controlled Vital Signs Assessment: post-procedure vital signs reviewed and stable Respiratory status: spontaneous breathing, nonlabored ventilation and respiratory function stable Cardiovascular status: blood pressure returned to baseline and stable Postop Assessment: no apparent nausea or vomiting Anesthetic complications: no   No notable events documented.  Last Vitals:  Vitals:   06/17/22 1235 06/17/22 1300  BP: 106/60 105/62  Pulse: 60 62  Resp: 16 16  Temp: 36.7 C   SpO2: 97% 97%    Last Pain:  Vitals:   06/17/22 1300  PainSc: 0-No pain                 Deionna Marcantonio P Siarah Deleo

## 2022-06-17 NOTE — H&P (Signed)
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 male with chronic and progressively increasing left shoulder pain related to severe osteoarthritis and associated rotator cuff dysfunction.  Due to increasing pain and functional imitations and failure to respond to prolonged attempts of conservative management, patient is brought to the operating room at this time for planned left shoulder reverse arthroplasty  Past Medical History:  Diagnosis Date   Arthritis    Complication of anesthesia    Difficult to wake up   Depression    Diabetes mellitus without complication (HCC)    Headache    History of kidney stones    Pulmonary embolism (HCC)    After shoulder surgery   Vertigo       Past Surgical History:  Procedure Laterality Date   ADENOIDECTOMY Bilateral    FINGER SURGERY     Left middle   FOOT SURGERY Left    laceration/ tendon repair   forehead surgery     L4-L5 fusion     LITHOTRIPSY     Prostrate     Frozen   SHOULDER SURGERY     Complete replacement to right shoulder   TONSILLECTOMY     TRANSURETHRAL RESECTION OF PROSTATE N/A 09/02/2020   Procedure: TRANSURETHRAL RESECTION OF THE PROSTATE (TURP);  Surgeon: Bjorn Pippin, MD;  Location: [facility];  Service: Urology;  Laterality: N/A;    No family history on file.  Social History:  reports that he has never smoked. He has never used smokeless tobacco. He reports that he does not currently use alcohol. He reports that he does not use drugs.  BMI: Estimated body mass index is 28.73 kg/m as calculated from the following:   Height as of 06/10/22: 5\' 11"  (1.803 m).   Weight as of 06/10/22: 93.4 kg.  Lab Results  Component Value Date   ALBUMIN 4.1 06/10/2022   Diabetes:   Patient has a diagnosis of diabetes,  Lab Results  Component Value Date   HGBA1C 6.6 (H) 06/10/2022   Smoking Status:   reports that he has never smoked. He has never used  smokeless tobacco.     No medications prior to admission.     Physical Exam: Left shoulder demonstrates painful and guarded motion is noted at recent office visits.  Globally decreased strength and markedly restricted mobility.  Examination otherwise as noted at recent office visits.  Imaging studies confirmed changes consistent with severe osteoarthritis and associated rotator cuff dysfunction.  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

## 2022-06-22 ENCOUNTER — Encounter (HOSPITAL_COMMUNITY): Payer: Self-pay | Admitting: Orthopedic Surgery

## 2022-06-30 DIAGNOSIS — Z471 Aftercare following joint replacement surgery: Secondary | ICD-10-CM | POA: Diagnosis not present

## 2022-06-30 DIAGNOSIS — Z96612 Presence of left artificial shoulder joint: Secondary | ICD-10-CM | POA: Diagnosis not present

## 2022-07-07 DIAGNOSIS — R3915 Urgency of urination: Secondary | ICD-10-CM | POA: Diagnosis not present

## 2022-07-07 DIAGNOSIS — N200 Calculus of kidney: Secondary | ICD-10-CM | POA: Diagnosis not present

## 2022-07-07 DIAGNOSIS — N401 Enlarged prostate with lower urinary tract symptoms: Secondary | ICD-10-CM | POA: Diagnosis not present

## 2022-07-08 DIAGNOSIS — Z96652 Presence of left artificial knee joint: Secondary | ICD-10-CM | POA: Diagnosis not present

## 2022-07-21 DIAGNOSIS — M25512 Pain in left shoulder: Secondary | ICD-10-CM | POA: Insufficient documentation

## 2022-07-28 DIAGNOSIS — M25512 Pain in left shoulder: Secondary | ICD-10-CM | POA: Diagnosis not present

## 2022-07-30 DIAGNOSIS — M25512 Pain in left shoulder: Secondary | ICD-10-CM | POA: Diagnosis not present

## 2022-08-04 DIAGNOSIS — M25512 Pain in left shoulder: Secondary | ICD-10-CM | POA: Diagnosis not present

## 2022-08-06 DIAGNOSIS — M65332 Trigger finger, left middle finger: Secondary | ICD-10-CM | POA: Diagnosis not present

## 2022-08-06 DIAGNOSIS — M25512 Pain in left shoulder: Secondary | ICD-10-CM | POA: Diagnosis not present

## 2022-08-06 DIAGNOSIS — M79641 Pain in right hand: Secondary | ICD-10-CM | POA: Insufficient documentation

## 2022-08-11 DIAGNOSIS — M25512 Pain in left shoulder: Secondary | ICD-10-CM | POA: Diagnosis not present

## 2022-08-13 DIAGNOSIS — M25512 Pain in left shoulder: Secondary | ICD-10-CM | POA: Diagnosis not present

## 2022-08-17 ENCOUNTER — Other Ambulatory Visit: Payer: Self-pay | Admitting: Internal Medicine

## 2022-08-17 DIAGNOSIS — E1140 Type 2 diabetes mellitus with diabetic neuropathy, unspecified: Secondary | ICD-10-CM

## 2022-08-17 DIAGNOSIS — E782 Mixed hyperlipidemia: Secondary | ICD-10-CM

## 2022-08-18 DIAGNOSIS — M25512 Pain in left shoulder: Secondary | ICD-10-CM | POA: Diagnosis not present

## 2022-08-24 ENCOUNTER — Other Ambulatory Visit: Payer: Self-pay | Admitting: Internal Medicine

## 2022-08-24 DIAGNOSIS — E1149 Type 2 diabetes mellitus with other diabetic neurological complication: Secondary | ICD-10-CM

## 2022-08-24 DIAGNOSIS — Z981 Arthrodesis status: Secondary | ICD-10-CM

## 2022-08-31 DIAGNOSIS — M25512 Pain in left shoulder: Secondary | ICD-10-CM | POA: Diagnosis not present

## 2022-09-02 ENCOUNTER — Other Ambulatory Visit: Payer: Medicare HMO

## 2022-09-02 DIAGNOSIS — E782 Mixed hyperlipidemia: Secondary | ICD-10-CM | POA: Diagnosis not present

## 2022-09-02 DIAGNOSIS — E1140 Type 2 diabetes mellitus with diabetic neuropathy, unspecified: Secondary | ICD-10-CM | POA: Diagnosis not present

## 2022-09-02 DIAGNOSIS — Z794 Long term (current) use of insulin: Secondary | ICD-10-CM | POA: Diagnosis not present

## 2022-09-02 LAB — COMPLETE METABOLIC PANEL WITH GFR
AG Ratio: 1.3 (calc) (ref 1.0–2.5)
AST: 14 U/L (ref 10–35)
Albumin: 3.7 g/dL (ref 3.6–5.1)
Alkaline phosphatase (APISO): 99 U/L (ref 35–144)
BUN: 19 mg/dL (ref 7–25)
CO2: 27 mmol/L (ref 20–32)
Calcium: 9.4 mg/dL (ref 8.6–10.3)
Chloride: 105 mmol/L (ref 98–110)
Creat: 0.82 mg/dL (ref 0.70–1.22)
Globulin: 2.8 g/dL (calc) (ref 1.9–3.7)
Glucose, Bld: 111 mg/dL — ABNORMAL HIGH (ref 65–99)
Potassium: 4.2 mmol/L (ref 3.5–5.3)
Sodium: 139 mmol/L (ref 135–146)
Total Bilirubin: 0.6 mg/dL (ref 0.2–1.2)
Total Protein: 6.5 g/dL (ref 6.1–8.1)
eGFR: 88 mL/min/{1.73_m2} (ref >=60)

## 2022-09-02 LAB — CBC WITH DIFFERENTIAL/PLATELET
Absolute Monocytes: 573 cells/uL (ref 200–950)
Basophils Absolute: 19 cells/uL (ref 0–200)
Basophils Relative: 0.4 %
Eosinophils Relative: 3.4 %
Hemoglobin: 14.3 g/dL (ref 13.2–17.1)
Lymphs Abs: 1222 cells/uL (ref 850–3900)
MCH: 31 pg (ref 27.0–33.0)
MCHC: 34.1 g/dL (ref 32.0–36.0)
MCV: 90.7 fL (ref 80.0–100.0)
MPV: 9.9 fL (ref 7.5–12.5)
Monocytes Relative: 12.2 %
Neutro Abs: 2726 cells/uL (ref 1500–7800)
Neutrophils Relative %: 58 %
Platelets: 271 10*3/uL (ref 140–400)
RBC: 4.62 10*6/uL (ref 4.20–5.80)
WBC: 4.7 10*3/uL (ref 3.8–10.8)

## 2022-09-03 DIAGNOSIS — M25512 Pain in left shoulder: Secondary | ICD-10-CM | POA: Diagnosis not present

## 2022-09-03 LAB — HEMOGLOBIN A1C
Mean Plasma Glucose: 160 mg/dL
eAG (mmol/L): 8.9 mmol/L

## 2022-09-03 LAB — CBC WITH DIFFERENTIAL/PLATELET: RDW: 12.6 % (ref 11.0–15.0)

## 2022-09-07 DIAGNOSIS — M25512 Pain in left shoulder: Secondary | ICD-10-CM | POA: Diagnosis not present

## 2022-09-08 ENCOUNTER — Encounter: Payer: Self-pay | Admitting: Internal Medicine

## 2022-09-08 ENCOUNTER — Non-Acute Institutional Stay: Payer: Medicare HMO | Admitting: Internal Medicine

## 2022-09-08 VITALS — BP 118/70 | HR 80 | Temp 97.7°F | Resp 17 | Ht 71.0 in | Wt 207.6 lb

## 2022-09-08 DIAGNOSIS — Z794 Long term (current) use of insulin: Secondary | ICD-10-CM

## 2022-09-08 DIAGNOSIS — G4762 Sleep related leg cramps: Secondary | ICD-10-CM

## 2022-09-08 DIAGNOSIS — E782 Mixed hyperlipidemia: Secondary | ICD-10-CM

## 2022-09-08 DIAGNOSIS — E1140 Type 2 diabetes mellitus with diabetic neuropathy, unspecified: Secondary | ICD-10-CM

## 2022-09-08 DIAGNOSIS — H8113 Benign paroxysmal vertigo, bilateral: Secondary | ICD-10-CM

## 2022-09-08 DIAGNOSIS — R4189 Other symptoms and signs involving cognitive functions and awareness: Secondary | ICD-10-CM | POA: Diagnosis not present

## 2022-09-08 NOTE — Progress Notes (Unsigned)
Location:  [facility] features editor of Service:  Clinic (12)  Provider:   Code Status: Full Code Goals of Care:     09/08/2022    9:54 AM  Advanced Directives  Does Patient Have a Medical Advance Directive? Yes  Type of Estate agent of [place];Living will;Out of facility DNR (pink MOST or yellow form)  Does patient want to make changes to medical advance directive? No - Patient declined  Copy of Healthcare Power of Attorney in Chart? No - copy requested     Chief Complaint  Patient presents with   Medical Management of Chronic Issues    Patient is being seen for follow up and labs    Immunizations    Discussed the need for shingles, tdap, and updated covid need    Health Maintenance    Discuss need for diabetic kidney evaluation    HPI: Patient is a 82 y.o. male seen today for medical management of chronic diseases.   [place]  Patient has h/o Type 2 diabetes on insulin  CBGS 140-175 A1C elevated this time Mostly due to his surgery S/p Left Shoulder Arthroplasty per Dr Rennis Chris Pain Controlled with Tylenol Working with therapy Cognitive impairment Per Patient it has been noticed by her son  He still drives and does his finances Some recent stress due to expense of living in [facility]   BPPV Resolved after therapy Does home Therapy which helps Tinnitus with hearing loss also present Uses Meclizine PRN H/o Lung Nodules  Mostly benign follows with Dr Delton Coombes  H/o Right Shoulder replacement, h/o Lumbar Fusion, Arthritis,  Also h/o PE after shoulder replacement, HLD  He underwent TKA on 05/19/21  Past Medical History:  Diagnosis Date   Arthritis    Complication of anesthesia    Difficult to wake up   Depression    Diabetes mellitus without complication (HCC)    Headache    History of kidney stones    Pulmonary embolism (HCC)    After shoulder surgery   Vertigo     Past Surgical History:   Procedure Laterality Date   ADENOIDECTOMY Bilateral    FINGER SURGERY     Left middle   FOOT SURGERY Left    laceration/ tendon repair   forehead surgery     L4-L5 fusion     LITHOTRIPSY     Prostrate     Frozen   REVERSE SHOULDER ARTHROPLASTY Left 06/17/2022   Procedure: REVERSE SHOULDER ARTHROPLASTY;  Surgeon: Francena Hanly, MD;  Location: [facility];  Service: Orthopedics;  Laterality: Left;    SHOULDER SURGERY     Complete replacement to right shoulder   TONSILLECTOMY     TRANSURETHRAL RESECTION OF PROSTATE N/A 09/02/2020   Procedure: TRANSURETHRAL RESECTION OF THE PROSTATE (TURP);  Surgeon: Bjorn Pippin, MD;  Location: [facility];  Service: Urology;  Laterality: N/A;    Allergies  Allergen Reactions   Sulfa Antibiotics Swelling    Joint swelling    Outpatient Encounter Medications as of 09/08/2022  Medication Sig   acetaminophen (TYLENOL) 500 MG tablet Take 500 mg by mouth 2 (two) times daily.   atorvastatin (LIPITOR) 40 MG tablet Take 1 tablet (40 mg total) by mouth daily. E78.2 (Patient taking differently: Take 40 mg by mouth at bedtime. E78.2)   carbidopa-levodopa (SINEMET CR) 50-200 MG tablet TAKE 1 TABLET BY MOUTH TWICE A DAY   fluticasone (FLONASE) 50 MCG/ACT nasal spray Place 1 spray  into both nostrils daily as needed for allergies or rhinitis.   gabapentin (NEURONTIN) 300 MG capsule TAKE TWO CAPSULES IN THE MORNING, TAKE ONE IN THE AFTERNOON AND TAKE TWO AT BEDTIME.   hydrocortisone cream 1 % Apply 1 Application topically 2 (two) times daily as needed for itching.   Insulin Glargine (BASAGLAR KWIKPEN) 100 UNIT/ML Inject 35 Units into the skin in the morning. E11.40   meclizine (ANTIVERT) 25 MG tablet Take 1 tablet (25 mg total) by mouth 3 (three) times daily as needed for dizziness.   MULTIPLE MINERALS PO Take 1 tablet by mouth daily.   OVER THE COUNTER MEDICATION Apply 1 Application topically daily as needed (dry/itchy skin). Dermasil dry skin lotion   polyethylene  glycol (MIRALAX / GLYCOLAX) 17 g packet Take 17 g by mouth daily.   [DISCONTINUED] ondansetron (ZOFRAN) 4 MG tablet Take 1 tablet (4 mg total) by mouth every 8 (eight) hours as needed for nausea or vomiting.   [DISCONTINUED] rivaroxaban (XARELTO) 10 MG TABS tablet Take 1 tablet (10 mg total) by mouth daily. After day 14 , take 1 aspirin daily   [DISCONTINUED] oxyCODONE-acetaminophen (PERCOCET) 5-325 MG tablet Take 1 tablet by mouth every 4 (four) hours as needed (max 6 q, 1 q 6 hrs prn severe pain not controlled by tramadol). (Patient not taking: Reported on 09/08/2022)   [DISCONTINUED] traMADol (ULTRAM) 50 MG tablet Take 1 tablet (50 mg total) by mouth every 6 (six) hours as needed for moderate pain. (Patient not taking: Reported on 09/08/2022)   No facility-administered encounter medications on file as of 09/08/2022.    Review of Systems:  Review of Systems  Constitutional:  Negative for activity change, appetite change and unexpected weight change.  HENT: Negative.    Respiratory:  Negative for cough and shortness of breath.   Cardiovascular:  Negative for leg swelling.  Gastrointestinal:  Negative for constipation.  Genitourinary:  Negative for frequency.  Musculoskeletal:  Positive for arthralgias. Negative for gait problem and myalgias.  Skin: Negative.  Negative for rash.  Neurological:  Negative for dizziness and weakness.  Psychiatric/Behavioral:  Positive for dysphoric mood. Negative for confusion and sleep disturbance.   All other systems reviewed and are negative.   Health Maintenance  Topic Date Due   Zoster Vaccines- Shingrix (1 of 2) Never done   DTaP/Tdap/Td (4 - Tdap) 06/10/2011   Diabetic kidney evaluation - Urine ACR  05/29/2021   COVID-19 Vaccine (7 - 2023-24 season) 02/09/2022   INFLUENZA VACCINE  09/09/2022   Medicare Annual Wellness (AWV)  01/05/2023   OPHTHALMOLOGY EXAM  03/04/2023   HEMOGLOBIN A1C  03/05/2023   FOOT EXAM  06/02/2023   Diabetic kidney  evaluation - eGFR measurement  09/02/2023   Pneumonia Vaccine 64+ Years old  Completed   HPV VACCINES  Aged Out    Physical Exam: Vitals:   09/08/22 0952  BP: 118/70  Pulse: 80  Resp: 17  Temp: 97.7 F (36.5 C)  TempSrc: Temporal  SpO2: 93%  Weight: 207 lb 9.6 oz (94.2 kg)  Height: 5\' 11"  (1.803 m)   Body mass index is 28.95 kg/m. Physical Exam Vitals reviewed.  Constitutional:      Appearance: Normal appearance.  HENT:     Head: Normocephalic.     Nose: Nose normal.     Mouth/Throat:     Mouth: Mucous membranes are moist.     Pharynx: Oropharynx is clear.  Eyes:     Pupils: Pupils are equal, round, and reactive to  light.  Cardiovascular:     Rate and Rhythm: Normal rate and regular rhythm.     Pulses: Normal pulses.     Heart sounds: No murmur heard. Pulmonary:     Effort: Pulmonary effort is normal. No respiratory distress.     Breath sounds: Normal breath sounds. No rales.  Abdominal:     General: Abdomen is flat. Bowel sounds are normal.     Palpations: Abdomen is soft.  Musculoskeletal:        General: No swelling.     Cervical back: Neck supple.  Skin:    General: Skin is warm.  Neurological:     General: No focal deficit present.     Mental Status: He is alert and oriented to person, place, and time.  Psychiatric:        Mood and Affect: Mood normal.        Thought Content: Thought content normal.     Labs reviewed: Basic Metabolic Panel: Recent Labs    04/27/22 0708 06/10/22 1333 09/02/22 0753  NA 141 137 139  K 4.6 4.1 4.2  CL 106 104 105  CO2 29 25 27   GLUCOSE 128* 228* 111*  BUN 25 25* 19  CREATININE 0.82 0.94 0.82  CALCIUM 9.4 9.0 9.4  TSH 1.26  --   --    Liver Function Tests: Recent Labs    12/13/21 0126 04/27/22 0708 06/10/22 1333 09/02/22 0753  AST 24 15 21 14   ALT 5 5* 7 7*  ALKPHOS 92  --  83  --   BILITOT 0.9 0.6 0.6 0.6  PROT 6.4* 6.8 7.2 6.5  ALBUMIN 3.8  --  4.1  --    No results for input(s): "LIPASE",  "AMYLASE" in the last 8760 hours. No results for input(s): "AMMONIA" in the last 8760 hours. CBC: Recent Labs    01/07/22 0000 04/27/22 0708 06/10/22 1333 09/02/22 0753  WBC 5.0 3.8 6.6 4.7  NEUTROABS 3,310.00 2,307  --  2,726  HGB 14.7 14.6 14.2 14.3  HCT 43 43.4 43.7 41.9  MCV  --  90.4 93.8 90.7  PLT 226 251 237 271   Lipid Panel: Recent Labs    01/21/22 0749 04/27/22 0708 09/02/22 0753  CHOL 120 113 120  HDL 43 39* 39*  LDLCALC 63 60 67  TRIG 67 61 64  CHOLHDL 2.8 2.9 3.1   Lab Results  Component Value Date   HGBA1C 7.2 (H) 09/02/2022    Procedures since last visit: No results found.  Assessment/Plan 1. Type 2 diabetes mellitus with diabetic neuropathy, with long-term current use of insulin (HCC) A1C slightly high He just went through surgery Will Repeat in 3 month No change in Insulin  2. Mixed hyperlipidemia On statin LDL good levels  3. Cognitive impairment Noticed by his son Will talk more in next visit as he has had recent surgery also  4. BPPV (benign paroxysmal positional vertigo), bilateral Doing well Uses Meclizine ? Tinnitus and hearing loss Have discussed ENT referral for [place]  5. Nocturnal leg cramps Sinemet and Neurontin 6 Mild Depression  7 s/p Left Shoulder arthroplasty Continue Therapy Pain controlled with Tylenol    Labs/tests ordered:  * No order type specified * Next appt:  Visit date not found

## 2022-09-09 DIAGNOSIS — M25512 Pain in left shoulder: Secondary | ICD-10-CM | POA: Diagnosis not present

## 2022-09-13 ENCOUNTER — Telehealth: Payer: Self-pay | Admitting: *Deleted

## 2022-09-13 NOTE — Telephone Encounter (Signed)
Patient wife called back and was wanting to know if a Rx was going to be sent to pharmacy so she could pick it up.   Please Advise.

## 2022-09-13 NOTE — Telephone Encounter (Signed)
Rosey Bath, nurse with [facility] called and stated that patient tested POSITIVE for Covid this morning.  Cold Symptoms, no fever. No other symptoms noted.    Please Advise. (No available appointment.)

## 2022-09-14 ENCOUNTER — Encounter: Payer: Self-pay | Admitting: Internal Medicine

## 2022-09-14 ENCOUNTER — Telehealth (INDEPENDENT_AMBULATORY_CARE_PROVIDER_SITE_OTHER): Payer: Medicare HMO | Admitting: Internal Medicine

## 2022-09-14 VITALS — Temp 101.0°F

## 2022-09-14 DIAGNOSIS — U071 COVID-19: Secondary | ICD-10-CM | POA: Diagnosis not present

## 2022-09-14 MED ORDER — NIRMATRELVIR/RITONAVIR (PAXLOVID)TABLET: 3.0000 | ORAL_TABLET | Freq: Two times a day (BID) | ORAL | 0 refills | Status: DC

## 2022-09-14 MED ORDER — NIRMATRELVIR/RITONAVIR (PAXLOVID)TABLET: 3.0000 | ORAL_TABLET | Freq: Two times a day (BID) | ORAL | 0 refills | Status: AC

## 2022-09-14 NOTE — Telephone Encounter (Signed)
Spoke with patient and MyChart Visit Scheduled with Dr. Chales Abrahams for 09/14/2022

## 2022-09-14 NOTE — Progress Notes (Signed)
This service is provided via telemedicine  No vital signs collected/recorded due to the encounter was a telemedicine visit.   Location of patient (ex: home, work):  home  Patient consents to a telephone visit:  yes   Location of the provider (ex: office, home):  office  Name of any referring provider:  Mahlon Gammon, MD   Names of all persons participating in the telemedicine service and their role in the encounter:  Nehemiah Settle , Patient , and Mahlon Gammon, MD    Time spent on call:

## 2022-09-14 NOTE — Progress Notes (Signed)
Location:     Place of Service:     Provider:   Code Status:  Goals of Care:     09/14/2022   10:47 AM  Advanced Directives  Does Patient Have a Medical Advance Directive? Yes  Type of Estate agent of [place];Living will;Out of facility DNR (pink MOST or yellow form)  Does patient want to make changes to medical advance directive? No - Patient declined  Copy of Healthcare Power of Attorney in Chart? No - copy requested     Chief Complaint  Patient presents with   Acute Visit    Patient states he tested positive for covid and wants medication. Patient has runny nose , 101 temp, body aches     HPI: Patient is a 82 y.o. male seen today for an acute visit for ovid Infection [place] in [place]   Virtual Visit via Telephone Note  I connected with Stephen Burns   by telephone and verified that I am speaking with the correct person using two identifiers.    I discussed the limitations, risks, security and privacy concerns of performing an evaluation and management service by telephone and the availability of in person appointments. I also discussed with the patient that there may be a patient responsible charge related to this service. The patient expressed understanding and agreed to proceed.  He developed symptoms of fever and Fatigue yesterday Checked himself for Covid and is positive Fever better took aspirin for it. No Chest pain or SOB    I provided 25 minutes of non-face-to-face time during this encounter.      Patient has h/o Type 2 diabetes on insulin  CBGS 140-175 A1C elevated this time Mostly due to his surgery S/p Left Shoulder Arthroplasty per Dr Rennis Chris Pain Controlled with Tylenol Working with therapy Cognitive impairment Per Patient it has been noticed by her son  He still drives and does his finances Some recent stress due to expense of living in [facility]  Past Medical History:  Diagnosis Date   Arthritis     Complication of anesthesia    Difficult to wake up   Depression    Diabetes mellitus without complication (HCC)    Headache    History of kidney stones    Pulmonary embolism (HCC)    After shoulder surgery   Vertigo     Past Surgical History:  Procedure Laterality Date   ADENOIDECTOMY Bilateral    FINGER SURGERY     Left middle   FOOT SURGERY Left    laceration/ tendon repair   forehead surgery     L4-L5 fusion     LITHOTRIPSY     Prostrate     Frozen   REVERSE SHOULDER ARTHROPLASTY Left 06/17/2022   Procedure: REVERSE SHOULDER ARTHROPLASTY;  Surgeon: Francena Hanly, MD;  Location: [facility];  Service: Orthopedics;  Laterality: Left;    SHOULDER SURGERY     Complete replacement to right shoulder   TONSILLECTOMY     TRANSURETHRAL RESECTION OF PROSTATE N/A 09/02/2020   Procedure: TRANSURETHRAL RESECTION OF THE PROSTATE (TURP);  Surgeon: Bjorn Pippin, MD;  Location: [facility];  Service: Urology;  Laterality: N/A;    Allergies  Allergen Reactions   Sulfa Antibiotics Swelling    Joint swelling    Outpatient Encounter Medications as of 09/14/2022  Medication Sig   acetaminophen (TYLENOL) 500 MG tablet Take 500 mg by mouth 2 (two) times daily.   atorvastatin (LIPITOR) 40 MG tablet Take  1 tablet (40 mg total) by mouth daily. E78.2 (Patient taking differently: Take 40 mg by mouth at bedtime. E78.2)   carbidopa-levodopa (SINEMET CR) 50-200 MG tablet TAKE 1 TABLET BY MOUTH TWICE A DAY   fluticasone (FLONASE) 50 MCG/ACT nasal spray Place 1 spray into both nostrils daily as needed for allergies or rhinitis.   gabapentin (NEURONTIN) 300 MG capsule TAKE TWO CAPSULES IN THE MORNING, TAKE ONE IN THE AFTERNOON AND TAKE TWO AT BEDTIME.   hydrocortisone cream 1 % Apply 1 Application topically 2 (two) times daily as needed for itching.   Insulin Glargine (BASAGLAR KWIKPEN) 100 UNIT/ML Inject 35 Units into the skin in the morning. E11.40   meclizine (ANTIVERT) 25 MG tablet Take 1 tablet (25 mg  total) by mouth 3 (three) times daily as needed for dizziness.   MULTIPLE MINERALS PO Take 1 tablet by mouth daily.   polyethylene glycol (MIRALAX / GLYCOLAX) 17 g packet Take 17 g by mouth daily.   [DISCONTINUED] nirmatrelvir/ritonavir (PAXLOVID) 20 x 150 MG & 10 x 100MG  TABS Take 3 tablets by mouth 2 (two) times daily for 5 days. (Take nirmatrelvir 150 mg two tablets twice daily for 5 days and ritonavir 100 mg one tablet twice daily for 5 days) Patient GFR is   nirmatrelvir/ritonavir (PAXLOVID) 20 x 150 MG & 10 x 100MG  TABS Take 3 tablets by mouth 2 (two) times daily for 5 days. (Take nirmatrelvir 150 mg two tablets twice daily for 5 days and ritonavir 100 mg one tablet twice daily for 5 days) Patient GFR is 80   OVER THE COUNTER MEDICATION Apply 1 Application topically daily as needed (dry/itchy skin). Dermasil dry skin lotion (Patient not taking: Reported on 09/14/2022)   No facility-administered encounter medications on file as of 09/14/2022.    Review of Systems:  Review of Systems  Constitutional:  Positive for activity change, appetite change and fatigue. Negative for unexpected weight change.  HENT: Negative.    Respiratory:  Positive for cough. Negative for shortness of breath.   Cardiovascular:  Negative for leg swelling.  Gastrointestinal:  Negative for constipation.  Genitourinary:  Negative for frequency.  Musculoskeletal:  Negative for arthralgias, gait problem and myalgias.  Skin: Negative.  Negative for rash.  Neurological:  Positive for weakness. Negative for dizziness.  Psychiatric/Behavioral:  Negative for confusion and sleep disturbance.   All other systems reviewed and are negative.   Health Maintenance  Topic Date Due   Zoster Vaccines- Shingrix (1 of 2) Never done   DTaP/Tdap/Td (4 - Tdap) 06/10/2011   Diabetic kidney evaluation - Urine ACR  05/29/2021   COVID-19 Vaccine (7 - 2023-24 season) 02/09/2022   INFLUENZA VACCINE  09/09/2022   Medicare Annual Wellness  (AWV)  01/05/2023   OPHTHALMOLOGY EXAM  03/04/2023   HEMOGLOBIN A1C  03/05/2023   FOOT EXAM  06/02/2023   Diabetic kidney evaluation - eGFR measurement  09/02/2023   Pneumonia Vaccine 69+ Years old  Completed   HPV VACCINES  Aged Out    Physical Exam: Vitals:   09/14/22 1044  Temp: (!) 101 F (38.3 C)  TempSrc: Temporal   There is no height or weight on file to calculate BMI. Physical Exam Constitutional:      Appearance: Normal appearance.  HENT:     Head: Normocephalic.     Nose: Nose normal.  Neurological:     Mental Status: He is alert and oriented to person, place, and time.  Psychiatric:  Mood and Affect: Mood normal.        Thought Content: Thought content normal.     Labs reviewed: Basic Metabolic Panel: Recent Labs    04/27/22 0708 06/10/22 1333 09/02/22 0753  NA 141 137 139  K 4.6 4.1 4.2  CL 106 104 105  CO2 29 25 27   GLUCOSE 128* 228* 111*  BUN 25 25* 19  CREATININE 0.82 0.94 0.82  CALCIUM 9.4 9.0 9.4  TSH 1.26  --   --    Liver Function Tests: Recent Labs    12/13/21 0126 04/27/22 0708 06/10/22 1333 09/02/22 0753  AST 24 15 21 14   ALT 5 5* 7 7*  ALKPHOS 92  --  83  --   BILITOT 0.9 0.6 0.6 0.6  PROT 6.4* 6.8 7.2 6.5  ALBUMIN 3.8  --  4.1  --    No results for input(s): "LIPASE", "AMYLASE" in the last 8760 hours. No results for input(s): "AMMONIA" in the last 8760 hours. CBC: Recent Labs    01/07/22 0000 04/27/22 0708 06/10/22 1333 09/02/22 0753  WBC 5.0 3.8 6.6 4.7  NEUTROABS 3,310.00 2,307  --  2,726  HGB 14.7 14.6 14.2 14.3  HCT 43 43.4 43.7 41.9  MCV  --  90.4 93.8 90.7  PLT 226 251 237 271   Lipid Panel: Recent Labs    01/21/22 0749 04/27/22 0708 09/02/22 0753  CHOL 120 113 120  HDL 43 39* 39*  LDLCALC 63 60 67  TRIG 67 61 64  CHOLHDL 2.8 2.9 3.1   Lab Results  Component Value Date   HGBA1C 7.2 (H) 09/02/2022    Procedures since last visit: No results found.  Assessment/Plan COVID-19 virus  infection Paxlovid ordered Hold Statin when taking Paxlovid Go to ED if fever or SOB  I discussed the assessment and treatment plan with the patient. The patient was provided an opportunity to ask questions and all were answered. The patient agreed with the plan and demonstrated an understanding of the instructions.   The patient was advised to call back or seek an in-person evaluation if the symptoms worsen or if the condition fails to improve as anticipated.   Labs/tests ordered:  * No order type specified * Next appt:  12/06/2022

## 2022-09-22 ENCOUNTER — Telehealth: Payer: Medicare HMO

## 2022-09-22 NOTE — Telephone Encounter (Signed)
Request for accu chek guide me glucose meter and supplies received from pharmacy. How often should patient check blood glucose level.   Message sent to Dr. Einar Crow.

## 2022-09-27 ENCOUNTER — Telehealth: Payer: Self-pay | Admitting: Emergency Medicine

## 2022-09-28 NOTE — Telephone Encounter (Signed)
Twice a day

## 2022-09-29 ENCOUNTER — Other Ambulatory Visit: Payer: Self-pay

## 2022-09-29 ENCOUNTER — Ambulatory Visit: Payer: PRIVATE HEALTH INSURANCE | Admitting: Emergency Medicine

## 2022-09-29 MED ORDER — ACCU-CHEK GUIDE VI STRP: ORAL_STRIP | 12 refills | Status: AC

## 2022-09-29 MED ORDER — ACCU-CHEK GUIDE ME W/DEVICE KIT: 1.0000 | PACK | Freq: Every day | 0 refills | Status: DC

## 2022-09-29 MED ORDER — ACCU-CHEK SOFTCLIX LANCETS MISC: 12 refills | Status: AC

## 2022-09-30 DIAGNOSIS — Z471 Aftercare following joint replacement surgery: Secondary | ICD-10-CM | POA: Diagnosis not present

## 2022-09-30 DIAGNOSIS — Z96612 Presence of left artificial shoulder joint: Secondary | ICD-10-CM | POA: Diagnosis not present

## 2022-10-18 ENCOUNTER — Other Ambulatory Visit: Payer: Self-pay | Admitting: *Deleted

## 2022-10-18 DIAGNOSIS — E1140 Type 2 diabetes mellitus with diabetic neuropathy, unspecified: Secondary | ICD-10-CM

## 2022-10-18 MED ORDER — BASAGLAR KWIKPEN 100 UNIT/ML ~~LOC~~ SOPN: 35.0000 [IU] | PEN_INJECTOR | Freq: Every morning | SUBCUTANEOUS | 5 refills | Status: DC

## 2022-10-18 NOTE — Telephone Encounter (Signed)
Pharmacy requested refill

## 2022-10-19 ENCOUNTER — Ambulatory Visit (INDEPENDENT_AMBULATORY_CARE_PROVIDER_SITE_OTHER): Payer: PRIVATE HEALTH INSURANCE | Admitting: Emergency Medicine

## 2022-10-19 ENCOUNTER — Encounter: Payer: Self-pay | Admitting: Emergency Medicine

## 2022-10-19 VITALS — BP 120/66 | HR 74 | Ht 74.0 in | Wt 207.8 lb

## 2022-10-19 DIAGNOSIS — J309 Allergic rhinitis, unspecified: Secondary | ICD-10-CM

## 2022-10-19 DIAGNOSIS — R911 Solitary pulmonary nodule: Secondary | ICD-10-CM

## 2022-10-19 NOTE — Assessment & Plan Note (Signed)
Symptomatic relief.  He could start a dedicated antihistamine if more bothersome

## 2022-10-19 NOTE — Assessment & Plan Note (Signed)
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  He had this originally documented in [place], [place] but I do not have those films to compare.  We will repeat his CT 1 more time.  If stable then he should not need any more serial imaging

## 2022-10-19 NOTE — Patient Instructions (Signed)
We will repeat your CT scan of the chest to ensure no interval change.  If this scan is stable then you should not need any more surveillance scans to follow your pulmonary nodule. Agree with getting the COVID-19 vaccine this fall Follow-up with APP or Dr. Delton Coombes after your CT chest so we can review those results together.

## 2022-10-19 NOTE — Progress Notes (Signed)
Subjective:    Patient ID: Stephen Burns, male    DOB: 1940/08/09, 82 y.o.   MRN: [id number]  HPI  ROV 10/19/2022 --Stephen Burns is 24, a never smoker with history of osteoarthritis, PE in 2015, diabetes.  He is low risk for primary lung cancer.  I have seen him for pulmonary nodular disease on chest imaging that included a 3 x 2.6 cm partially calcified posterior medial right lower lobe pulmonary lesion. He returns for follow-up.  I do not see that he has had any repeat chest imaging.  I do not have any old scans or reports from [place] He had COVID-19 one month ago, mainly URI sx and cough. Took Paxlovid. He is better but is having some allergy sx. Had a L shoulder replacement that went ok.    Review of Systems As per HPI  Past Medical History:  Diagnosis Date   Arthritis    Complication of anesthesia    Difficult to wake up   Depression    Diabetes mellitus without complication (HCC)    Headache    History of kidney stones    Pulmonary embolism (HCC)    After shoulder surgery   Vertigo      No family history on file.   Social History   Socioeconomic History   Marital status: Married    Spouse name: Not on file   Number of children: Not on file   Years of education: Not on file   Highest education level: Not on file  Occupational History   Not on file  Tobacco Use   Smoking status: Never   Smokeless tobacco: Never  Vaping Use   Vaping status: Never Used  Substance and Sexual Activity   Alcohol use: Not Currently   Drug use: Never   Sexual activity: Not Currently  Other Topics Concern   Not on file  Social History Narrative   Not on file   Social Determinants of Health   Financial Resource Strain: Low Risk  (01/04/2022)   Overall Financial Resource Strain (CARDIA)    Difficulty of Paying Living Expenses: Not hard at all  Food Insecurity: No Food Insecurity (01/04/2022)   Hunger Vital Sign    Worried About Running Out of Food in the Last Year: Never  true    Ran Out of Food in the Last Year: Never true  Transportation Needs: No Transportation Needs (01/04/2022)   PRAPARE - Administrator, Civil Service (Medical): No    Lack of Transportation (Non-Medical): No  Physical Activity: Insufficiently Active (01/04/2022)   Exercise Vital Sign    Days of Exercise per Week: 7 days    Minutes of Exercise per Session: 10 min  Stress: Stress Concern Present (01/04/2022)   Stephen Burns [employer] Occupational Health - Occupational Stress Questionnaire    Feeling of Stress : To some extent  Social Connections: Moderately Integrated (01/04/2022)   Social Connection and Isolation Panel [NHANES]    Frequency of Communication with Friends and Family: More than three times a week    Frequency of Social Gatherings with Friends and Family: More than three times a week    Attends Religious Services: More than 4 times per year    Active Member of [name] or Organizations: No    Attends Banker Meetings: Never    Marital Status: Married  Catering manager Violence: Not At Risk (01/04/2022)   Humiliation, Afraid, Rape, and Kick questionnaire    Fear of Current or Ex-Partner:  No    Emotionally Abused: No    Physically Abused: No    Sexually Abused: No    Was in the [place] [employer].  He was exposed to asbestos.  Has worked as an Designer, fashion/clothing in industrial work Has lived in [place], [place]. [place].    Allergies  Allergen Reactions   Sulfa Antibiotics Swelling    Joint swelling     Outpatient Medications Prior to Visit  Medication Sig Dispense Refill   Accu-Chek Softclix Lancets lancets Use to check blood glucose level twice a day E11.40 100 each 12   acetaminophen (TYLENOL) 500 MG tablet Take 500 mg by mouth 2 (two) times daily.     atorvastatin (LIPITOR) 40 MG tablet Take 1 tablet (40 mg total) by mouth daily. E78.2 (Patient taking differently: Take 40 mg by mouth at bedtime. E78.2) 90 tablet 3   Blood  Glucose Monitoring Suppl (ACCU-CHEK GUIDE ME) w/Device KIT 1 kit by Does not apply route daily. DX: E11.40 1 kit 0   carbidopa-levodopa (SINEMET CR) 50-200 MG tablet TAKE 1 TABLET BY MOUTH TWICE A DAY 180 tablet 1   fluticasone (FLONASE) 50 MCG/ACT nasal spray Place 1 spray into both nostrils daily as needed for allergies or rhinitis.     gabapentin (NEURONTIN) 300 MG capsule TAKE TWO CAPSULES IN THE MORNING, TAKE ONE IN THE AFTERNOON AND TAKE TWO AT BEDTIME. 450 capsule 1   glucose blood (ACCU-CHEK GUIDE) test strip Use to test blood glucose level twice a day DX: E11.40 100 each 12   hydrocortisone cream 1 % Apply 1 Application topically 2 (two) times daily as needed for itching.     Insulin Glargine (BASAGLAR KWIKPEN) 100 UNIT/ML Inject 35 Units into the skin in the morning. E11.40 15 mL 5   meclizine (ANTIVERT) 25 MG tablet Take 1 tablet (25 mg total) by mouth 3 (three) times daily as needed for dizziness. 30 tablet 0   MULTIPLE MINERALS PO Take 1 tablet by mouth daily.     polyethylene glycol (MIRALAX / GLYCOLAX) 17 g packet Take 17 g by mouth daily.     OVER THE COUNTER MEDICATION Apply 1 Application topically daily as needed (dry/itchy skin). Dermasil dry skin lotion (Patient not taking: Reported on 09/14/2022)     No facility-administered medications prior to visit.        Objective:   Physical Exam  Vitals:   10/19/22 1431  BP: 120/66  Pulse: 74  SpO2: 96%  Weight: 207 lb 12.8 oz (94.3 kg)  Height: 6\' 2"  (1.88 m)    Gen: Pleasant, well-nourished, in no distress,  normal affect  ENT: No lesions,  mouth clear,  oropharynx clear, no postnasal drip  Neck: No JVD, no stridor  Lungs: No use of accessory muscles, no crackles or wheezing on normal respiration, no wheeze on forced expiration  Cardiovascular: RRR, heart sounds normal, no murmur or gallops, no peripheral edema Anor  Musculoskeletal: No deformities, no cyanosis or clubbing  Neuro: alert, awake, non focal  Skin:  Warm, no lesions or rash    Assessment & Plan:  Lung nodule seen on imaging study Low risk patient with partially calcified rounded pulmonary nodule.  He had this originally documented in [place], [place] but I do not have those films to compare.  We will repeat his CT 1 more time.  If stable then he should not need any more serial imaging  Allergic rhinitis Symptomatic relief.  He could start a dedicated antihistamine if  more bothersome    Levy Pupa, MD, PhD 10/19/2022, 2:44 PM [facility] [phone] or if no answer before 7:00PM call [phone] For any issues after 7:00PM please call eLink [phone]

## 2022-10-20 ENCOUNTER — Encounter: Payer: Self-pay | Admitting: Emergency Medicine

## 2022-10-29 ENCOUNTER — Other Ambulatory Visit: Payer: Self-pay | Admitting: Internal Medicine

## 2022-10-29 DIAGNOSIS — Z981 Arthrodesis status: Secondary | ICD-10-CM

## 2022-11-03 ENCOUNTER — Other Ambulatory Visit: Payer: PRIVATE HEALTH INSURANCE

## 2022-11-13 ENCOUNTER — Other Ambulatory Visit: Payer: Self-pay | Admitting: Family Medicine

## 2022-11-13 DIAGNOSIS — E1140 Type 2 diabetes mellitus with diabetic neuropathy, unspecified: Secondary | ICD-10-CM

## 2022-11-15 NOTE — Telephone Encounter (Signed)
Medication discontinued by another provider. Medication pend and sent to PCP Mahlon Gammon, MD

## 2022-12-01 ENCOUNTER — Ambulatory Visit: Payer: PRIVATE HEALTH INSURANCE | Admitting: Primary Care

## 2022-12-06 ENCOUNTER — Other Ambulatory Visit: Payer: Medicare HMO

## 2022-12-06 DIAGNOSIS — E1140 Type 2 diabetes mellitus with diabetic neuropathy, unspecified: Secondary | ICD-10-CM | POA: Diagnosis not present

## 2022-12-06 DIAGNOSIS — Z794 Long term (current) use of insulin: Secondary | ICD-10-CM | POA: Diagnosis not present

## 2022-12-06 LAB — BASIC METABOLIC PANEL
BUN: 19 (ref 4–21)
CO2: 31 — AB (ref 13–22)
Chloride: 104 (ref 99–108)
Creatinine: 0.9 (ref 0.6–1.3)
Glucose: 131
Potassium: 4.5 meq/L (ref 3.5–5.1)
Sodium: 140 (ref 137–147)

## 2022-12-06 LAB — COMPREHENSIVE METABOLIC PANEL
Calcium: 9.6 (ref 8.7–10.7)
eGFR: 84

## 2022-12-07 LAB — BASIC METABOLIC PANEL WITH GFR
BUN: 19 mg/dL (ref 7–25)
CO2: 31 mmol/L (ref 20–32)
Calcium: 9.6 mg/dL (ref 8.6–10.3)
Chloride: 104 mmol/L (ref 98–110)
Creat: 0.92 mg/dL (ref 0.70–1.22)
Glucose, Bld: 131 mg/dL — ABNORMAL HIGH (ref 65–99)
Potassium: 4.5 mmol/L (ref 3.5–5.3)
Sodium: 140 mmol/L (ref 135–146)
eGFR: 84 mL/min/{1.73_m2} (ref >=60)

## 2022-12-07 LAB — HEMOGLOBIN A1C
Hgb A1c MFr Bld: 7.1 %{Hb} — ABNORMAL HIGH (ref <5.7)
Mean Plasma Glucose: 157 mg/dL
eAG (mmol/L): 8.7 mmol/L

## 2022-12-15 ENCOUNTER — Non-Acute Institutional Stay: Payer: Medicare HMO | Admitting: Internal Medicine

## 2022-12-15 ENCOUNTER — Encounter: Payer: Self-pay | Admitting: Internal Medicine

## 2022-12-15 VITALS — BP 130/72 | HR 77 | Temp 97.7°F | Resp 17 | Ht 74.0 in | Wt 208.0 lb

## 2022-12-15 DIAGNOSIS — F330 Major depressive disorder, recurrent, mild: Secondary | ICD-10-CM | POA: Diagnosis not present

## 2022-12-15 DIAGNOSIS — H9193 Unspecified hearing loss, bilateral: Secondary | ICD-10-CM | POA: Diagnosis not present

## 2022-12-15 DIAGNOSIS — E1140 Type 2 diabetes mellitus with diabetic neuropathy, unspecified: Secondary | ICD-10-CM | POA: Diagnosis not present

## 2022-12-15 DIAGNOSIS — H9313 Tinnitus, bilateral: Secondary | ICD-10-CM

## 2022-12-15 DIAGNOSIS — Z794 Long term (current) use of insulin: Secondary | ICD-10-CM

## 2022-12-15 MED ORDER — ESCITALOPRAM OXALATE 5 MG PO TABS: 5.0000 mg | ORAL_TABLET | Freq: Every day | ORAL | 3 refills | Status: DC

## 2022-12-15 NOTE — Progress Notes (Signed)
Location:  [facility]:  Clinic (12)  Provider:   Code Status:  Goals of Care:     12/15/2022    8:37 AM  Advanced Directives  Does Patient Have a Medical Advance Directive? Yes  Type of Estate agent of [place];Living will  Does patient want to make changes to medical advance directive? No - Patient declined  Copy of Healthcare Power of Attorney in Chart? No - copy requested     Chief Complaint  Patient presents with   Medical Management of Chronic Issues    Patient is being seen for a follow up    HPI: Patient is a 82 y.o. male seen today for medical management of chronic diseases.    [place]   Patient has h/o Type 2 diabetes on insulin  CBGS 70-175 A1C Acceptable  S/p Left Shoulder Arthroplasty per Dr Rennis Chris Pain Controlled with Tylenol Continues to do well Doing his exercises Cognitive impairment Son here and Noticed him to be repeating himself Also Concerned about anxiety and Depression Per son they were worried about Dementia many years ago but it seems he has not progressed since then and he is still managing his finances and Meds He still drives and does his finances Some recent stress due to expense of living in [facility]     BPPV Resolved after therapy Does home Therapy which helps Tinnitus with hearing loss also present Uses Meclizine PRN H/o Lung Nodules  Mostly benign follows with Dr Delton Coombes   H/o Right Shoulder replacement, h/o Lumbar Fusion, Arthritis,  Also h/o PE after shoulder replacement, HLD  He underwent TKA on 05/19/21   Past Medical History:  Diagnosis Date   Arthritis    Complication of anesthesia    Difficult to wake up   Depression    Diabetes mellitus without complication (HCC)    Headache    History of kidney stones    Pulmonary embolism (HCC)    After shoulder surgery   Vertigo     Past Surgical History:  Procedure Laterality Date   ADENOIDECTOMY  Bilateral    FINGER SURGERY     Left middle   FOOT SURGERY Left    laceration/ tendon repair   forehead surgery     L4-L5 fusion     LITHOTRIPSY     Prostrate     Frozen   REVERSE SHOULDER ARTHROPLASTY Left 06/17/2022   Procedure: REVERSE SHOULDER ARTHROPLASTY;  Surgeon: Francena Hanly, MD;  Location: [facility];  Service: Orthopedics;  Laterality: Left;    SHOULDER SURGERY     Complete replacement to right shoulder   TONSILLECTOMY     TRANSURETHRAL RESECTION OF PROSTATE N/A 09/02/2020   Procedure: TRANSURETHRAL RESECTION OF THE PROSTATE (TURP);  Surgeon: Bjorn Pippin, MD;  Location: [facility];  Service: Urology;  Laterality: N/A;    Allergies  Allergen Reactions   Sulfa Antibiotics Swelling    Joint swelling    Outpatient Encounter Medications as of 12/15/2022  Medication Sig   Accu-Chek Softclix Lancets lancets Use to check blood glucose level twice a day E11.40   acetaminophen (TYLENOL) 500 MG tablet Take 500 mg by mouth 2 (two) times daily.   atorvastatin (LIPITOR) 40 MG tablet Take 1 tablet (40 mg total) by mouth daily. E78.2 (Patient taking differently: Take 40 mg by mouth at bedtime. E78.2)   B-D ULTRAFINE III SHORT PEN 31G X 8 MM MISC USE AS DIRECTED  Blood Glucose Monitoring Suppl (ACCU-CHEK GUIDE ME) w/Device KIT 1 kit by Does not apply route daily. DX: E11.40   carbidopa-levodopa (SINEMET CR) 50-200 MG tablet TAKE 1 TABLET BY MOUTH TWICE A DAY   escitalopram (LEXAPRO) 5 MG tablet Take 1 tablet (5 mg total) by mouth daily.   fluticasone (FLONASE) 50 MCG/ACT nasal spray Place 1 spray into both nostrils daily as needed for allergies or rhinitis.   gabapentin (NEURONTIN) 300 MG capsule TAKE TWO CAPSULES IN THE MORNING, TAKE ONE IN THE AFTERNOON AND TAKE TWO AT BEDTIME.   glucose blood (ACCU-CHEK GUIDE) test strip Use to test blood glucose level twice a day DX: E11.40   hydrocortisone cream 1 % Apply 1 Application topically 2 (two) times daily as needed for itching.   Insulin  Glargine (BASAGLAR KWIKPEN) 100 UNIT/ML Inject 35 Units into the skin in the morning. E11.40   meclizine (ANTIVERT) 25 MG tablet Take 1 tablet (25 mg total) by mouth 3 (three) times daily as needed for dizziness.   MULTIPLE MINERALS PO Take 1 tablet by mouth daily.   polyethylene glycol (MIRALAX / GLYCOLAX) 17 g packet Take 17 g by mouth daily.   No facility-administered encounter medications on file as of 12/15/2022.    Review of Systems:  Review of Systems  Constitutional:  Negative for activity change, appetite change and unexpected weight change.  HENT: Negative.    Respiratory:  Negative for cough and shortness of breath.   Cardiovascular:  Negative for leg swelling.  Gastrointestinal:  Negative for constipation.  Genitourinary:  Negative for frequency.  Musculoskeletal:  Positive for arthralgias and gait problem. Negative for myalgias.  Skin: Negative.  Negative for rash.  Neurological:  Negative for dizziness and weakness.  Psychiatric/Behavioral:  Positive for confusion and dysphoric mood. Negative for sleep disturbance. The patient is nervous/anxious.   All other systems reviewed and are negative.   Health Maintenance  Topic Date Due   Zoster Vaccines- Shingrix (1 of 2) Never done   DTaP/Tdap/Td (4 - Tdap) 06/10/2011   Diabetic kidney evaluation - Urine ACR  05/29/2021   Medicare Annual Wellness (AWV)  01/05/2023   COVID-19 Vaccine (8 - 2023-24 season) 01/11/2023   OPHTHALMOLOGY EXAM  03/04/2023   FOOT EXAM  06/02/2023   HEMOGLOBIN A1C  06/06/2023   Diabetic kidney evaluation - eGFR measurement  12/06/2023   Pneumonia Vaccine 67+ Years old  Completed   INFLUENZA VACCINE  Completed   HPV VACCINES  Aged Out    Physical Exam: Vitals:   12/15/22 0836  BP: 130/72  Pulse: 77  Resp: 17  Temp: 97.7 F (36.5 C)  TempSrc: Temporal  SpO2: 95%  Weight: 208 lb (94.3 kg)  Height: 6\' 2"  (1.88 m)   Body mass index is 26.71 kg/m. Physical Exam Vitals reviewed.   Constitutional:      Appearance: Normal appearance.  HENT:     Head: Normocephalic.  Neurological:     General: No focal deficit present.     Mental Status: He is alert and oriented to person, place, and time.  Psychiatric:        Mood and Affect: Mood normal.        Thought Content: Thought content normal.     Labs reviewed: Basic Metabolic Panel: Recent Labs    04/27/22 0708 06/10/22 1333 09/02/22 0753 12/06/22 0000 12/06/22 0800  NA 141 137 139 140 140  K 4.6 4.1 4.2 4.5 4.5  CL 106 104 105 104 104  CO2 29  25 27 31* 31  GLUCOSE 128* 228* 111*  --  131*  BUN 25 25* 19 19 19   CREATININE 0.82 0.94 0.82 0.9 0.92  CALCIUM 9.4 9.0 9.4 9.6 9.6  TSH 1.26  --   --   --   --    Liver Function Tests: Recent Labs    04/27/22 0708 06/10/22 1333 09/02/22 0753  AST 15 21 14   ALT 5* 7 7*  ALKPHOS  --  83  --   BILITOT 0.6 0.6 0.6  PROT 6.8 7.2 6.5  ALBUMIN  --  4.1  --    No results for input(s): "LIPASE", "AMYLASE" in the last 8760 hours. No results for input(s): "AMMONIA" in the last 8760 hours. CBC: Recent Labs    01/07/22 0000 04/27/22 0708 06/10/22 1333 09/02/22 0753  WBC 5.0 3.8 6.6 4.7  NEUTROABS 3,310.00 2,307  --  2,726  HGB 14.7 14.6 14.2 14.3  HCT 43 43.4 43.7 41.9  MCV  --  90.4 93.8 90.7  PLT 226 251 237 271   Lipid Panel: Recent Labs    01/21/22 0749 04/27/22 0708 09/02/22 0753  CHOL 120 113 120  HDL 43 39* 39*  LDLCALC 63 60 67  TRIG 67 61 64  CHOLHDL 2.8 2.9 3.1   Lab Results  Component Value Date   HGBA1C 7.1 (H) 12/06/2022    Procedures since last visit: No results found.  Assessment/Plan 1. Type 2 diabetes mellitus with diabetic neuropathy, with long-term current use of insulin (HCC) A1C acceptable HiS CBGS can vary from 70-170 Tries to follow Diet Does some walking Will Continue same insulin   - Hemoglobin A1c - TSH - Lipid panel - COMPLETE METABOLIC PANEL WITH GFR - CBC with Differential/Platelet  2. Tinnitus of  both ears  - Ambulatory referral to ENT  3. Bilateral hearing loss, unspecified hearing loss type  - Ambulatory referral to ENT  4. Mild episode of recurrent major depressive disorder (HCC) Try Lexapro 5 mg every day     Labs/tests ordered:  * No order type specified * Next appt:  02/10/2023

## 2022-12-20 ENCOUNTER — Encounter (INDEPENDENT_AMBULATORY_CARE_PROVIDER_SITE_OTHER): Payer: Self-pay | Admitting: Otolaryngology

## 2023-01-08 ENCOUNTER — Other Ambulatory Visit: Payer: Self-pay | Admitting: Internal Medicine

## 2023-01-10 NOTE — Telephone Encounter (Signed)
High risk or very high risk warning populated when attempting to refill medication. RX request sent to PCP for review and approval if warranted.

## 2023-01-21 ENCOUNTER — Telehealth: Payer: Self-pay | Admitting: Emergency Medicine

## 2023-01-21 NOTE — Telephone Encounter (Signed)
Patient was unable to have CT Scan due to insurance. Patient now has insurance and needs a CT Scan to be scheduled. A new order may need to be placed. Please call him at [phone]

## 2023-01-25 ENCOUNTER — Encounter (INDEPENDENT_AMBULATORY_CARE_PROVIDER_SITE_OTHER): Payer: Self-pay

## 2023-01-25 ENCOUNTER — Ambulatory Visit (INDEPENDENT_AMBULATORY_CARE_PROVIDER_SITE_OTHER): Payer: 59 | Admitting: Audiology

## 2023-01-25 ENCOUNTER — Ambulatory Visit (INDEPENDENT_AMBULATORY_CARE_PROVIDER_SITE_OTHER): Payer: PRIVATE HEALTH INSURANCE | Admitting: Otolaryngology

## 2023-01-25 VITALS — Ht 71.0 in | Wt 205.0 lb

## 2023-01-25 DIAGNOSIS — H9313 Tinnitus, bilateral: Secondary | ICD-10-CM | POA: Diagnosis not present

## 2023-01-25 DIAGNOSIS — H903 Sensorineural hearing loss, bilateral: Secondary | ICD-10-CM

## 2023-01-25 DIAGNOSIS — H8110 Benign paroxysmal vertigo, unspecified ear: Secondary | ICD-10-CM

## 2023-01-25 NOTE — Progress Notes (Signed)
  [address] [place], [place] [address] [phone]  Audiological Evaluation    Name: Stephen Burns     DOB:   03/28/1940      MRN:   [id number]                                                                                     Service Date: 01/25/2023        Patient was referred today for a hearing evaluation by Dr. Allena Katz.   Symptoms Yes Details  Hearing loss  [x]  Patient reported perceiving hearing loss.  Tinnitus  [x]  Patient reported experiencing tinnitus.  Balance problems   [x]  Patient reported consistent vertigo sensations.  Noise exposure  [x]  Patient reported a history of noise exposure with the [employer] and heavy equipment.  Previous ear surgeries  []  Patient denied any previous ear surgeries.  Family history  []  Patient denied family history of hearing loss.  Amplification  []  Patient denied the use of hearing aids.    Otoscopy: Right ear: Non-occluding cerumen, able to visualize notable tympanic membrane landmarks Left ear:  Clear external ear canals and notable landmarks visualized on the tympanic membrane.  Tympanogram: Right ear: Normal external ear canal volume with normal middle ear pressure and high tympanic membrane compliance (Type Ad). Left ear: Normal external ear canal volume with normal middle ear pressure and tympanic membrane compliance (Type A).    Hearing Evaluation: The audiogram was completed using conventional audiometric techniques under headphones with good reliability.   The hearing test results indicate: Right ear: Normal hearing sensitivity from [phone] sloping to severe sensorineural hearing loss from 4000-8000 Hz. Left ear: Normal hearing sensitivity from [phone] sloping to severe sensorineural hearing loss from 4000-8000 Hz.  Speech Audiometry: Right ear- Speech Reception Threshold (SRT) was obtained at 20 dBHL. Left ear- Speech Reception Threshold (SRT) was obtained at 20 dBHL.   Word Recognition Score Tested  using NU-6 (MLV) Right ear: 100% was obtained at a presentation level of 60 dBHL which is deemed as excellent understanding. Left ear: 100% was obtained at a presentation level of 60 dBHL which is deemed as excellent understanding.    Impression:  There is not a significant difference between puretone thresholds and word recognition scores between ears.   Recommendations: Repeat audiogram when changes are perceived or per MD. Consider various tinnitus strategies, including the use of a noise generator, hearing aids, or tinnitus retraining therapy.   Conley Rolls Jefte Carithers, AUD, CCC-A 01/25/23

## 2023-01-25 NOTE — Progress Notes (Signed)
Dear Dr. Chales Abrahams, Here is my assessment for our mutual patient, Stephen Burns. Thank you for allowing me the opportunity to care for your patient. Please do not hesitate to contact me should you have any other questions. Sincerely, Dr. Jovita Kussmaul  Otolaryngology Clinic Note Referring provider: Dr. Chales Abrahams HPI:  Stephen Burns is a 82 y.o. male kindly referred by Dr. Chales Abrahams for evaluation of bilateral tinnitus and hearing loss  Patient reports: bilateral tinnitus for "a long time", steady hissing sound, not pulsatile. He reports that it is most noticeable quiet environments, background noise helps. Has not tried anything for it. Hearing is ok, no significant decline or decline in QOL from this standpoint. He feels like he is popping his ears intermittently He does report intermittent vertigo with head movement, and Epley helps a lot - essentially resolves it. Has not had it for a year. Uses meclizine PRN. Was seen in ED for this in Nov 2023 and got PRN therapy in [place] which helps. 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 Lives in [place], [place]  Independent Review of Additional Tests or Records:  Dr. Urban Gibson (referral) notes reviewed (12/15/2022, 03/2022 and 05/2022): BPPV, resolved after therapy; tinnitus w/HL present, uses meclizine PRN; referred to ENT for eval 12/16/2021 Dr. Chales Abrahams: dx with BPPV given symptoms resolved. HL - referred to ENT at that point initially [facility] 12/13/2021 and ED visit for dizziness:  no rx except meclizine CT independent review shows well aerated middle ears and mastoids b/l; cuts thick but no obvious otic capsule abnormalities CBC 04/27/2022: wnl, TSH 04/27/2022: wnl.   01/25/2023 Audiogram was independently reviewed and interpreted by me and it reveals AU: normal downsloping to severe SNHL; WRT 100% at 60dB; symmetric; A/A tymps (AD maybe Ad)   SNHL= Sensorineural hearing loss   PMH/Meds/All/SocHx/FamHx/ROS:   Past Medical History:  Diagnosis Date   Arthritis    Complication of anesthesia    Difficult to wake up   Depression    Diabetes mellitus without complication (HCC)    Headache    History of kidney stones    Pulmonary embolism (HCC)    After shoulder surgery   Vertigo      Past Surgical History:  Procedure Laterality Date   ADENOIDECTOMY Bilateral    FINGER SURGERY     Left middle   FOOT SURGERY Left    laceration/ tendon repair   forehead surgery     L4-L5 fusion     LITHOTRIPSY     Prostrate     Frozen   REVERSE SHOULDER ARTHROPLASTY Left 06/17/2022   Procedure: REVERSE SHOULDER ARTHROPLASTY;  Surgeon: Francena Hanly, MD;  Location: [facility];  Service: Orthopedics;  Laterality: Left;    SHOULDER SURGERY     Complete replacement to right shoulder   TONSILLECTOMY     TRANSURETHRAL RESECTION OF PROSTATE N/A 09/02/2020   Procedure: TRANSURETHRAL RESECTION OF THE PROSTATE (TURP);  Surgeon: Bjorn Pippin, MD;  Location: [facility];  Service: Urology;  Laterality: N/A;    History reviewed. No pertinent family history.   Social  Connections: Moderately Integrated (01/04/2022)   Social Connection and Isolation Panel [NHANES]    Frequency of Communication with Friends and Family: More than three times a week    Frequency of Social Gatherings with Friends and Family: More than three times a week    Attends Religious Services: More than 4 times per year    Active Member of [name] or Organizations: No    Attends Engineer, structural: Never     Marital Status: Married      Current Outpatient Medications:    Accu-Chek Softclix Lancets lancets, Use to check blood glucose level twice a day E11.40, Disp: 100 each, Rfl: 12   acetaminophen (TYLENOL) 500 MG tablet, Take 500 mg by mouth 2 (two) times daily., Disp: , Rfl:    atorvastatin (LIPITOR) 40 MG tablet, Take 1 tablet (40 mg total) by mouth daily. E78.2 (Patient taking differently: Take 40 mg by mouth at bedtime. E78.2), Disp: 90 tablet, Rfl: 3   B-D ULTRAFINE III SHORT PEN 31G X 8 MM MISC, USE AS DIRECTED, Disp: 100 each, Rfl: 3   Blood Glucose Monitoring Suppl (ACCU-CHEK GUIDE ME) w/Device KIT, 1 kit by Does not apply route daily. DX: E11.40, Disp: 1 kit, Rfl: 0   carbidopa-levodopa (SINEMET CR) 50-200 MG tablet, TAKE 1 TABLET BY MOUTH TWICE A DAY, Disp: 180 tablet, Rfl: 1   escitalopram (LEXAPRO) 5 MG tablet, TAKE 1 TABLET (5 MG TOTAL) BY MOUTH DAILY., Disp: 90 tablet, Rfl: 1   fluticasone (FLONASE) 50 MCG/ACT nasal spray, Place 1 spray into both nostrils daily as needed for allergies or rhinitis., Disp: , Rfl:    gabapentin (NEURONTIN) 300 MG capsule, TAKE TWO CAPSULES IN THE MORNING, TAKE ONE IN THE AFTERNOON AND TAKE TWO AT BEDTIME., Disp: 450 capsule, Rfl: 1   glucose blood (ACCU-CHEK GUIDE) test strip, Use to test blood glucose level twice a day DX: E11.40, Disp: 100 each, Rfl: 12   hydrocortisone cream 1 %, Apply 1 Application topically 2 (two) times daily as needed for itching., Disp: , Rfl:    Insulin Glargine (BASAGLAR KWIKPEN) 100 UNIT/ML, Inject 35 Units into the skin in the morning. E11.40, Disp: 15 mL, Rfl: 5   meclizine (ANTIVERT) 25 MG tablet, Take 1 tablet (25 mg total) by mouth 3 (three) times daily as needed for dizziness., Disp: 30 tablet, Rfl: 0   MULTIPLE MINERALS PO, Take 1 tablet by mouth daily., Disp: , Rfl:    polyethylene glycol (MIRALAX / GLYCOLAX) 17 g packet, Take 17 g by mouth daily., Disp: , Rfl:    Physical Exam:   Ht 5\' 11"  (1.803 m)   Wt 205 lb (93  kg)   BMI 28.59 kg/m   Salient findings:  CN II-XII intact; no gross nystagmus  Bilateral EAC clear and TM intact with well pneumatized middle ear spaces Weber 512: midline Rinne 512: AC > BC b/l  Anterior rhinoscopy: Septum relatively midline; bilateral inferior turbinates without significant hypertrophy No lesions of oral cavity/oropharynx No obviously palpable neck masses/lymphadenopathy/thyromegaly No respiratory distress or stridor  Seprately Identifiable Procedures:  None  Impression & Plans:  Stephen Burns is a 82 y.o. male with   1. Bilateral sensorineural hearing loss   2. Bilateral tinnitus   3. Benign paroxysmal positional vertigo, unspecified laterality    Bilateral non-pulsatile tinnitus with downsloping HL in setting of noise exposure; Exam reassuring. 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 Observation 2. Repeat HT intervally 3. Masking 4. HA (likely may not help significantly,  at least with hearing) 5. Further imaging - but has symmetric HL Prior CT without significant pathology BPPV - resolved, no other symptoms after Epley  Discussed nature of tinnitus and options as above. Candidate for HA but he'd like to avoid After discussion of options, he opted for observation. Discussed f/u and offered repeat audio, will follow up should things change or worsen - PRN Can try flavonoids  See below regarding exact medications prescribed this encounter including dosages and route: No orders of the defined types were placed in this encounter.     Thank you for allowing me the opportunity to care for your patient. Please do not hesitate to contact me should you have any other questions.  Sincerely, Jovita Kussmaul, MD Otolarynoglogist (ENT), [facility] Phone: [phone] Fax: [phone]  01/25/2023, 10:30 AM   MDM:  Level 4 - [phone] Complexity/Problems addressed: multiple chronic problems, stable Data complexity: mod - independent review  of CT - Morbidity: low  - Prescription Drug prescribed or managed: no

## 2023-01-28 ENCOUNTER — Ambulatory Visit (HOSPITAL_COMMUNITY)
Admission: RE | Admit: 2023-01-28 | Discharge: 2023-01-28 | Disposition: A | Discharge status: Home / Self Care | Payer: Medicare HMO | Source: Ambulatory Visit | Attending: Emergency Medicine | Admitting: Emergency Medicine

## 2023-01-28 DIAGNOSIS — R911 Solitary pulmonary nodule: Secondary | ICD-10-CM | POA: Diagnosis not present

## 2023-02-01 ENCOUNTER — Encounter: Payer: Self-pay | Admitting: Audiology

## 2023-02-10 ENCOUNTER — Other Ambulatory Visit: Payer: PRIVATE HEALTH INSURANCE

## 2023-02-11 LAB — CBC WITH DIFFERENTIAL/PLATELET
Absolute Lymphocytes: 1170 {cells}/uL (ref 850–3900)
Absolute Monocytes: 520 {cells}/uL (ref 200–950)
Basophils Absolute: 30 {cells}/uL (ref 0–200)
Basophils Relative: 0.6 %
Eosinophils Absolute: 90 {cells}/uL (ref 15–500)
Eosinophils Relative: 1.8 %
HCT: 46 % (ref 38.5–50.0)
Hemoglobin: 15.6 g/dL (ref 13.2–17.1)
MCH: 31.5 pg (ref 27.0–33.0)
MCHC: 33.9 g/dL (ref 32.0–36.0)
MCV: 92.7 fL (ref 80.0–100.0)
MPV: 10.6 fL (ref 7.5–12.5)
Monocytes Relative: 10.4 %
Neutro Abs: 3190 {cells}/uL (ref 1500–7800)
Neutrophils Relative %: 63.8 %
Platelets: 226 10*3/uL (ref 140–400)
RBC: 4.96 10*6/uL (ref 4.20–5.80)
RDW: 12.4 % (ref 11.0–15.0)
Total Lymphocyte: 23.4 %
WBC: 5 10*3/uL (ref 3.8–10.8)

## 2023-02-11 LAB — COMPLETE METABOLIC PANEL WITH GFR
AG Ratio: 1.7 (calc) (ref 1.0–2.5)
ALT: 9 U/L (ref 9–46)
AST: 20 U/L (ref 10–35)
Albumin: 4.3 g/dL (ref 3.6–5.1)
Alkaline phosphatase (APISO): 91 U/L (ref 35–144)
BUN: 22 mg/dL (ref 7–25)
CO2: 27 mmol/L (ref 20–32)
Calcium: 9.4 mg/dL (ref 8.6–10.3)
Chloride: 104 mmol/L (ref 98–110)
Creat: 0.86 mg/dL (ref 0.70–1.22)
Globulin: 2.6 g/dL (ref 1.9–3.7)
Glucose, Bld: 106 mg/dL — ABNORMAL HIGH (ref 65–99)
Potassium: 4.6 mmol/L (ref 3.5–5.3)
Sodium: 137 mmol/L (ref 135–146)
Total Bilirubin: 0.9 mg/dL (ref 0.2–1.2)
Total Protein: 6.9 g/dL (ref 6.1–8.1)
eGFR: 87 mL/min/{1.73_m2} (ref >=60)

## 2023-02-11 LAB — LIPID PANEL
Cholesterol: 127 mg/dL (ref <200)
HDL: 42 mg/dL (ref >=40)
LDL Cholesterol (Calc): 69 mg/dL
Non-HDL Cholesterol (Calc): 85 mg/dL (ref <130)
Total CHOL/HDL Ratio: 3 (calc) (ref <5.0)
Triglycerides: 81 mg/dL (ref <150)

## 2023-02-11 LAB — TSH: TSH: 1.57 m[IU]/L (ref 0.40–4.50)

## 2023-02-11 LAB — HEMOGLOBIN A1C
Hgb A1c MFr Bld: 7.3 %{Hb} — ABNORMAL HIGH (ref <5.7)
Mean Plasma Glucose: 163 mg/dL
eAG (mmol/L): 9 mmol/L

## 2023-02-16 ENCOUNTER — Other Ambulatory Visit: Payer: Self-pay | Admitting: Internal Medicine

## 2023-02-16 ENCOUNTER — Non-Acute Institutional Stay: Payer: Medicare HMO | Admitting: Internal Medicine

## 2023-02-16 ENCOUNTER — Encounter: Payer: Self-pay | Admitting: Internal Medicine

## 2023-02-16 VITALS — BP 128/78 | HR 75 | Temp 97.9°F | Resp 18 | Ht 71.0 in | Wt 204.0 lb

## 2023-02-16 DIAGNOSIS — E1140 Type 2 diabetes mellitus with diabetic neuropathy, unspecified: Secondary | ICD-10-CM | POA: Diagnosis not present

## 2023-02-16 DIAGNOSIS — B351 Tinea unguium: Secondary | ICD-10-CM | POA: Diagnosis not present

## 2023-02-16 DIAGNOSIS — E782 Mixed hyperlipidemia: Secondary | ICD-10-CM

## 2023-02-16 DIAGNOSIS — R197 Diarrhea, unspecified: Secondary | ICD-10-CM

## 2023-02-16 DIAGNOSIS — Z794 Long term (current) use of insulin: Secondary | ICD-10-CM

## 2023-02-16 DIAGNOSIS — G4762 Sleep related leg cramps: Secondary | ICD-10-CM

## 2023-02-16 DIAGNOSIS — H9193 Unspecified hearing loss, bilateral: Secondary | ICD-10-CM | POA: Diagnosis not present

## 2023-02-16 DIAGNOSIS — F330 Major depressive disorder, recurrent, mild: Secondary | ICD-10-CM | POA: Diagnosis not present

## 2023-02-16 DIAGNOSIS — H8113 Benign paroxysmal vertigo, bilateral: Secondary | ICD-10-CM | POA: Diagnosis not present

## 2023-02-16 MED ORDER — ESCITALOPRAM OXALATE 10 MG PO TABS: 10.0000 mg | ORAL_TABLET | Freq: Every day | ORAL | 3 refills | Status: DC

## 2023-02-16 MED ORDER — CICLOPIROX 8 % EX SOLN: Freq: Every day | CUTANEOUS | 0 refills | Status: DC

## 2023-02-16 NOTE — Telephone Encounter (Signed)
Pharmacy comment: Alternative Requested:PRIOR AUTH REQUIRED

## 2023-02-16 NOTE — Progress Notes (Signed)
 Location:  [facility]:  SNF ([id number])  Provider:   Code Status:  Goals of Care:     02/16/2023    1:36 PM  Advanced Directives  Does Patient Have a Medical Advance Directive? No  Would patient like information on creating a medical advance directive? No - Patient declined     Chief Complaint  Patient presents with   Medical Management of Chronic Issues    2 month follow up with labs   Immunizations    Shingrix, Covid and DTAP   Health Maintenance    Medicare Annual Wellness Visit and Diabetic Kidney Evaluation-Urine ACR    HPI: Patient is a 83 y.o. male seen today for medical management of chronic diseases.   [place] in [place]  Discussed the use of AI scribe software for clinical note transcription with the patient, who gave verbal consent to proceed.  History of Present Illness   Patient has h/o Type 2 diabetes on insulin   CBGS 70-175 A1C is staying good   S/p Left Shoulder Arthroplasty per Dr Melita Pain Controlled with Tylenol  Continues to do well Doing his exercises Has Good range of Motion  Cognitive impairment Son here and Noticed him to be repeating himself Also Concerned about anxiety and Depression He still drives and does his finances Some recent stress due to expense of living in [facility]   Feels Like Lexapro  has helped   BPPV Resolved after therapy Does home Therapy which helps Tinnitus with hearing loss also present Seen By ENT Think it is permanent and nothing more can be done H/o Lung Nodules  Mostly benign follows with Dr Shelah Repeat CT was negative    Diarrhea  They also report a recent pattern of few episodes of diarrhea for the past three weeks,Resolved it self Does take Mirialax every day No Abdominal Pain No red flags  The patient also reports a fungal infection under the big toe of their right foot, which has not improved with over-the-counter antifungal treatmen  H/o Right Shoulder  replacement, h/o Lumbar Fusion, Arthritis,  Also h/o PE after shoulder replacement, HLD  He underwent TKA on 05/19/21       Past Medical History:  Diagnosis Date   Arthritis    Complication of anesthesia    Difficult to wake up   Depression    Diabetes mellitus without complication (HCC)    Headache    History of kidney stones    Pulmonary embolism (HCC)    After shoulder surgery   Vertigo     Past Surgical History:  Procedure Laterality Date   ADENOIDECTOMY Bilateral    FINGER SURGERY     Left middle   FOOT SURGERY Left    laceration/ tendon repair   forehead surgery     L4-L5 fusion     LITHOTRIPSY     Prostrate     Frozen   REVERSE SHOULDER ARTHROPLASTY Left 06/17/2022   Procedure: REVERSE SHOULDER ARTHROPLASTY;  Surgeon: Melita Drivers, MD;  Location: [facility];  Service: Orthopedics;  Laterality: Left;    SHOULDER SURGERY     Complete replacement to right shoulder   TONSILLECTOMY     TRANSURETHRAL RESECTION OF PROSTATE N/A 09/02/2020   Procedure: TRANSURETHRAL RESECTION OF THE PROSTATE (TURP);  Surgeon: Watt Rush, MD;  Location: [facility];  Service: Urology;  Laterality: N/A;    Allergies  Allergen Reactions   Sulfa Antibiotics Swelling  Joint swelling    Outpatient Encounter Medications as of 02/16/2023  Medication Sig   Accu-Chek Softclix Lancets lancets Use to check blood glucose level twice a day E11.40   acetaminophen  (TYLENOL ) 500 MG tablet Take 500 mg by mouth 2 (two) times daily.   atorvastatin  (LIPITOR) 40 MG tablet Take 1 tablet (40 mg total) by mouth daily. E78.2 (Patient taking differently: Take 40 mg by mouth at bedtime. E78.2)   B-D ULTRAFINE III SHORT PEN 31G X 8 MM MISC USE AS DIRECTED   Blood Glucose Monitoring Suppl (ACCU-CHEK GUIDE ME) w/Device KIT 1 kit by Does not apply route daily. DX: E11.40   carbidopa -levodopa  (SINEMET  CR) 50-200 MG tablet TAKE 1 TABLET BY MOUTH TWICE A DAY   ciclopirox  (CICLODAN ) 8 % solution Apply topically at  bedtime. Apply over nail and surrounding skin. Apply daily over previous coat. After seven (7) days, may remove with alcohol and continue cycle.   escitalopram  (LEXAPRO ) 10 MG tablet Take 1 tablet (10 mg total) by mouth daily.   fluticasone  (FLONASE ) 50 MCG/ACT nasal spray Place 1 spray into both nostrils daily as needed for allergies or rhinitis.   gabapentin  (NEURONTIN ) 300 MG capsule TAKE TWO CAPSULES IN THE MORNING, TAKE ONE IN THE AFTERNOON AND TAKE TWO AT BEDTIME.   glucose blood (ACCU-CHEK GUIDE) test strip Use to test blood glucose level twice a day DX: E11.40   hydrocortisone cream 1 % Apply 1 Application topically 2 (two) times daily as needed for itching.   Insulin  Glargine (BASAGLAR  KWIKPEN) 100 UNIT/ML Inject 35 Units into the skin in the morning. E11.40   meclizine  (ANTIVERT ) 25 MG tablet Take 1 tablet (25 mg total) by mouth 3 (three) times daily as needed for dizziness.   MULTIPLE MINERALS PO Take 1 tablet by mouth daily.   polyethylene glycol (MIRALAX  / GLYCOLAX ) 17 g packet Take 17 g by mouth daily.   [DISCONTINUED] escitalopram  (LEXAPRO ) 5 MG tablet TAKE 1 TABLET (5 MG TOTAL) BY MOUTH DAILY.   No facility-administered encounter medications on file as of 02/16/2023.    Review of Systems:  Review of Systems  Constitutional:  Negative for activity change, appetite change and unexpected weight change.  HENT: Negative.    Respiratory:  Negative for cough and shortness of breath.   Cardiovascular:  Negative for leg swelling.  Gastrointestinal:  Positive for constipation and diarrhea.  Genitourinary:  Positive for frequency.  Musculoskeletal:  Positive for arthralgias. Negative for gait problem and myalgias.  Skin: Negative.  Negative for rash.  Neurological:  Negative for dizziness and weakness.  Psychiatric/Behavioral:  Negative for confusion and sleep disturbance. The patient is nervous/anxious.   All other systems reviewed and are negative.   Health Maintenance  Topic Date  Due   Zoster Vaccines- Shingrix (1 of 2) Never done   DTaP/Tdap/Td (4 - Tdap) 06/10/2011   Diabetic kidney evaluation - Urine ACR  05/29/2021   Medicare Annual Wellness (AWV)  01/05/2023   COVID-19 Vaccine (8 - 2024-25 season) 01/11/2023   OPHTHALMOLOGY EXAM  03/04/2023   FOOT EXAM  06/02/2023   HEMOGLOBIN A1C  08/10/2023   Diabetic kidney evaluation - eGFR measurement  02/10/2024   Pneumonia Vaccine 64+ Years old  Completed   INFLUENZA VACCINE  Completed   HPV VACCINES  Aged Out    Physical Exam: Vitals:   02/16/23 1348  BP: 128/78  Pulse: 75  Resp: 18  Temp: 97.9 F (36.6 C)  SpO2: 96%  Weight: 204 lb (92.5 kg)  Height: 5' 11 (1.803 m)   Body mass index is 28.45 kg/m. Physical Exam Vitals reviewed.  Constitutional:      Appearance: Normal appearance.  HENT:     Head: Normocephalic.     Nose: Nose normal.     Mouth/Throat:     Mouth: Mucous membranes are moist.     Pharynx: Oropharynx is clear.  Eyes:     Pupils: Pupils are equal, round, and reactive to light.  Cardiovascular:     Rate and Rhythm: Normal rate and regular rhythm.     Pulses: Normal pulses.     Heart sounds: No murmur heard. Pulmonary:     Effort: Pulmonary effort is normal. No respiratory distress.     Breath sounds: Normal breath sounds. No rales.  Abdominal:     General: Abdomen is flat. Bowel sounds are normal.     Palpations: Abdomen is soft.  Musculoskeletal:        General: No swelling.     Cervical back: Neck supple.  Skin:    General: Skin is warm.  Neurological:     General: No focal deficit present.     Mental Status: He is alert and oriented to person, place, and time.  Psychiatric:        Mood and Affect: Mood normal.        Thought Content: Thought content normal.     Labs reviewed: Basic Metabolic Panel: Recent Labs    04/27/22 0708 06/10/22 1333 09/02/22 0753 12/06/22 0000 12/06/22 0800 02/10/23 0818  NA 141   < > 139 140 140 137  K 4.6   < > 4.2 4.5 4.5 4.6   CL 106   < > 105 104 104 104  CO2 29   < > 27 31* 31 27  GLUCOSE 128*   < > 111*  --  131* 106*  BUN 25   < > 19 19 19 22   CREATININE 0.82   < > 0.82 0.9 0.92 0.86  CALCIUM  9.4   < > 9.4 9.6 9.6 9.4  TSH 1.26  --   --   --   --  1.57   < > = values in this interval not displayed.   Liver Function Tests: Recent Labs    06/10/22 1333 09/02/22 0753 02/10/23 0818  AST 21 14 20   ALT 7 7* 9  ALKPHOS 83  --   --   BILITOT 0.6 0.6 0.9  PROT 7.2 6.5 6.9  ALBUMIN 4.1  --   --    No results for input(s): LIPASE, AMYLASE in the last 8760 hours. No results for input(s): AMMONIA in the last 8760 hours. CBC: Recent Labs    04/27/22 0708 06/10/22 1333 09/02/22 0753 02/10/23 0818  WBC 3.8 6.6 4.7 5.0  NEUTROABS 2,307  --  2,726 3,190  HGB 14.6 14.2 14.3 15.6  HCT 43.4 43.7 41.9 46.0  MCV 90.4 93.8 90.7 92.7  PLT 251 237 271 226   Lipid Panel: Recent Labs    04/27/22 0708 09/02/22 0753 02/10/23 0818  CHOL 113 120 127  HDL 39* 39* 42  LDLCALC 60 67 69  TRIG 61 64 81  CHOLHDL 2.9 3.1 3.0   Lab Results  Component Value Date   HGBA1C 7.3 (H) 02/10/2023    Procedures since last visit: CT Super D Chest Wo Contrast Result Date: 02/05/2023 CLINICAL DATA:  Lung nodule follow-up. EXAM: CT CHEST WITHOUT CONTRAST TECHNIQUE: Multidetector CT imaging of the chest was performed  using thin slice collimation for electromagnetic bronchoscopy planning purposes, without intravenous contrast. RADIATION DOSE REDUCTION: This exam was performed according to the departmental dose-optimization program which includes automated exposure control, adjustment of the mA and/or kV according to patient size and/or use of iterative reconstruction technique. COMPARISON:  CT angiography chest report from 05/20/2021. Please note images are not available for review at the time of dictation. FINDINGS: Cardiovascular: Normal cardiac size. No pericardial effusion. No aortic aneurysm. There are coronary artery  calcifications, in keeping with coronary artery disease. There are also moderate peripheral atherosclerotic vascular calcifications of thoracic aorta and its major branches. Mediastinum/Nodes: Visualized thyroid  gland appears grossly unremarkable. No solid / cystic mediastinal masses. The esophagus is nondistended precluding optimal assessment. No mediastinal or axillary lymphadenopathy by size criteria. Evaluation of bilateral hila is limited due to lack on intravenous contrast: however, no large hilar lymphadenopathy identified. Lungs/Pleura: The central tracheo-bronchial tree is patent. There are patchy areas of linear, plate-like atelectasis and/or scarring throughout bilateral lungs. No mass or consolidation. No pleural effusion or pneumothorax. There is a partially calcified nodule in the right lung lower lobe, posteromedially measuring 2.5 x 2.7 cm. This likely corresponds to the nodule described on the prior exam and there is no significant interval change in the size, favoring benign etiology. There is a 2 mm calcified granuloma in the left lung apex. No suspicious lung nodules. Upper Abdomen: There is a single 5 mm calcified gallstone in the gallbladder without imaging signs of acute cholecystitis. There is a 8 x 10 mm hypoattenuating area in the caudate lobe, favored to represent a cyst. Remaining visualized upper abdominal viscera within normal limits. Musculoskeletal: The visualized soft tissues of the chest wall are grossly unremarkable. No suspicious osseous lesions. There are mild to moderate multilevel degenerative changes in the visualized spine. Bilateral shoulder arthroplasty noted. IMPRESSION: *There is a partially calcified 2.5 x 2.7 cm nodule in the right lung lower lobe, favored benign. No new or suspicious lung nodules. *No lung mass, consolidation, pleural effusion or pneumothorax. *Other nonacute observations, as described above. Aortic Atherosclerosis (ICD10-I70.0). Electronically Signed    By: Ree Molt M.D.   On: 02/05/2023 12:27    Assessment/Plan 1. Type 2 diabetes mellitus with diabetic neuropathy, with long-term current use of insulin  (HCC) (Primary) CBGS 70-180 Discussed diet and exercise. - Ambulatory referral to Podiatry  2. Mild episode of recurrent major depressive disorder (HCC) Improvement noted by family members with current medication. Discussed increasing dose due to ongoing stressors. -Increase Lexapro  to 10mg  daily.  3. Onychomycosis Ciclopirox  - Ambulatory referral to Podiatry  4. Bilateral hearing loss, unspecified hearing loss type Seen By ENT  High frequency hearing loss likely secondary to occupational noise exposure. No intervention planned. -Continue current management.  5. Mixed hyperlipidemia Statin Doing well  6. Nocturnal leg cramps Sinemet   7. BPPV (benign paroxysmal positional vertigo), bilateral Meclizine  Prn therapy   8 Diarrhea Weekly episodes of diarrhea for the past 3 weeks. Possible relation to Miralax  use. -Adjust Miralax  dosing:alternate days instead of daily. -Encourage patient to monitor diet and Miralax  use in relation to diarrhea episodes.  General Health Maintenance -Ensure all vaccinations are up to date. -Follow-up in 3.5 months.        Labs/tests ordered:  * No order type specified * Next appt:  05/18/2023

## 2023-02-22 DIAGNOSIS — M65332 Trigger finger, left middle finger: Secondary | ICD-10-CM | POA: Diagnosis not present

## 2023-03-11 ENCOUNTER — Other Ambulatory Visit: Payer: Self-pay | Admitting: Internal Medicine

## 2023-03-11 NOTE — Telephone Encounter (Signed)
Pharmacy is requesting refill for Lexapro.  Lexapro is not in patient's current medication list but last OV note states he is to Increase his Lexapro to 10mg  Daily.  Is it ok to add medication to current medication list? Please Advise.    OV Note Dated 02/16/2023: 2. Mild episode of recurrent major depressive disorder (HCC) Improvement noted by family members with current medication. Discussed increasing dose due to ongoing stressors. -Increase Lexapro to 10mg  daily.

## 2023-03-17 ENCOUNTER — Non-Acute Institutional Stay: Payer: Medicare HMO | Admitting: Nurse Practitioner

## 2023-03-17 ENCOUNTER — Encounter: Payer: Self-pay | Admitting: Nurse Practitioner

## 2023-03-17 VITALS — BP 126/76 | HR 68 | Temp 98.0°F | Resp 19 | Ht 71.0 in | Wt 211.0 lb

## 2023-03-17 DIAGNOSIS — R252 Cramp and spasm: Secondary | ICD-10-CM | POA: Diagnosis not present

## 2023-03-17 DIAGNOSIS — E1140 Type 2 diabetes mellitus with diabetic neuropathy, unspecified: Secondary | ICD-10-CM

## 2023-03-17 DIAGNOSIS — F339 Major depressive disorder, recurrent, unspecified: Secondary | ICD-10-CM | POA: Diagnosis not present

## 2023-03-17 DIAGNOSIS — Z794 Long term (current) use of insulin: Secondary | ICD-10-CM | POA: Diagnosis not present

## 2023-03-17 NOTE — Progress Notes (Signed)
 Location:   Clinic [facility]   Place of Service:  Clinic (12) Provider: [facility] Stephen Vereen NP  [name], MD  Patient Care Team: [name], MD as PCP - General (Internal Medicine)  Extended Emergency Contact Information Primary Emergency Contact: Stephen,Burns Mobile Phone: [phone] Relation: Son Secondary Emergency Contact: [facility] Address: [address]          [place], [place] [address] [place] Home Phone: [phone] Mobile Phone: [phone] Relation: Spouse Preferred language: English Interpreter needed? No  Code Status: DNR Goals of care: Advanced Directive information    03/17/2023    2:44 PM  Advanced Directives  Does Patient Have a Medical Advance Directive? No  Would patient like information on creating a medical advance directive? No - Patient declined     Chief Complaint  Patient presents with   Acute Visit    Acute pain in left calf area.    HPI:  Pt is a 83 y.o. male seen today for an acute visit for resolved shooting pain in the left calf a couple of times, short lived in nature and resolved w/o intervention. No pain in the left calf palpated or with left foot dorsiflexion. No redness, warmth, or swelling in LLE noted. There was bruise mid left shin from pumped into the door of his car is healing.   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              BPH, had urology evaluation, monitor PSA, off Finasteride , Tamsulosin . 09/02/20 Dr. 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 Knee pain. Seeing Ortho                 Past Medical History:  Diagnosis Date   Arthritis    Complication of anesthesia    Difficult to wake up   Depression    Diabetes mellitus without complication (HCC)    Headache    History of kidney stones    Pulmonary embolism (HCC)    After shoulder surgery   Vertigo    Past Surgical History:  Procedure Laterality Date   ADENOIDECTOMY Bilateral    FINGER SURGERY     Left middle   FOOT SURGERY Left    laceration/ tendon repair  forehead surgery     L4-L5 fusion     LITHOTRIPSY     Prostrate     Frozen   REVERSE SHOULDER ARTHROPLASTY Left 06/17/2022   Procedure: REVERSE SHOULDER ARTHROPLASTY;  Surgeon: Melita Drivers, MD;  Location: [facility];  Service: Orthopedics;  Laterality: Left;    SHOULDER SURGERY     Complete replacement to right shoulder   TONSILLECTOMY     TRANSURETHRAL RESECTION OF PROSTATE N/A 09/02/2020   Procedure: TRANSURETHRAL RESECTION OF THE PROSTATE (TURP);  Surgeon: Watt Rush, MD;  Location: [facility];  Service: Urology;  Laterality: N/A;    Allergies  Allergen Reactions   Sulfa Antibiotics Swelling    Joint swelling    Allergies as of 03/17/2023       Reactions   Sulfa Antibiotics Swelling   Joint swelling        Medication List        Accurate as of March 17, 2023 11:59 PM. If you have any questions, ask your nurse or doctor.          Accu-Chek Guide Me w/Device Kit 1 kit by Does not apply route daily. DX: E11.40   Accu-Chek Guide test strip Generic drug: glucose blood Use to test blood glucose level twice a day DX: E11.40   Accu-Chek Softclix Lancets lancets Use to check blood glucose level  twice a day E11.40   acetaminophen  500 MG tablet Commonly known as: TYLENOL  Take 500 mg by mouth 2 (two) times daily.   atorvastatin  40 MG tablet Commonly known as: LIPITOR Take 1 tablet (40 mg total) by mouth daily. E78.2 What changed: when to take this   B-D ULTRAFINE III SHORT PEN 31G X 8 MM Misc Generic drug: Insulin  Pen Needle USE AS DIRECTED   Basaglar  KwikPen 100 UNIT/ML Inject 35 Units into the skin in the morning. E11.40   carbidopa -levodopa  50-200 MG tablet Commonly known as: SINEMET  CR TAKE 1 TABLET BY MOUTH TWICE A DAY   ciclopirox  8 % solution Commonly known as: Ciclodan  Apply topically at bedtime. Apply over nail and surrounding skin. Apply daily over previous coat. After seven (7) days, may remove with alcohol and continue cycle.   citalopram 20 MG tablet Commonly known as: CELEXA Take 20 mg by mouth daily.   escitalopram  10 MG tablet Commonly known as: LEXAPRO  TAKE 1 TABLET BY MOUTH EVERY DAY   fluticasone  50 MCG/ACT nasal spray Commonly known as: FLONASE  Place 1 spray into both nostrils daily as needed for allergies or rhinitis.   gabapentin  300 MG capsule Commonly known as: NEURONTIN  TAKE TWO CAPSULES IN THE MORNING, TAKE ONE IN THE AFTERNOON AND TAKE TWO AT BEDTIME.   hydrocortisone cream 1 % Apply 1 Application topically 2 (two) times daily as needed for itching.   meclizine  25 MG tablet Commonly known as: ANTIVERT  Take 1 tablet (25 mg total) by mouth 3 (three) times daily as needed for dizziness.   MULTIPLE MINERALS PO Take 1 tablet by mouth daily.   polyethylene glycol 17 g packet Commonly known as: MIRALAX  / GLYCOLAX  Take 17 g by mouth daily.        Review of Systems  Constitutional:  Negative for activity change, fatigue and fever.  HENT:  Positive for hearing loss. Negative for congestion, rhinorrhea, sore throat and trouble swallowing.   Eyes:  Negative for visual disturbance.  Respiratory:  Negative for cough, shortness of  breath and wheezing.   Cardiovascular:  Negative for chest pain, palpitations and leg swelling.  Gastrointestinal:  Negative for abdominal pain and constipation.  Genitourinary:  Positive for frequency. Negative for difficulty urinating and urgency.       3x/night  Musculoskeletal:  Positive for arthralgias and gait problem.  Skin:  Negative for color change.       Healing contusion mid of the left shin  Neurological:  Positive for numbness. Negative for tremors, speech difficulty and headaches.  Psychiatric/Behavioral:  Positive for sleep disturbance. Negative for confusion. The patient is not nervous/anxious.     Immunization History  Administered Date(s) Administered   Fluad Quad(high Dose 65+) 11/24/2020, 11/25/2021, 11/30/2022   Influenza, High Dose Seasonal PF 11/24/2020   Influenza-Unspecified 01/08/2002, 12/16/2003, 11/07/2007   Moderna Covid-19 Vaccine Bivalent Booster 60yrs & up 11/16/2022   Moderna Sars-Covid-2 Vaccination 12/15/2021   PFIZER Comirnaty(Gray Top)Covid-19 Tri-Sucrose Vaccine 03/16/2019, 04/06/2019, 11/05/2019   PNEUMOCOCCAL CONJUGATE-20 06/02/2021   Pfizer Covid-19 Vaccine Bivalent Booster 65yrs & up 06/27/2020, 10/28/2020   Td 06/09/2001   Td (Adult),5 Lf Tetanus Toxid, Preservative Free 06/09/2001   Td (Adult),unspecified 06/09/2001   Pertinent  Health Maintenance Due  Topic Date Due   OPHTHALMOLOGY EXAM  03/04/2023   FOOT EXAM  06/02/2023   HEMOGLOBIN A1C  08/10/2023   INFLUENZA VACCINE  Completed      06/02/2022   12:59 PM 09/14/2022   10:47 AM 12/15/2022    8:35 AM 02/16/2023    1:47 PM 03/17/2023    2:43 PM  Fall Risk  Falls in the past year? 0 0 0 0 0  Was there an injury with Fall? 0 0 0 0 0  Fall Risk Category Calculator 0 0 0 0 0  Patient at Risk for Falls Due to No Fall Risks No Fall Risks No Fall Risks No Fall Risks   Fall risk Follow up Falls evaluation completed Falls evaluation completed Falls evaluation completed Falls evaluation  completed    Functional Status Survey:    Vitals:   03/17/23 1447  BP: 126/76  Pulse: 68  Resp: 19  Temp: 98 F (36.7 C)  SpO2: 96%  Weight: 211 lb (95.7 kg)  Height: 5' 11 (1.803 m)   Body mass index is 29.43 kg/m. Physical Exam Vitals and nursing note reviewed.  Constitutional:      Appearance: Normal appearance.  HENT:     Head: Normocephalic and atraumatic.     Nose: Nose normal.     Mouth/Throat:     Mouth: Mucous membranes are moist.  Eyes:     Extraocular Movements: Extraocular movements intact.     Conjunctiva/sclera: Conjunctivae normal.     Pupils: Pupils are equal, round, and reactive to light.  Cardiovascular:     Rate and Rhythm: Normal rate and regular rhythm.     Heart sounds: No murmur heard.    Comments: Hx of PAC Pulmonary:     Effort: Pulmonary effort is normal.     Breath sounds: No rales.  Abdominal:     General: Bowel sounds are normal.     Palpations: Abdomen is soft.     Tenderness: There is no abdominal tenderness.  Musculoskeletal:        General: No swelling or tenderness.     Cervical back: Normal range of motion and neck supple.     Right lower leg: No edema.     Left lower leg: No edema.  Skin:    General: Skin is warm and dry.  Neurological:     General: No focal deficit present.  Mental Status: He is alert and oriented to person, place, and time. Mental status is at baseline.  Psychiatric:        Mood and Affect: Mood normal.        Behavior: Behavior normal.        Thought Content: Thought content normal.        Judgment: Judgment normal.     Labs reviewed: Recent Labs    09/02/22 0753 12/06/22 0000 12/06/22 0800 02/10/23 0818  NA 139 140 140 137  K 4.2 4.5 4.5 4.6  CL 105 104 104 104  CO2 27 31* 31 27  GLUCOSE 111*  --  131* 106*  BUN 19 19 19 22   CREATININE 0.82 0.9 0.92 0.86  CALCIUM  9.4 9.6 9.6 9.4   Recent Labs    06/10/22 1333 09/02/22 0753 02/10/23 0818  AST 21 14 20   ALT 7 7* 9  ALKPHOS 83   --   --   BILITOT 0.6 0.6 0.9  PROT 7.2 6.5 6.9  ALBUMIN 4.1  --   --    Recent Labs    04/27/22 0708 06/10/22 1333 09/02/22 0753 02/10/23 0818  WBC 3.8 6.6 4.7 5.0  NEUTROABS 2,307  --  2,726 3,190  HGB 14.6 14.2 14.3 15.6  HCT 43.4 43.7 41.9 46.0  MCV 90.4 93.8 90.7 92.7  PLT 251 237 271 226   Lab Results  Component Value Date   TSH 1.57 02/10/2023   Lab Results  Component Value Date   HGBA1C 7.3 (H) 02/10/2023   Lab Results  Component Value Date   CHOL 127 02/10/2023   HDL 42 02/10/2023   LDLCALC 69 02/10/2023   TRIG 81 02/10/2023   CHOLHDL 3.0 02/10/2023    Significant Diagnostic Results in last 30 days:  No results found.  Assessment/Plan: Type 2 diabetes mellitus with diabetic neuropathy, unspecified (HCC)  in feet/toes, takes Gabapentin  T2DM insulin  dependent, takes insulin , well controlled. Hgb A1c 7.3, TSH 1.57  02/10/23 resolved shooting pain in the left calf a couple of times, short lived in nature and resolved w/o intervention. No pain in the left calf palpated or with left foot dorsiflexion. No redness, warmth, or swelling in LLE noted. There was bruise mid left shin from pumped into the door of his car is healing.  Observe.   Depression, recurrent (HCC) Stable,  taking Celexa    Family/ staff Communication: plan of care reviewed with the patient  Labs/tests ordered:  none  Next appointment 05/10/23

## 2023-03-18 ENCOUNTER — Encounter: Payer: Self-pay | Admitting: Nurse Practitioner

## 2023-03-18 DIAGNOSIS — F339 Major depressive disorder, recurrent, unspecified: Secondary | ICD-10-CM | POA: Insufficient documentation

## 2023-03-18 NOTE — Assessment & Plan Note (Addendum)
 in feet/toes, takes Gabapentin  T2DM insulin  dependent, takes insulin , well controlled. Hgb A1c 7.3, TSH 1.57  02/10/23 resolved shooting pain in the left calf a couple of times, short lived in nature and resolved w/o intervention. No pain in the left calf palpated or with left foot dorsiflexion. No redness, warmth, or swelling in LLE noted. There was bruise mid left shin from pumped into the door of his car is healing.  Observe.

## 2023-03-18 NOTE — Assessment & Plan Note (Signed)
 Stable,  taking Celexa

## 2023-03-18 NOTE — Assessment & Plan Note (Signed)
Hx of lumbar spine fusion, with sciatica, spastic pain in legs occurs mostly in 3 am, takes Gabapentin, Sinemet 

## 2023-03-31 ENCOUNTER — Ambulatory Visit: Payer: Medicare HMO | Admitting: Podiatry

## 2023-04-05 DIAGNOSIS — M65332 Trigger finger, left middle finger: Secondary | ICD-10-CM | POA: Diagnosis not present

## 2023-04-12 ENCOUNTER — Ambulatory Visit: Payer: Medicare HMO | Admitting: Podiatry

## 2023-04-15 ENCOUNTER — Other Ambulatory Visit: Payer: Self-pay | Admitting: Internal Medicine

## 2023-04-15 DIAGNOSIS — Z981 Arthrodesis status: Secondary | ICD-10-CM

## 2023-04-15 DIAGNOSIS — E1149 Type 2 diabetes mellitus with other diabetic neurological complication: Secondary | ICD-10-CM

## 2023-04-17 ENCOUNTER — Other Ambulatory Visit: Payer: Self-pay | Admitting: Internal Medicine

## 2023-04-17 DIAGNOSIS — E1140 Type 2 diabetes mellitus with diabetic neuropathy, unspecified: Secondary | ICD-10-CM

## 2023-04-18 IMAGING — DX DG CHEST 1V PORT
1 series · 2 of 2 positions shown · non-contrast
Comparison: None.

CLINICAL DATA: Fever, headache, cough

EXAM:
PORTABLE CHEST 1 VIEW

[Series 1: chest · 0.14mm/px · 2 of 2 slices shown]
[im 1/2]
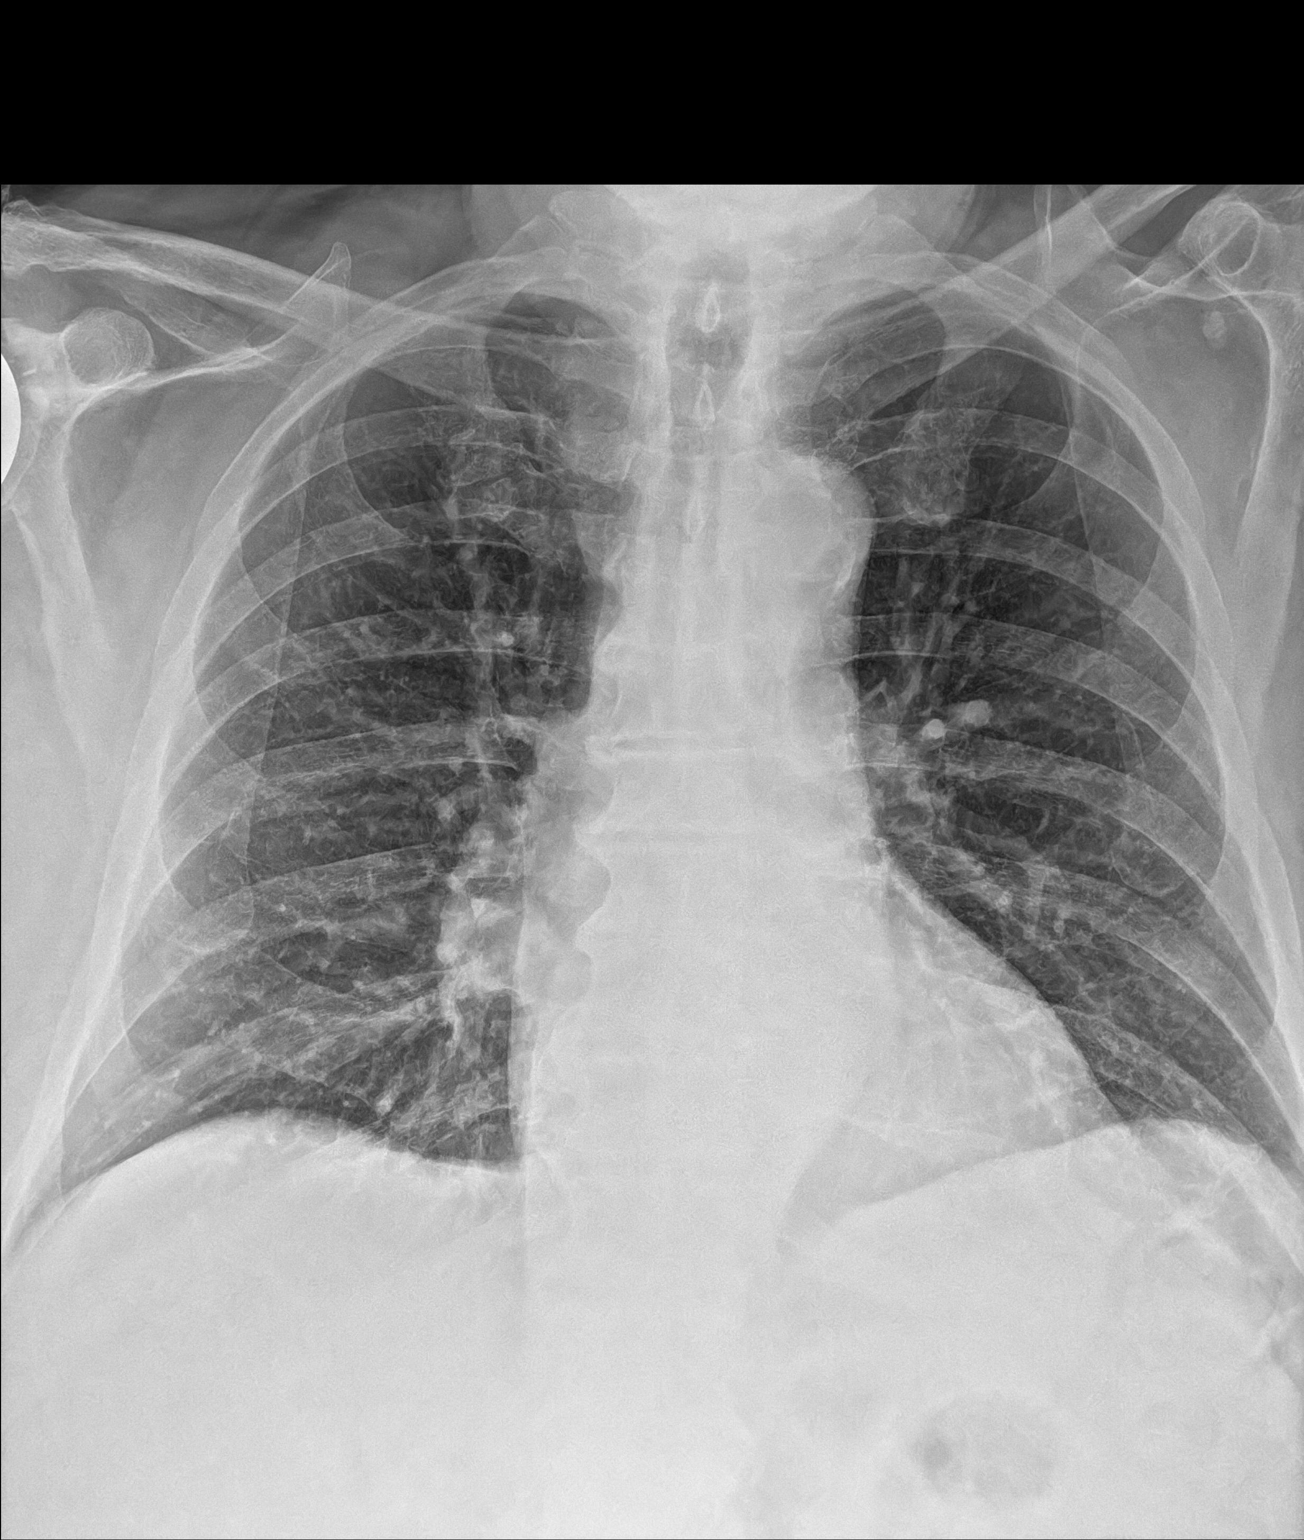
[im 2/2]
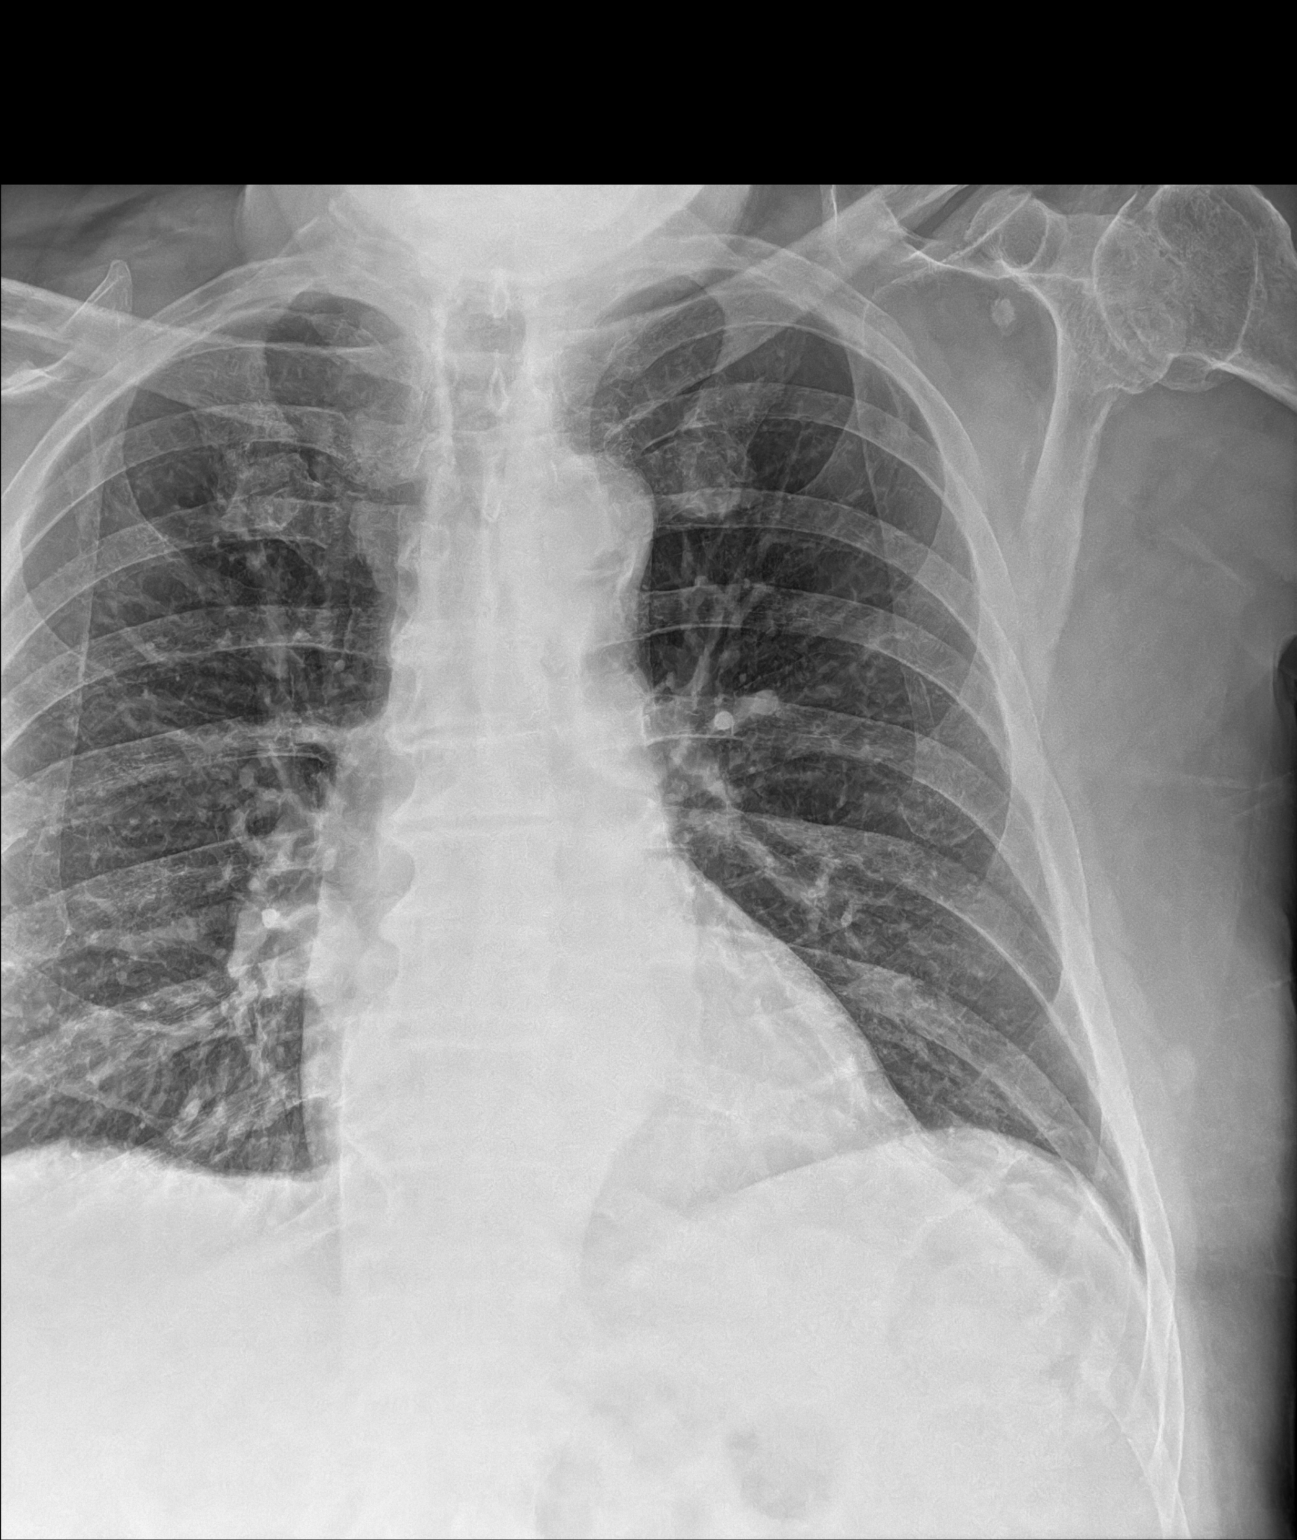

[2 of 2 positions shown; findings below may reference images not displayed]

FINDINGS: The heart size and mediastinal contours are within normal limits.
Both lungs are clear. The visualized skeletal structures are
unremarkable.
IMPRESSION: No active disease.

## 2023-04-20 ENCOUNTER — Other Ambulatory Visit: Payer: Self-pay | Admitting: Internal Medicine

## 2023-04-20 DIAGNOSIS — E782 Mixed hyperlipidemia: Secondary | ICD-10-CM

## 2023-04-21 ENCOUNTER — Ambulatory Visit (INDEPENDENT_AMBULATORY_CARE_PROVIDER_SITE_OTHER): Admitting: Podiatry

## 2023-04-21 ENCOUNTER — Encounter: Payer: Self-pay | Admitting: Podiatry

## 2023-04-21 DIAGNOSIS — E1142 Type 2 diabetes mellitus with diabetic polyneuropathy: Secondary | ICD-10-CM

## 2023-04-21 DIAGNOSIS — B351 Tinea unguium: Secondary | ICD-10-CM | POA: Diagnosis not present

## 2023-04-21 DIAGNOSIS — M79676 Pain in unspecified toe(s): Secondary | ICD-10-CM

## 2023-04-21 NOTE — Progress Notes (Signed)
 Subjective:  Patient ID: Stephen Burns, male    DOB: 1940-08-18,  MRN: [id number] HPI Chief Complaint  Patient presents with   Diabetes    Diabetic foot exam - last A1c was 7.3, PCP referred, concerned about the look of the hallux right, but not sore, thickened toenail, PCP Rx'd nail lacquer to use on it   New Patient (Initial Visit)    83 y.o. male presents with the above complaint.   ROS: Denies fever chills nausea vomit muscle aches pains calf pain back pain chest pain shortness of breath.  States that his hemoglobin A1c is usually much lower.  Past Medical History:  Diagnosis Date   Arthritis    Complication of anesthesia    Difficult to wake up   Depression    Diabetes mellitus without complication (HCC)    Headache    History of kidney stones    Pulmonary embolism (HCC)    After shoulder surgery   Vertigo    Past Surgical History:  Procedure Laterality Date   ADENOIDECTOMY Bilateral    FINGER SURGERY     Left middle   FOOT SURGERY Left    laceration/ tendon repair   forehead surgery     L4-L5 fusion     LITHOTRIPSY     Prostrate     Frozen   REVERSE SHOULDER ARTHROPLASTY Left 06/17/2022   Procedure: REVERSE SHOULDER ARTHROPLASTY;  Surgeon: Francena Hanly, MD;  Location: [facility];  Service: Orthopedics;  Laterality: Left;    SHOULDER SURGERY     Complete replacement to right shoulder   TONSILLECTOMY     TRANSURETHRAL RESECTION OF PROSTATE N/A 09/02/2020   Procedure: TRANSURETHRAL RESECTION OF THE PROSTATE (TURP);  Surgeon: Bjorn Pippin, MD;  Location: [facility];  Service: Urology;  Laterality: N/A;    Current Outpatient Medications:    Accu-Chek Softclix Lancets lancets, Use to check blood glucose level twice a day E11.40, Disp: 100 each, Rfl: 12   acetaminophen (TYLENOL) 500 MG tablet, Take 500 mg by mouth 2 (two) times daily., Disp: , Rfl:    atorvastatin (LIPITOR) 40 MG tablet, Take 1 tablet (40 mg total) by mouth at bedtime. E78.2, Disp: 90 tablet, Rfl: 3    B-D ULTRAFINE III SHORT PEN 31G X 8 MM MISC, USE AS DIRECTED, Disp: 100 each, Rfl: 3   Blood Glucose Monitoring Suppl (ACCU-CHEK GUIDE ME) w/Device KIT, 1 kit by Does not apply route daily. DX: E11.40, Disp: 1 kit, Rfl: 0   carbidopa-levodopa (SINEMET CR) 50-200 MG tablet, TAKE 1 TABLET BY MOUTH TWICE A DAY, Disp: 180 tablet, Rfl: 1   ciclopirox (CICLODAN) 8 % solution, Apply topically at bedtime. Apply over nail and surrounding skin. Apply daily over previous coat. After seven (7) days, may remove with alcohol and continue cycle., Disp: 6.6 mL, Rfl: 0   citalopram (CELEXA) 20 MG tablet, Take 20 mg by mouth daily., Disp: , Rfl:    escitalopram (LEXAPRO) 10 MG tablet, TAKE 1 TABLET BY MOUTH EVERY DAY (Patient not taking: Reported on 03/17/2023), Disp: 90 tablet, Rfl: 2   fluticasone (FLONASE) 50 MCG/ACT nasal spray, Place 1 spray into both nostrils daily as needed for allergies or rhinitis., Disp: , Rfl:    gabapentin (NEURONTIN) 300 MG capsule, TAKE TWO CAPSULES IN THE MORNING, TAKE ONE IN THE AFTERNOON AND TAKE TWO AT BEDTIME., Disp: 450 capsule, Rfl: 1   glucose blood (ACCU-CHEK GUIDE) test strip, Use to test blood glucose level twice a day DX: E11.40, Disp:  100 each, Rfl: 12   hydrocortisone cream 1 %, Apply 1 Application topically 2 (two) times daily as needed for itching., Disp: , Rfl:    Insulin Glargine (BASAGLAR KWIKPEN) 100 UNIT/ML, INJECT 35 UNITS INTO THE SKIN IN THE MORNING. E11.40, Disp: 15 mL, Rfl: 5   meclizine (ANTIVERT) 25 MG tablet, Take 1 tablet (25 mg total) by mouth 3 (three) times daily as needed for dizziness., Disp: 30 tablet, Rfl: 0   MULTIPLE MINERALS PO, Take 1 tablet by mouth daily., Disp: , Rfl:    polyethylene glycol (MIRALAX / GLYCOLAX) 17 g packet, Take 17 g by mouth daily., Disp: , Rfl:   Allergies  Allergen Reactions   Sulfa Antibiotics Swelling    Joint swelling   Review of Systems Objective:  There were no vitals filed for this visit.  General: Well  developed, nourished, in no acute distress, alert and oriented x3   Dermatological: Skin is warm, dry and supple bilateral. Nails x 10 are well maintained using shoe hallux right is thick dystrophic.  Painful.; remaining integument appears unremarkable at this time. There are no open sores, no preulcerative lesions, no rash or signs of infection present.  Vascular: Dorsalis Pedis artery and Posterior Tibial artery pedal pulses are 2/4 bilateral with immedate capillary fill time. Pedal hair growth present. No varicosities and no lower extremity edema present bilateral.   Neruologic: Grossly intact via light touch bilateral. Vibratory intact via tuning fork bilateral. Protective threshold with Semmes Wienstein monofilament intact to all pedal sites bilateral. Patellar and Achilles deep tendon reflexes 2+ bilateral. No Babinski or clonus noted bilateral.   Musculoskeletal: No gross boney pedal deformities bilateral. No pain, crepitus, or limitation noted with foot and ankle range of motion bilateral. Muscular strength 5/5 in all groups tested bilateral.  Gait: Unassisted, Nonantalgic.    Radiographs:  None taken  Assessment & Plan:   Assessment: Diabetes mellitus without podiatric complications.  Onychodystrophy hallux right cannot rule out onychomycosis.  PCP currently has him utilizing nail lacquer.  Plan: Debrided long dystrophic nails     Stephen Prisco T. [place], [place]

## 2023-04-27 DIAGNOSIS — H52223 Regular astigmatism, bilateral: Secondary | ICD-10-CM | POA: Diagnosis not present

## 2023-04-27 DIAGNOSIS — E119 Type 2 diabetes mellitus without complications: Secondary | ICD-10-CM | POA: Diagnosis not present

## 2023-04-27 LAB — HM DIABETES EYE EXAM

## 2023-05-01 ENCOUNTER — Other Ambulatory Visit: Payer: Self-pay | Admitting: Internal Medicine

## 2023-05-01 DIAGNOSIS — Z981 Arthrodesis status: Secondary | ICD-10-CM

## 2023-05-10 ENCOUNTER — Other Ambulatory Visit: Payer: Medicare HMO

## 2023-05-10 DIAGNOSIS — Z794 Long term (current) use of insulin: Secondary | ICD-10-CM | POA: Diagnosis not present

## 2023-05-10 DIAGNOSIS — E782 Mixed hyperlipidemia: Secondary | ICD-10-CM | POA: Diagnosis not present

## 2023-05-10 DIAGNOSIS — E1140 Type 2 diabetes mellitus with diabetic neuropathy, unspecified: Secondary | ICD-10-CM | POA: Diagnosis not present

## 2023-05-11 LAB — LIPID PANEL
Cholesterol: 121 mg/dL (ref <200)
HDL: 40 mg/dL (ref >=40)
LDL Cholesterol (Calc): 64 mg/dL
Non-HDL Cholesterol (Calc): 81 mg/dL (ref <130)
Total CHOL/HDL Ratio: 3 (calc) (ref <5.0)
Triglycerides: 86 mg/dL (ref <150)

## 2023-05-11 LAB — COMPREHENSIVE METABOLIC PANEL WITH GFR
AG Ratio: 2 (calc) (ref 1.0–2.5)
ALT: 17 U/L (ref 9–46)
AST: 20 U/L (ref 10–35)
Albumin: 4.3 g/dL (ref 3.6–5.1)
Alkaline phosphatase (APISO): 90 U/L (ref 35–144)
BUN: 23 mg/dL (ref 7–25)
CO2: 24 mmol/L (ref 20–32)
Calcium: 9 mg/dL (ref 8.6–10.3)
Chloride: 108 mmol/L (ref 98–110)
Creat: 0.74 mg/dL (ref 0.70–1.22)
Globulin: 2.1 g/dL (ref 1.9–3.7)
Glucose, Bld: 131 mg/dL — ABNORMAL HIGH (ref 65–99)
Potassium: 4.1 mmol/L (ref 3.5–5.3)
Sodium: 141 mmol/L (ref 135–146)
Total Bilirubin: 0.7 mg/dL (ref 0.2–1.2)
Total Protein: 6.4 g/dL (ref 6.1–8.1)
eGFR: 91 mL/min/{1.73_m2} (ref >=60)

## 2023-05-11 LAB — CBC WITH DIFFERENTIAL/PLATELET
Absolute Lymphocytes: 1003 {cells}/uL (ref 850–3900)
Absolute Monocytes: 504 {cells}/uL (ref 200–950)
Basophils Absolute: 19 {cells}/uL (ref 0–200)
Basophils Relative: 0.4 %
Eosinophils Absolute: 110 {cells}/uL (ref 15–500)
Eosinophils Relative: 2.3 %
HCT: 43.1 % (ref 38.5–50.0)
Hemoglobin: 14.6 g/dL (ref 13.2–17.1)
MCH: 31.5 pg (ref 27.0–33.0)
MCHC: 33.9 g/dL (ref 32.0–36.0)
MCV: 92.9 fL (ref 80.0–100.0)
MPV: 10.2 fL (ref 7.5–12.5)
Monocytes Relative: 10.5 %
Neutro Abs: 3163 {cells}/uL (ref 1500–7800)
Neutrophils Relative %: 65.9 %
Platelets: 229 10*3/uL (ref 140–400)
RBC: 4.64 10*6/uL (ref 4.20–5.80)
RDW: 12.5 % (ref 11.0–15.0)
Total Lymphocyte: 20.9 %
WBC: 4.8 10*3/uL (ref 3.8–10.8)

## 2023-05-11 LAB — HEMOGLOBIN A1C
Hgb A1c MFr Bld: 7 %{Hb} — ABNORMAL HIGH (ref <5.7)
Mean Plasma Glucose: 154 mg/dL
eAG (mmol/L): 8.5 mmol/L

## 2023-05-11 LAB — TSH: TSH: 1.56 m[IU]/L (ref 0.40–4.50)

## 2023-05-18 ENCOUNTER — Non-Acute Institutional Stay: Payer: Medicare HMO | Admitting: Internal Medicine

## 2023-05-18 ENCOUNTER — Encounter: Payer: Self-pay | Admitting: Internal Medicine

## 2023-05-18 VITALS — BP 125/87 | HR 73 | Temp 97.1°F | Resp 18 | Ht 74.0 in | Wt 213.8 lb

## 2023-05-18 DIAGNOSIS — Z794 Long term (current) use of insulin: Secondary | ICD-10-CM | POA: Diagnosis not present

## 2023-05-18 DIAGNOSIS — E782 Mixed hyperlipidemia: Secondary | ICD-10-CM

## 2023-05-18 DIAGNOSIS — E1140 Type 2 diabetes mellitus with diabetic neuropathy, unspecified: Secondary | ICD-10-CM

## 2023-05-18 DIAGNOSIS — B351 Tinea unguium: Secondary | ICD-10-CM | POA: Diagnosis not present

## 2023-05-18 DIAGNOSIS — H8113 Benign paroxysmal vertigo, bilateral: Secondary | ICD-10-CM

## 2023-05-18 DIAGNOSIS — F330 Major depressive disorder, recurrent, mild: Secondary | ICD-10-CM | POA: Diagnosis not present

## 2023-05-18 DIAGNOSIS — G4762 Sleep related leg cramps: Secondary | ICD-10-CM

## 2023-05-18 DIAGNOSIS — F339 Major depressive disorder, recurrent, unspecified: Secondary | ICD-10-CM

## 2023-05-18 MED ORDER — MECLIZINE HCL 25 MG PO TABS: 25.0000 mg | ORAL_TABLET | Freq: Three times a day (TID) | ORAL | 0 refills | Status: AC | PRN

## 2023-05-18 MED ORDER — AMOXICILLIN 500 MG PO CAPS: ORAL_CAPSULE | ORAL | 0 refills | Status: AC

## 2023-05-18 MED ORDER — CICLOPIROX 8 % EX SOLN: Freq: Every day | CUTANEOUS | 0 refills | Status: DC

## 2023-05-18 MED ORDER — DEXCOM G7 RECEIVER DEVI: 0 refills | Status: AC

## 2023-05-18 NOTE — Patient Instructions (Addendum)
 Lab work will be done on July 7 th at [facility] at 7:45 am

## 2023-05-18 NOTE — Progress Notes (Signed)
 Location:   [facility] cur[facility] of Service:   Clinic  Provider:   Code Status:  Goals of Care:     05/18/2023    1:45 PM  Advanced Directives  Does Patient Have a Medical Advance Directive? No  Would patient like information on creating a medical advance directive? No - Patient declined     Chief Complaint  Patient presents with   Care Management    HPI: Patient is a 83 y.o. male seen today for medical management of chronic diseases.   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 and nothing more can be done H/o Lung Nodules  Mostly benign follows with Dr Delton Coombes Repeat CT was negative    The patient also reports a fungal infection under the big toe of their right foot, which has not improved with over-the-counter antifungal treatmen  Past Medical History:  Diagnosis Date   Arthritis    Complication of anesthesia    Difficult to wake up   Depression    Diabetes mellitus without complication (HCC)    Headache    History of kidney stones    Pulmonary embolism (HCC)    After shoulder surgery   Vertigo     Past Surgical History:  Procedure Laterality Date   ADENOIDECTOMY Bilateral    FINGER SURGERY     Left middle   FOOT SURGERY Left    laceration/ tendon repair   forehead surgery     L4-L5 fusion     LITHOTRIPSY     Prostrate     Frozen   REVERSE SHOULDER ARTHROPLASTY Left 06/17/2022   Procedure:  REVERSE SHOULDER ARTHROPLASTY;  Surgeon: Francena Hanly, MD;  Location: [facility];  Service: Orthopedics;  Laterality: Left;    SHOULDER SURGERY     Complete replacement to right shoulder   TONSILLECTOMY     TRANSURETHRAL RESECTION OF PROSTATE N/A 09/02/2020   Procedure: TRANSURETHRAL RESECTION OF THE PROSTATE (TURP);  Surgeon: Bjorn Pippin, MD;  Location: [facility];  Service: Urology;  Laterality: N/A;    Allergies  Allergen Reactions   Sulfa Antibiotics Swelling    Joint swelling    Outpatient Encounter Medications as of 05/18/2023  Medication Sig   Accu-Chek Softclix Lancets lancets Use to check blood glucose level twice a day E11.40   acetaminophen (TYLENOL) 500 MG tablet Take 500 mg by mouth 2 (two) times daily.   amoxicillin (AMOXIL) 500 MG capsule Take 4 tabs 1 hour before Dental Cleaning   atorvastatin (LIPITOR) 40 MG tablet Take 1 tablet (40 mg total) by mouth at bedtime. E78.2   B-D ULTRAFINE III SHORT PEN 31G X 8 MM MISC USE AS DIRECTED   Blood Glucose Monitoring Suppl (ACCU-CHEK GUIDE ME) w/Device KIT 1 kit by Does not apply route daily. DX: E11.40   carbidopa-levodopa (SINEMET CR) 50-200 MG tablet TAKE 1  TABLET BY MOUTH TWICE A DAY   Continuous Glucose Receiver (DEXCOM G7 RECEIVER) DEVI For CBG monitoring   escitalopram (LEXAPRO) 10 MG tablet TAKE 1 TABLET BY MOUTH EVERY DAY   fluticasone (FLONASE) 50 MCG/ACT nasal spray Place 1 spray into both nostrils daily as needed for allergies or rhinitis.   gabapentin (NEURONTIN) 300 MG capsule TAKE TWO CAPSULES IN THE MORNING, TAKE ONE IN THE AFTERNOON AND TAKE TWO AT BEDTIME.   glucose blood (ACCU-CHEK GUIDE) test strip Use to test blood glucose level twice a day DX: E11.40   hydrocortisone cream 1 % Apply 1 Application topically 2 (two) times daily as needed for itching.   Insulin Glargine (BASAGLAR KWIKPEN) 100 UNIT/ML INJECT 35 UNITS INTO THE SKIN IN THE MORNING. E11.40   MULTIPLE MINERALS PO Take 1 tablet by mouth daily.    polyethylene glycol (MIRALAX / GLYCOLAX) 17 g packet Take 17 g by mouth daily.   [DISCONTINUED] ciclopirox (CICLODAN) 8 % solution Apply topically at bedtime. Apply over nail and surrounding skin. Apply daily over previous coat. After seven (7) days, may remove with alcohol and continue cycle.   [DISCONTINUED] citalopram (CELEXA) 20 MG tablet Take 20 mg by mouth daily.   [DISCONTINUED] meclizine (ANTIVERT) 25 MG tablet Take 1 tablet (25 mg total) by mouth 3 (three) times daily as needed for dizziness.   ciclopirox (CICLODAN) 8 % solution Apply topically at bedtime. Apply over nail and surrounding skin. Apply daily over previous coat. After seven (7) days, may remove with alcohol and continue cycle.   meclizine (ANTIVERT) 25 MG tablet Take 1 tablet (25 mg total) by mouth 3 (three) times daily as needed for dizziness.   No facility-administered encounter medications on file as of 05/18/2023.    Review of Systems:  Review of Systems  Constitutional:  Negative for activity change, appetite change and unexpected weight change.  HENT: Negative.    Respiratory:  Negative for cough and shortness of breath.   Cardiovascular:  Negative for leg swelling.  Gastrointestinal:  Negative for constipation.  Genitourinary:  Negative for frequency.  Musculoskeletal:  Positive for arthralgias, gait problem, myalgias and neck pain.  Skin: Negative.  Negative for rash.  Neurological:  Positive for dizziness. Negative for weakness.  Psychiatric/Behavioral:  Negative for confusion and sleep disturbance.   All other systems reviewed and are negative.   Health Maintenance  Topic Date Due   Zoster Vaccines- Shingrix (1 of 2) Never done   DTaP/Tdap/Td (4 - Tdap) 06/10/2011   Diabetic kidney evaluation - Urine ACR  05/29/2021   Medicare Annual Wellness (AWV)  01/05/2023   COVID-19 Vaccine (8 - 2024-25 season) 01/11/2023   FOOT EXAM  06/02/2023   INFLUENZA VACCINE  09/09/2023   HEMOGLOBIN A1C  11/09/2023    OPHTHALMOLOGY EXAM  04/26/2024   Diabetic kidney evaluation - eGFR measurement  05/09/2024   Pneumonia Vaccine 78+ Years old  Completed   HPV VACCINES  Aged Out    Physical Exam: Vitals:   05/18/23 1345  BP: 125/87  Pulse: 73  Resp: 18  Temp: (!) 97.1 F (36.2 C)  SpO2: 95%  Weight: 213 lb 12.8 oz (97 kg)  Height: 6\' 2"  (1.88 m)   Body mass index is 27.45 kg/m. Physical Exam Vitals reviewed.  Constitutional:      Appearance: Normal appearance.  HENT:     Head: Normocephalic.     Nose: Nose normal.     Mouth/Throat:     Mouth: Mucous membranes are moist.  Pharynx: Oropharynx is clear.  Eyes:     Pupils: Pupils are equal, round, and reactive to light.  Cardiovascular:     Rate and Rhythm: Normal rate and regular rhythm.     Pulses: Normal pulses.     Heart sounds: No murmur heard. Pulmonary:     Effort: Pulmonary effort is normal. No respiratory distress.     Breath sounds: Normal breath sounds. No rales.  Abdominal:     General: Abdomen is flat. Bowel sounds are normal.     Palpations: Abdomen is soft.  Musculoskeletal:        General: No swelling.     Cervical back: Neck supple.  Skin:    General: Skin is warm.  Neurological:     General: No focal deficit present.     Mental Status: He is alert and oriented to person, place, and time.  Psychiatric:        Mood and Affect: Mood normal.        Thought Content: Thought content normal.     Labs reviewed: Basic Metabolic Panel: Recent Labs    12/06/22 0800 02/10/23 0818 05/10/23 0707  NA 140 137 141  K 4.5 4.6 4.1  CL 104 104 108  CO2 31 27 24   GLUCOSE 131* 106* 131*  BUN 19 22 23   CREATININE 0.92 0.86 0.74  CALCIUM 9.6 9.4 9.0  TSH  --  1.57 1.56   Liver Function Tests: Recent Labs    06/10/22 1333 09/02/22 0753 02/10/23 0818 05/10/23 0707  AST 21 14 20 20   ALT 7 7* 9 17  ALKPHOS 83  --   --   --   BILITOT 0.6 0.6 0.9 0.7  PROT 7.2 6.5 6.9 6.4  ALBUMIN 4.1  --   --   --    No  results for input(s): "LIPASE", "AMYLASE" in the last 8760 hours. No results for input(s): "AMMONIA" in the last 8760 hours. CBC: Recent Labs    09/02/22 0753 02/10/23 0818 05/10/23 0707  WBC 4.7 5.0 4.8  NEUTROABS 2,726 3,190 3,163  HGB 14.3 15.6 14.6  HCT 41.9 46.0 43.1  MCV 90.7 92.7 92.9  PLT 271 226 229   Lipid Panel: Recent Labs    09/02/22 0753 02/10/23 0818 05/10/23 0707  CHOL 120 127 121  HDL 39* 42 40  LDLCALC 67 69 64  TRIG 64 81 86  CHOLHDL 3.1 3.0 3.0   Lab Results  Component Value Date   HGBA1C 7.0 (H) 05/10/2023    Procedures since last visit: No results found.  Assessment/Plan 1. Type 2 diabetes mellitus with diabetic neuropathy, with long-term current use of insulin (HCC) (Primary) A1C good range but he had episode of Symptomatic Hypoglycemia I am Ordering DEXCom for him He is carrying Sugar packets with him  2. 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 Onychomycosis Seen By Podiatry Continue Ciclopirox for now   5. Mixed hyperlipidemia On statin  6. Nocturnal leg cramps Sinemet  7. 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 No intervention planned. -Continue current management.           Assessment & Plan    Labs/tests ordered:  * No order type specified * Next appt:  Visit date not found

## 2023-05-25 DIAGNOSIS — H8112 Benign paroxysmal vertigo, left ear: Secondary | ICD-10-CM | POA: Diagnosis not present

## 2023-05-25 DIAGNOSIS — R2681 Unsteadiness on feet: Secondary | ICD-10-CM | POA: Diagnosis not present

## 2023-05-30 ENCOUNTER — Other Ambulatory Visit: Payer: Self-pay | Admitting: *Deleted

## 2023-05-30 DIAGNOSIS — R2681 Unsteadiness on feet: Secondary | ICD-10-CM | POA: Diagnosis not present

## 2023-05-30 DIAGNOSIS — H8112 Benign paroxysmal vertigo, left ear: Secondary | ICD-10-CM | POA: Diagnosis not present

## 2023-05-30 MED ORDER — DEXCOM G7 SENSOR MISC: 1 refills | Status: DC

## 2023-05-30 NOTE — Telephone Encounter (Signed)
 Pharmacy requested refill. Stated patient has the receiver but needs the sensors.

## 2023-06-02 DIAGNOSIS — H8112 Benign paroxysmal vertigo, left ear: Secondary | ICD-10-CM | POA: Diagnosis not present

## 2023-06-02 DIAGNOSIS — R2681 Unsteadiness on feet: Secondary | ICD-10-CM | POA: Diagnosis not present

## 2023-06-06 DIAGNOSIS — H8112 Benign paroxysmal vertigo, left ear: Secondary | ICD-10-CM | POA: Diagnosis not present

## 2023-06-06 DIAGNOSIS — R2681 Unsteadiness on feet: Secondary | ICD-10-CM | POA: Diagnosis not present

## 2023-06-10 DIAGNOSIS — H8112 Benign paroxysmal vertigo, left ear: Secondary | ICD-10-CM | POA: Diagnosis not present

## 2023-06-10 DIAGNOSIS — R2681 Unsteadiness on feet: Secondary | ICD-10-CM | POA: Diagnosis not present

## 2023-06-13 DIAGNOSIS — R2681 Unsteadiness on feet: Secondary | ICD-10-CM | POA: Diagnosis not present

## 2023-06-13 DIAGNOSIS — H8112 Benign paroxysmal vertigo, left ear: Secondary | ICD-10-CM | POA: Diagnosis not present

## 2023-06-15 DIAGNOSIS — H8112 Benign paroxysmal vertigo, left ear: Secondary | ICD-10-CM | POA: Diagnosis not present

## 2023-06-15 DIAGNOSIS — R2681 Unsteadiness on feet: Secondary | ICD-10-CM | POA: Diagnosis not present

## 2023-06-26 ENCOUNTER — Other Ambulatory Visit: Payer: Self-pay | Admitting: Internal Medicine

## 2023-06-27 NOTE — Telephone Encounter (Signed)
Requested medication is not on active medication list  

## 2023-07-07 ENCOUNTER — Other Ambulatory Visit: Payer: Self-pay | Admitting: Internal Medicine

## 2023-07-07 NOTE — Telephone Encounter (Signed)
Patient has request refill on medication that's not on list. Medication pend and sent to PCP Mahlon Gammon, MD for approval.

## 2023-07-11 ENCOUNTER — Other Ambulatory Visit: Payer: Self-pay | Admitting: Internal Medicine

## 2023-07-11 MED ORDER — ESCITALOPRAM OXALATE 10 MG PO TABS: 10.0000 mg | ORAL_TABLET | Freq: Every day | ORAL | 2 refills | Status: AC

## 2023-07-13 ENCOUNTER — Telehealth: Payer: Self-pay | Admitting: Internal Medicine

## 2023-07-13 NOTE — Telephone Encounter (Signed)
 Patient called and he had bottle of Celexa 20 mg which I had not prescribed. I called the [facility] and they said they have mad a mistake. He is suppose to be taking Lexapro  10 mg every day I have called Stephen Burns and Told him not to take Celexa and just take Lexapro  10 mg.

## 2023-07-13 NOTE — Telephone Encounter (Signed)
 Copied from CRM [id number]. Topic: Clinical - Prescription Issue >> Jul 13, 2023 11:47 AM Latavia C wrote: Reason for CRM:  Dolly from [facility] called to speak w provider about medication error. Please give her a call back at [phone]  Spoke with [place] at Pharmacy and she stated she spoke with the provider and it was a system issue on their part and it is fixed.

## 2023-07-21 NOTE — Telephone Encounter (Signed)
 Stephen Burns

## 2023-07-25 ENCOUNTER — Ambulatory Visit (INDEPENDENT_AMBULATORY_CARE_PROVIDER_SITE_OTHER): Admitting: Podiatry

## 2023-07-25 ENCOUNTER — Encounter: Payer: Self-pay | Admitting: Podiatry

## 2023-07-25 ENCOUNTER — Ambulatory Visit: Payer: Self-pay

## 2023-07-25 DIAGNOSIS — B351 Tinea unguium: Secondary | ICD-10-CM

## 2023-07-25 DIAGNOSIS — E1142 Type 2 diabetes mellitus with diabetic polyneuropathy: Secondary | ICD-10-CM

## 2023-07-25 DIAGNOSIS — M79676 Pain in unspecified toe(s): Secondary | ICD-10-CM

## 2023-07-25 NOTE — Telephone Encounter (Signed)
 This encounter was created in error - please disregard.

## 2023-07-25 NOTE — Telephone Encounter (Signed)
 FYI Only or Action Required?: Action required by provider  Patient was last seen in primary care on 05/18/2023 by Stephen Shiley, MD. Called Nurse Triage reporting Dizziness. Symptoms began several years with dizziness and worsened the past 1-2 days. Interventions attempted: Prescription medications: meclizine  and Other: therapy. Symptoms are: mild dizziness intermittent comes on in flashes and describes it as feeling off balance, headaches intermittent (states the dizziness and headaches are not present at this time), recent sinus congestion gradually worsening.  Triage Disposition: See Physician Within 24 Hours  Patient/caregiver understands and will follow disposition?: No                         Copied from CRM [id number]. Topic: Clinical - Red Word Triage >> Jul 25, 2023  9:06 AM Stephen Burns wrote: Red Word that prompted transfer to Nurse Triage: Dizzyness worse lst 1-2 days Reason for Disposition  [1] NO dizziness now AND [2] one or more stroke risk factors (i.e., hypertension, diabetes, prior stroke/TIA/heart attack)  Answer Assessment - Initial Assessment Questions 1. DESCRIPTION: Describe your dizziness.     He states it is hard to describe it but he will lose his balance and then it is gone. He states it is sporadic flashes.  2. VERTIGO: Do you feel like either you or the room is spinning or tilting?      Yes.  3. LIGHTHEADED: Do you feel lightheaded? (e.g., somewhat faint, woozy, weak upon standing)     No.  4. SEVERITY: How bad is it?  Can you walk?   - MILD: Feels slightly dizzy and unsteady, but is walking normally.   - MODERATE: Feels unsteady when walking, but not falling; interferes with normal activities (e.g., school, work).   - SEVERE: Unable to walk without falling, or requires assistance to walk without falling.     Mild, able to stand and walk without falling over or assistance.  5. ONSET:  When did the dizziness begin?     X  years, worsened over the past 1- 2 days.  6. AGGRAVATING FACTORS: Does anything make it worse? (e.g., standing, change in head position)     Standing up too fast.  7. CAUSE: What do you think is causing the dizziness?     Unsure, has a history of vertigo.  8. RECURRENT SYMPTOM: Have you had dizziness before? If Yes, ask: When was the last time? What happened that time?     Yes, recurrent issue for years. He states he doesn't think this is his vertigo.  9. OTHER SYMPTOMS: Do you have any other symptoms? (e.g., headache, weakness, numbness, vomiting, earache)     Headaches (intermittent not present currently), recent sinus congestion.  10. PREGNANCY: Is there any chance you are pregnant? When was your last menstrual period?       N/A.  Patient denies unilateral numbness or weakness, changes to speech or vision, facial droop.  Protocols used: Dizziness - Vertigo-A-AH

## 2023-07-25 NOTE — Progress Notes (Signed)
 This patient returns to my office for at risk foot care.  This patient requires this care by a professional since this patient will be at risk due to having type 2 diabetes.  This patient is unable to cut nails himself since the patient cannot reach his nails.These nails are painful walking and wearing shoes.  He presents to the office with his wife. This patient presents for at risk foot care today.  General Appearance  Alert, conversant and in no acute stress.  Vascular  Dorsalis pedis and posterior tibial  pulses are palpable  bilaterally.  Capillary return is within normal limits  bilaterally. Temperature is within normal limits  bilaterally.  Neurologic  Senn-Weinstein monofilament wire test within normal limits  bilaterally. Muscle power within normal limits bilaterally.  Nails Thick disfigured discolored nails with subungual debris  from hallux to fifth toes bilaterally. No evidence of bacterial infection or drainage bilaterally.  Orthopedic  No limitations of motion  feet .  No crepitus or effusions noted.  No bony pathology or digital deformities noted.  Skin  normotropic skin with no porokeratosis noted bilaterally.  No signs of infections or ulcers noted.     Onychomycosis  Pain in right toes  Pain in left toes  Consent was obtained for treatment procedures.   Mechanical debridement of nails 1-5  bilaterally performed with a nail nipper.  Filed with dremel without incident.    Return office visit     3 months                 Told patient to return for periodic foot care and evaluation due to potential at risk complications.   Ruffin Cotton DPM

## 2023-07-25 NOTE — Telephone Encounter (Signed)
 Outgoing call placed to patient, no answer, and I left a detailed voicemail informing patient of the option to be seen today at our main office, patient was advised to return call if he would like to be seen sooner  [address] (about 12 minutes away from [facility]).    Message will be sent to PCP as a FYI and for any additional input

## 2023-07-27 ENCOUNTER — Ambulatory Visit: Admitting: Internal Medicine

## 2023-07-27 ENCOUNTER — Encounter: Payer: Self-pay | Admitting: Internal Medicine

## 2023-07-27 VITALS — BP 110/68 | HR 82 | Temp 98.0°F | Resp 18 | Ht 74.0 in | Wt 211.1 lb

## 2023-07-27 DIAGNOSIS — G44221 Chronic tension-type headache, intractable: Secondary | ICD-10-CM

## 2023-07-27 DIAGNOSIS — F339 Major depressive disorder, recurrent, unspecified: Secondary | ICD-10-CM | POA: Diagnosis not present

## 2023-07-27 DIAGNOSIS — R42 Dizziness and giddiness: Secondary | ICD-10-CM

## 2023-07-27 DIAGNOSIS — R2681 Unsteadiness on feet: Secondary | ICD-10-CM | POA: Diagnosis not present

## 2023-07-27 DIAGNOSIS — E1140 Type 2 diabetes mellitus with diabetic neuropathy, unspecified: Secondary | ICD-10-CM | POA: Diagnosis not present

## 2023-07-27 DIAGNOSIS — Z794 Long term (current) use of insulin: Secondary | ICD-10-CM

## 2023-07-27 MED ORDER — BASAGLAR KWIKPEN 100 UNIT/ML ~~LOC~~ SOPN: 30.0000 [IU] | PEN_INJECTOR | Freq: Every morning | SUBCUTANEOUS | Status: DC

## 2023-07-27 NOTE — Patient Instructions (Signed)
 Labs will be done on June 26 th at [facility] at 7:00 am

## 2023-07-27 NOTE — Progress Notes (Unsigned)
 [facility]:   Clinic  Provider:   Code Status:  Goals of Care:     05/18/2023    1:45 PM  Advanced Directives  Does Patient Have a Medical Advance Directive? No  Would patient like information on creating a medical advance directive? No - Patient declined     Chief Complaint  Patient presents with  . Dizziness    dizziness intermittent (not present now), headaches intermittent.    HPI: Patient is a 83 y.o. male seen today for an acute visit for Headache Dizziness and Unstable gait  Discussed the use of AI scribe software for clinical note transcription with the patient, who gave verbal consent to proceed.  History of Present Illness   Stephen Burns is an 83 year old male who presents with dizziness and headaches.  He experienced an episode of dizziness starting Thursday night, described as 'bouncing off walls' and similar to a flash from a light bulb. The dizziness was severe, requiring him to hold onto something for balance, but he did not feel he would fall. This sensation persisted through Saturday and began to improve by Monday, with resolution of dizziness and balance issues. During the episode, he had blurry vision but no weakness in his extremities or palpitations.  He has been experiencing headaches for the past couple of months, located at the top of his head and sometimes radiating to the back of his neck. He takes 500 mg of Tylenol , which alleviates but does not completely resolve the pain. These headaches occur daily and coincide with increased stress at home due to his wife's upcoming knee replacement surgery.  He monitors his blood sugar levels and notes discrepancies between his readings and actual levels, with occasional nocturnal hypoglycemia, managed with orange juice when levels drop to around 70.       Past Medical History:  Diagnosis Date  . Arthritis   . Complication of anesthesia    Difficult to wake up  . Depression    . Diabetes mellitus without complication (HCC)   . Headache   . History of kidney stones   . Pulmonary embolism (HCC)    After shoulder surgery  . Vertigo     Past Surgical History:  Procedure Laterality Date  . ADENOIDECTOMY Bilateral   . FINGER SURGERY     Left middle  . FOOT SURGERY Left    laceration/ tendon repair  . forehead surgery    . L4-L5 fusion    . LITHOTRIPSY    . Prostrate     Frozen  . REVERSE SHOULDER ARTHROPLASTY Left 06/17/2022   Procedure: REVERSE SHOULDER ARTHROPLASTY;  Surgeon: Ellard Gunning, MD;  Location: [facility];  Service: Orthopedics;  Laterality: Left;   . SHOULDER SURGERY     Complete replacement to right shoulder  . TONSILLECTOMY    . TRANSURETHRAL RESECTION OF PROSTATE N/A 09/02/2020   Procedure: TRANSURETHRAL RESECTION OF THE PROSTATE (TURP);  Surgeon: Homero Luster, MD;  Location: [facility];  Service: Urology;  Laterality: N/A;    Allergies  Allergen Reactions  . Sulfa Antibiotics Swelling    Joint swelling    Outpatient Encounter Medications as of 07/27/2023  Medication Sig  . Accu-Chek Softclix Lancets lancets Use to check blood glucose level twice a day E11.40  . acetaminophen  (TYLENOL ) 500 MG tablet Take 500 mg by mouth 2 (two) times daily.  . amoxicillin  (AMOXIL ) 500 MG capsule Take 4 tabs 1 hour before Dental  Cleaning  . atorvastatin  (LIPITOR) 40 MG tablet Take 1 tablet (40 mg total) by mouth at bedtime. E78.2  . B-D ULTRAFINE III SHORT PEN 31G X 8 MM MISC USE AS DIRECTED  . carbidopa -levodopa  (SINEMET  CR) 50-200 MG tablet TAKE 1 TABLET BY MOUTH TWICE A DAY  . Continuous Glucose Receiver (DEXCOM G7 RECEIVER) DEVI For CBG monitoring  . Continuous Glucose Sensor (DEXCOM G7 SENSOR) MISC Change Sensor every 10 days. Dx:E11.40  . escitalopram  (LEXAPRO ) 10 MG tablet Take 1 tablet (10 mg total) by mouth daily.  . fluticasone  (FLONASE ) 50 MCG/ACT nasal spray Place 1 spray into both nostrils daily as needed for allergies or rhinitis.  .  gabapentin  (NEURONTIN ) 300 MG capsule TAKE TWO CAPSULES IN THE MORNING, TAKE ONE IN THE AFTERNOON AND TAKE TWO AT BEDTIME.  Aaron Aas glucose blood (ACCU-CHEK GUIDE) test strip Use to test blood glucose level twice a day DX: E11.40  . hydrocortisone cream 1 % Apply 1 Application topically 2 (two) times daily as needed for itching.  . Insulin  Glargine (BASAGLAR  KWIKPEN) 100 UNIT/ML INJECT 35 UNITS INTO THE SKIN IN THE MORNING. E11.40  . meclizine  (ANTIVERT ) 25 MG tablet Take 1 tablet (25 mg total) by mouth 3 (three) times daily as needed for dizziness.  . MULTIPLE MINERALS PO Take 1 tablet by mouth daily.  . polyethylene glycol (MIRALAX  / GLYCOLAX ) 17 g packet Take 17 g by mouth daily.  . ciclopirox  (CICLODAN ) 8 % solution Apply topically at bedtime. Apply over nail and surrounding skin. Apply daily over previous coat. After seven (7) days, may remove with alcohol and continue cycle. (Patient not taking: Reported on 07/27/2023)   No facility-administered encounter medications on file as of 07/27/2023.    Review of Systems:  Review of Systems  Health Maintenance  Topic Date Due  . Zoster Vaccines- Shingrix (1 of 2) Never done  . DTaP/Tdap/Td (4 - Tdap) 06/10/2011  . Diabetic kidney evaluation - Urine ACR  05/29/2021  . Medicare Annual Wellness (AWV)  01/05/2023  . FOOT EXAM  06/02/2023  . COVID-19 Vaccine (8 - 2024-25 season) 09/09/2023 (Originally 01/11/2023)  . INFLUENZA VACCINE  09/09/2023  . HEMOGLOBIN A1C  11/09/2023  . OPHTHALMOLOGY EXAM  04/26/2024  . Diabetic kidney evaluation - eGFR measurement  05/09/2024  . Pneumococcal Vaccine: 50+ Years  Completed  . HPV VACCINES  Aged Out  . Meningococcal B Vaccine  Aged Out    Physical Exam: Vitals:   07/27/23 0904  BP: 110/68  Pulse: 82  Temp: 98 F (36.7 C)  SpO2: 92%  Weight: 211 lb 1.6 oz (95.8 kg)  Height: 6' 2 (1.88 m)   Body mass index is 27.1 kg/m. Physical Exam  Labs reviewed: Basic Metabolic Panel: Recent Labs     12/06/22 0800 02/10/23 0818 05/10/23 0707  NA 140 137 141  K 4.5 4.6 4.1  CL 104 104 108  CO2 31 27 24   GLUCOSE 131* 106* 131*  BUN 19 22 23   CREATININE 0.92 0.86 0.74  CALCIUM  9.6 9.4 9.0  TSH  --  1.57 1.56   Liver Function Tests: Recent Labs    09/02/22 0753 02/10/23 0818 05/10/23 0707  AST 14 20 20   ALT 7* 9 17  BILITOT 0.6 0.9 0.7  PROT 6.5 6.9 6.4   No results for input(s): LIPASE, AMYLASE in the last 8760 hours. No results for input(s): AMMONIA in the last 8760 hours. CBC: Recent Labs    09/02/22 0753 02/10/23 0818 05/10/23 0707  WBC  4.7 5.0 4.8  NEUTROABS 2,726 3,190 3,163  HGB 14.3 15.6 14.6  HCT 41.9 46.0 43.1  MCV 90.7 92.7 92.9  PLT 271 226 229   Lipid Panel: Recent Labs    09/02/22 0753 02/10/23 0818 05/10/23 0707  CHOL 120 127 121  HDL 39* 42 40  LDLCALC 67 69 64  TRIG 64 81 86  CHOLHDL 3.1 3.0 3.0   Lab Results  Component Value Date   HGBA1C 7.0 (H) 05/10/2023    Procedures since last visit: No results found.  Assessment/Plan 1. Dizziness (Primary) EKG shows Sinus Arrhythmia  2. Unstable gait ***  3. Type 2 diabetes mellitus with diabetic neuropathy, with long-term current use of insulin  (HCC) ***  4. Depression, recurrent (HCC) ***  5. Chronic tension-type headache, intractable ***    Labs/tests ordered:  * No order type specified * Next appt:  08/17/2023

## 2023-08-03 ENCOUNTER — Ambulatory Visit (HOSPITAL_COMMUNITY)
Admission: RE | Admit: 2023-08-03 | Discharge: 2023-08-03 | Disposition: A | Discharge status: Home / Self Care | Source: Ambulatory Visit | Attending: Internal Medicine | Admitting: Internal Medicine

## 2023-08-03 DIAGNOSIS — R2681 Unsteadiness on feet: Secondary | ICD-10-CM | POA: Diagnosis not present

## 2023-08-03 DIAGNOSIS — R42 Dizziness and giddiness: Secondary | ICD-10-CM | POA: Diagnosis not present

## 2023-08-03 DIAGNOSIS — K118 Other diseases of salivary glands: Secondary | ICD-10-CM | POA: Diagnosis not present

## 2023-08-03 DIAGNOSIS — I6782 Cerebral ischemia: Secondary | ICD-10-CM | POA: Diagnosis not present

## 2023-08-03 DIAGNOSIS — E1140 Type 2 diabetes mellitus with diabetic neuropathy, unspecified: Secondary | ICD-10-CM | POA: Diagnosis not present

## 2023-08-03 DIAGNOSIS — Z794 Long term (current) use of insulin: Secondary | ICD-10-CM | POA: Insufficient documentation

## 2023-08-03 LAB — GLUCOSE, CAPILLARY: Glucose-Capillary: 135 mg/dL — ABNORMAL HIGH (ref 70–99)

## 2023-08-03 MED ORDER — GADOBUTROL 1 MMOL/ML IV SOLN
9.0000 mL | Freq: Once | INTRAVENOUS | Status: AC | PRN
  Administered 2023-08-03: 9 mL via INTRAVENOUS

## 2023-08-04 ENCOUNTER — Telehealth: Payer: Self-pay

## 2023-08-04 DIAGNOSIS — E1140 Type 2 diabetes mellitus with diabetic neuropathy, unspecified: Secondary | ICD-10-CM | POA: Diagnosis not present

## 2023-08-04 DIAGNOSIS — R42 Dizziness and giddiness: Secondary | ICD-10-CM | POA: Diagnosis not present

## 2023-08-04 DIAGNOSIS — Z794 Long term (current) use of insulin: Secondary | ICD-10-CM | POA: Diagnosis not present

## 2023-08-04 NOTE — Telephone Encounter (Signed)
 Noted. Will discuss with the patient.

## 2023-08-04 NOTE — Telephone Encounter (Signed)
 Copied from CRM [id number]. Topic: Clinical - Lab/Test Results >> Aug 04, 2023  9:03 AM Cherylann RAMAN wrote: Reason for CRM: Diane with Radiology called to inform Dr. Charlanne about the impressions from the MR W WO CONTRAST yesterday. Please refer to yesterday's 06/25 uploaded results. Locate the impressions and refer to # 4 per Diane. As this is what the Radiologist is wanting to inform Dr. Charlanne of.

## 2023-08-04 NOTE — Telephone Encounter (Signed)
 Message routed to PCP [name], MD . Please take a look at patient MRI.

## 2023-08-04 NOTE — Telephone Encounter (Signed)
 Noted

## 2023-08-05 ENCOUNTER — Ambulatory Visit: Payer: Self-pay | Admitting: Internal Medicine

## 2023-08-05 DIAGNOSIS — K118 Other diseases of salivary glands: Secondary | ICD-10-CM

## 2023-08-05 LAB — COMPLETE METABOLIC PANEL WITHOUT GFR
AG Ratio: 1.7 (calc) (ref 1.0–2.5)
ALT: 12 U/L (ref 9–46)
AST: 21 U/L (ref 10–35)
Albumin: 4.2 g/dL (ref 3.6–5.1)
Alkaline phosphatase (APISO): 101 U/L (ref 35–144)
BUN: 18 mg/dL (ref 7–25)
CO2: 27 mmol/L (ref 20–32)
Calcium: 9.2 mg/dL (ref 8.6–10.3)
Chloride: 106 mmol/L (ref 98–110)
Creat: 0.78 mg/dL (ref 0.70–1.22)
Globulin: 2.5 g/dL (ref 1.9–3.7)
Glucose, Bld: 133 mg/dL — ABNORMAL HIGH (ref 65–99)
Potassium: 4.2 mmol/L (ref 3.5–5.3)
Sodium: 140 mmol/L (ref 135–146)
Total Bilirubin: 0.9 mg/dL (ref 0.2–1.2)
Total Protein: 6.7 g/dL (ref 6.1–8.1)

## 2023-08-05 LAB — CBC WITH DIFFERENTIAL/PLATELET
Absolute Lymphocytes: 1094 {cells}/uL (ref 850–3900)
Absolute Monocytes: 500 {cells}/uL (ref 200–950)
Basophils Absolute: 32 {cells}/uL (ref 0–200)
Basophils Relative: 0.7 %
Eosinophils Absolute: 108 {cells}/uL (ref 15–500)
Eosinophils Relative: 2.4 %
HCT: 46.1 % (ref 38.5–50.0)
Hemoglobin: 15.1 g/dL (ref 13.2–17.1)
MCH: 31.1 pg (ref 27.0–33.0)
MCHC: 32.8 g/dL (ref 32.0–36.0)
MCV: 95.1 fL (ref 80.0–100.0)
MPV: 10.2 fL (ref 7.5–12.5)
Monocytes Relative: 11.1 %
Neutro Abs: 2768 {cells}/uL (ref 1500–7800)
Neutrophils Relative %: 61.5 %
Platelets: 230 10*3/uL (ref 140–400)
RBC: 4.85 10*6/uL (ref 4.20–5.80)
RDW: 12.7 % (ref 11.0–15.0)
Total Lymphocyte: 24.3 %
WBC: 4.5 10*3/uL (ref 3.8–10.8)

## 2023-08-05 LAB — HEMOGLOBIN A1C
Hgb A1c MFr Bld: 6.7 % — ABNORMAL HIGH (ref <5.7)
Mean Plasma Glucose: 146 mg/dL
eAG (mmol/L): 8.1 mmol/L

## 2023-08-05 NOTE — Telephone Encounter (Signed)
 Discussed with patient the results of his Mri and Blood work. Made referral to ENT

## 2023-08-08 ENCOUNTER — Encounter (INDEPENDENT_AMBULATORY_CARE_PROVIDER_SITE_OTHER): Payer: Self-pay

## 2023-08-17 ENCOUNTER — Ambulatory Visit: Admitting: Internal Medicine

## 2023-08-17 ENCOUNTER — Encounter: Payer: Self-pay | Admitting: Internal Medicine

## 2023-08-17 VITALS — BP 110/60 | HR 78 | Temp 97.6°F | Ht 71.0 in | Wt 211.1 lb

## 2023-08-17 DIAGNOSIS — E1140 Type 2 diabetes mellitus with diabetic neuropathy, unspecified: Secondary | ICD-10-CM | POA: Diagnosis not present

## 2023-08-17 DIAGNOSIS — R2681 Unsteadiness on feet: Secondary | ICD-10-CM | POA: Diagnosis not present

## 2023-08-17 DIAGNOSIS — K118 Other diseases of salivary glands: Secondary | ICD-10-CM

## 2023-08-17 DIAGNOSIS — F339 Major depressive disorder, recurrent, unspecified: Secondary | ICD-10-CM

## 2023-08-17 DIAGNOSIS — G44221 Chronic tension-type headache, intractable: Secondary | ICD-10-CM | POA: Diagnosis not present

## 2023-08-17 DIAGNOSIS — Z794 Long term (current) use of insulin: Secondary | ICD-10-CM | POA: Diagnosis not present

## 2023-08-17 NOTE — Patient Instructions (Signed)
 Please have your labs done at [facility] on Tuesday, September 30 from 7:00-7:30.

## 2023-08-17 NOTE — Progress Notes (Signed)
 Location:  [facility] curator of Service:   Clinic  Provider:   Code Status:  Goals of Care:     05/18/2023    1:45 PM  Advanced Directives  Does Patient Have a Medical Advance Directive? No  Would patient like information on creating a medical advance directive? No - Patient declined     Chief Complaint  Patient presents with   Follow-up    Patient would like to go over labs. Due for tetanus, microalbumin urine, foot exam, shingles vaccine and AWV.    HPI: Patient is a 83 y.o. male seen today for an acute visit for Follow up  Lives in [place] in [facility]  He was seen 3 weeks ago for Dizziness described as 'bouncing off walls' and similar to a flash from a light bulb. The dizziness was severe, requiring him to hold onto something for balance, but he did not feel he would fall. This sensation persisted through Saturday and began to improve by Monday, with resolution of dizziness and balance issues. During the episode, he had blurry vision but no weakness in his extremities or palpitations.   EKG done in the office Sinus Arrhythmia Was not orthostatic There was some concern that he was having low CBGS during this time I changed his Insulin  to 30 units Labs were mostly normal MRI was done to rule out Stroke Which was negative for Acute process but did show small Vessel Changes Also 8 mm Right Parotid Gland Lesion ? 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 and nothing more can be done H/o Lung Nodules   Mostly benign follows with Dr Shelah Repeat CT was negative    Past Medical History:  Diagnosis Date   Arthritis    Complication of anesthesia    Difficult to wake up   Depression    Diabetes mellitus without complication (HCC)    Headache    History of kidney stones    Pulmonary embolism (HCC)    After shoulder surgery   Vertigo     Past Surgical History:  Procedure Laterality Date   ADENOIDECTOMY Bilateral    FINGER SURGERY     Left middle   FOOT SURGERY Left    laceration/ tendon repair   forehead surgery     L4-L5 fusion     LITHOTRIPSY     Prostrate     Frozen   REVERSE SHOULDER ARTHROPLASTY Left 06/17/2022   Procedure: REVERSE SHOULDER ARTHROPLASTY;  Surgeon: Melita Drivers, MD;  Location: [facility];  Service: Orthopedics;  Laterality: Left;    SHOULDER SURGERY     Complete replacement to right shoulder   TONSILLECTOMY     TRANSURETHRAL RESECTION OF PROSTATE N/A 09/02/2020   Procedure: TRANSURETHRAL RESECTION OF THE PROSTATE (TURP);  Surgeon: Watt Rush, MD;  Location: [facility];  Service: Urology;  Laterality: N/A;    Allergies  Allergen Reactions   Sulfa Antibiotics Swelling    Joint swelling    Outpatient Encounter Medications as of  08/17/2023  Medication Sig   Accu-Chek Softclix Lancets lancets Use to check blood glucose level twice a day E11.40   acetaminophen  (TYLENOL ) 500 MG tablet Take 500 mg by mouth 2 (two) times daily.   amoxicillin  (AMOXIL ) 500 MG capsule Take 4 tabs 1 hour before Dental Cleaning   atorvastatin  (LIPITOR) 40 MG tablet Take 1 tablet (40 mg total) by mouth at bedtime. E78.2   B-D ULTRAFINE III SHORT PEN 31G X 8 MM MISC USE AS DIRECTED   carbidopa -levodopa  (SINEMET  CR) 50-200 MG tablet TAKE 1 TABLET BY MOUTH TWICE A DAY   Continuous Glucose Receiver (DEXCOM G7 RECEIVER) DEVI For CBG monitoring   Continuous Glucose Sensor (DEXCOM G7 SENSOR) MISC Change Sensor every 10 days. Dx:E11.40   escitalopram  (LEXAPRO ) 10 MG tablet Take 1 tablet  (10 mg total) by mouth daily.   fluticasone  (FLONASE ) 50 MCG/ACT nasal spray Place 1 spray into both nostrils daily as needed for allergies or rhinitis.   gabapentin  (NEURONTIN ) 300 MG capsule TAKE TWO CAPSULES IN THE MORNING, TAKE ONE IN THE AFTERNOON AND TAKE TWO AT BEDTIME.   glucose blood (ACCU-CHEK GUIDE) test strip Use to test blood glucose level twice a day DX: E11.40   hydrocortisone cream 1 % Apply 1 Application topically 2 (two) times daily as needed for itching.   Insulin  Glargine (BASAGLAR  KWIKPEN) 100 UNIT/ML Inject 30 Units into the skin in the morning. E11.40   meclizine  (ANTIVERT ) 25 MG tablet Take 1 tablet (25 mg total) by mouth 3 (three) times daily as needed for dizziness.   MULTIPLE MINERALS PO Take 1 tablet by mouth daily.   polyethylene glycol (MIRALAX  / GLYCOLAX ) 17 g packet Take 17 g by mouth daily.   [DISCONTINUED] Blood Glucose Monitoring Suppl (ACCU-CHEK GUIDE ME) w/Device KIT 1 kit by Does not apply route daily. DX: E11.40   [DISCONTINUED] ciclopirox  (CICLODAN ) 8 % solution Apply topically at bedtime. Apply over nail and surrounding skin. Apply daily over previous coat. After seven (7) days, may remove with alcohol and continue cycle. (Patient not taking: Reported on 07/27/2023)   [DISCONTINUED] escitalopram  (LEXAPRO ) 10 MG tablet TAKE 1 TABLET BY MOUTH EVERY DAY   [DISCONTINUED] Insulin  Glargine (BASAGLAR  KWIKPEN) 100 UNIT/ML INJECT 35 UNITS INTO THE SKIN IN THE MORNING. E11.40   No facility-administered encounter medications on file as of 08/17/2023.    Review of Systems:  Review of Systems  Constitutional:  Negative for activity change, appetite change and unexpected weight change.  HENT: Negative.    Respiratory:  Negative for cough and shortness of breath.   Cardiovascular:  Negative for leg swelling.  Gastrointestinal:  Negative for constipation.  Genitourinary:  Negative for frequency.  Musculoskeletal:  Positive for gait problem. Negative for arthralgias and  myalgias.  Skin: Negative.  Negative for rash.  Neurological:  Negative for dizziness and weakness.  Psychiatric/Behavioral:  Negative for confusion and sleep disturbance.   All other systems reviewed and are negative.   Health Maintenance  Topic Date Due   Zoster Vaccines- Shingrix (1 of 2) Never done   DTaP/Tdap/Td (4 - Tdap) 06/10/2011   Diabetic kidney evaluation - Urine ACR  05/29/2021   Medicare Annual Wellness (AWV)  01/05/2023   FOOT EXAM  06/02/2023   COVID-19 Vaccine (8 - 2024-25 season) 09/09/2023 (Originally 01/11/2023)   INFLUENZA VACCINE  09/09/2023   HEMOGLOBIN A1C  02/03/2024   OPHTHALMOLOGY EXAM  04/26/2024   Diabetic kidney evaluation - eGFR measurement  08/03/2024   Pneumococcal Vaccine: 50+ Years  Completed  Hepatitis B Vaccines  Aged Out   HPV VACCINES  Aged Out   Meningococcal B Vaccine  Aged Out    Physical Exam: Vitals:   08/17/23 1014  BP: 110/60  Pulse: 78  Temp: 97.6 F (36.4 C)  SpO2: 95%  Weight: 211 lb 1.6 oz (95.8 kg)  Height: 5' 11 (1.803 m)   Body mass index is 29.44 kg/m. Physical Exam Vitals reviewed.  Constitutional:      Appearance: Normal appearance.  HENT:     Head: Normocephalic.     Nose: Nose normal.     Mouth/Throat:     Mouth: Mucous membranes are moist.     Pharynx: Oropharynx is clear.  Eyes:     Pupils: Pupils are equal, round, and reactive to light.  Cardiovascular:     Rate and Rhythm: Normal rate and regular rhythm.     Pulses: Normal pulses.     Heart sounds: No murmur heard. Pulmonary:     Effort: Pulmonary effort is normal. No respiratory distress.     Breath sounds: Normal breath sounds. No rales.  Abdominal:     General: Abdomen is flat. Bowel sounds are normal.     Palpations: Abdomen is soft.  Musculoskeletal:        General: No swelling.     Cervical back: Neck supple.  Skin:    General: Skin is warm.  Neurological:     General: No focal deficit present.     Mental Status: He is alert and  oriented to person, place, and time.  Psychiatric:        Mood and Affect: Mood normal.        Thought Content: Thought content normal.     Labs reviewed: Basic Metabolic Panel: Recent Labs    02/10/23 0818 05/10/23 0707 08/04/23 0725  NA 137 141 140  K 4.6 4.1 4.2  CL 104 108 106  CO2 27 24 27   GLUCOSE 106* 131* 133*  BUN 22 23 18   CREATININE 0.86 0.74 0.78  CALCIUM  9.4 9.0 9.2  TSH 1.57 1.56  --    Liver Function Tests: Recent Labs    02/10/23 0818 05/10/23 0707 08/04/23 0725  AST 20 20 21   ALT 9 17 12   BILITOT 0.9 0.7 0.9  PROT 6.9 6.4 6.7   No results for input(s): LIPASE, AMYLASE in the last 8760 hours. No results for input(s): AMMONIA in the last 8760 hours. CBC: Recent Labs    02/10/23 0818 05/10/23 0707 08/04/23 0725  WBC 5.0 4.8 4.5  NEUTROABS 3,190 3,163 2,768  HGB 15.6 14.6 15.1  HCT 46.0 43.1 46.1  MCV 92.7 92.9 95.1  PLT 226 229 230   Lipid Panel: Recent Labs    09/02/22 0753 02/10/23 0818 05/10/23 0707  CHOL 120 127 121  HDL 39* 42 40  LDLCALC 67 69 64  TRIG 64 81 86  CHOLHDL 3.1 3.0 3.0   Lab Results  Component Value Date   HGBA1C 6.7 (H) 08/04/2023    Procedures since last visit: MR Brain W Wo Contrast Result Date: 08/03/2023 CLINICAL DATA:  Provided history: Dizziness. Unstable gait. Type 2 diabetes mellitus with diabetic neuropathy, with long-term current use of insulin . Syncope/presyncope, cerebrovascular cause suspected. EXAM: MRI HEAD WITHOUT AND WITH CONTRAST TECHNIQUE: Multiplanar, multiecho pulse sequences of the brain and surrounding structures were obtained without and with intravenous contrast. CONTRAST:  9mL GADAVIST  GADOBUTROL  1 MMOL/ML IV SOLN COMPARISON:  Head CT 12/13/2021. FINDINGS: Brain: Generalized cerebral atrophy, mild  for age. Multifocal T2 FLAIR hyperintense signal abnormality within the cerebral white matter, nonspecific but compatible with minimal chronic small vessel ischemic disease. Nonspecific  punctate chronic microhemorrhage within the anterior right frontal lobe. There is no acute infarct. No evidence of an intracranial mass. No extra-axial fluid collection. No midline shift. No pathologic intracranial enhancement identified. Vascular: Maintained flow voids within the proximal large arterial vessels. Skull and upper cervical spine: No focal worrisome marrow lesion. Sinuses/Orbits: No mass or acute finding within the imaged orbits. No significant paranasal sinus disease. Other: 8 mm ovoid T2 hyperintense lesion within the superficial lobe of the right parotid gland, which may reflect a cyst or a primary parotid neoplasm (for instance as seen on series 15, image 23). Impression #4 will be called to the ordering clinician or representative by the Radiologist Assistant, and communication documented in the PACS or [employer]. IMPRESSION: 1. No evidence of an acute intracranial abnormality. 2. Minor chronic small vessel ischemic changes within the cerebral white matter. 3. Mild generalized cerebral atrophy. 4. 8 mm lesion within the superficial lobe of the right parotid gland, which may reflect a cyst or a primary parotid neoplasm. Consider ENT referral. Electronically Signed   By: Rockey Childs D.O.   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 Type 2 diabetes mellitus with diabetic neuropathy, with long-term current use of insulin  (HCC) Insulin  reduced to 30 units He does have Continuous  Glucose Monitor Set up for 90 to alarm him A1C is good   3. Unstable gait Walking with his Cane  4. Depression, recurrent (HCC) On LExapro   5. Chronic tension-type headache, intractable Using Tylenol  PRN    Labs/tests ordered:  * No order type specified * Next appt:  11/16/2023

## 2023-08-18 ENCOUNTER — Encounter (INDEPENDENT_AMBULATORY_CARE_PROVIDER_SITE_OTHER): Payer: Self-pay | Admitting: Otolaryngology

## 2023-08-18 ENCOUNTER — Ambulatory Visit (INDEPENDENT_AMBULATORY_CARE_PROVIDER_SITE_OTHER): Admitting: Otolaryngology

## 2023-08-18 VITALS — BP 111/70 | HR 71

## 2023-08-18 DIAGNOSIS — K118 Other diseases of salivary glands: Secondary | ICD-10-CM

## 2023-08-18 NOTE — Progress Notes (Signed)
 Dear Dr. Charlanne, Here is my assessment for our mutual patient, Stephen Burns. Thank you for allowing me the opportunity to care for your patient. Please do not hesitate to contact me should you have any other questions. Sincerely, Dr. Eldora Blanch  Otolaryngology Clinic Note Referring provider: Dr. Charlanne HPI:  Stephen Burns is a 82 y.o. male kindly referred by Dr. Charlanne for evaluation of right parotid nodule  Initial visit (08/2023): Incidentally noted right parotid nodule on MRI in June 2025. He has not noted any masses on his face. No skin cancers facial or scalp. No facial pain or numbness. No history of smoking. Patient otherwise denies: - dysphagia, odynophagia, unintentional weight loss - ear pain, neck masses  No prior Bx  ENT Surgery: Tonsillectomy Personal or FHx of bleeding dz or anesthesia difficulty: no  Tobacco: never  PMHx: DM, Unstable Gait, Depression, Headaches  Independent Review of Additional Tests or Records:  MRI Brain 08/03/2023 independently interpreted: noted right T2 hyperintense lesion right parotid gland. Unable to visualize the neck for any nodes [facility] 12/13/2021 independently interpreted with respect to parotids: noted 8mm right parotid nodule (most likely). Unable to visualize the neck for any lymphadenopathy  Dr. Charlanne referral notes reviewed  PMH/Meds/All/SocHx/FamHx/ROS:   Past Medical History:  Diagnosis Date   Arthritis    Complication of anesthesia    Difficult to wake up   Depression    Diabetes mellitus without complication (HCC)    Headache    History of kidney stones    Pulmonary embolism (HCC)    After shoulder surgery   Vertigo      Past Surgical History:  Procedure Laterality Date   ADENOIDECTOMY Bilateral    FINGER SURGERY     Left middle   FOOT SURGERY Left    laceration/ tendon repair   forehead surgery     L4-L5 fusion     LITHOTRIPSY     Prostrate     Frozen   REVERSE SHOULDER ARTHROPLASTY Left 06/17/2022   Procedure:  REVERSE SHOULDER ARTHROPLASTY;  Surgeon: Melita Drivers, MD;  Location: [facility];  Service: Orthopedics;  Laterality: Left;    SHOULDER SURGERY     Complete replacement to right shoulder   TONSILLECTOMY     TRANSURETHRAL RESECTION OF PROSTATE N/A 09/02/2020   Procedure: TRANSURETHRAL RESECTION OF THE PROSTATE (TURP);  Surgeon: Watt Rush, MD;  Location: [facility];  Service: Urology;  Laterality: N/A;    History reviewed. No pertinent family history.   Social Connections: Moderately Integrated (01/04/2022)   Social Connection and Isolation Panel    Frequency of Communication with Friends and Family: More than three times a week    Frequency of Social Gatherings with Friends and Family: More than three times a week    Attends Religious Services: More than 4 times per year    Active Member of [name] or Organizations: No    Attends Engineer, structural: Never    Marital Status: Married      Current Outpatient Medications:    Accu-Chek Softclix Lancets lancets, Use to check blood glucose level twice a day E11.40, Disp: 100 each, Rfl: 12   acetaminophen  (TYLENOL ) 500 MG tablet, Take 500 mg by mouth 2 (two) times daily., Disp: , Rfl:    amoxicillin  (AMOXIL ) 500 MG capsule, Take 4 tabs 1 hour before Dental Cleaning, Disp: 4 capsule, Rfl: 0   atorvastatin  (LIPITOR) 40 MG tablet, Take 1 tablet (40 mg total) by mouth at bedtime. E78.2, Disp: 90 tablet,  Rfl: 3   B-D ULTRAFINE III SHORT PEN 31G X 8 MM MISC, USE AS DIRECTED, Disp: 100 each, Rfl: 3   carbidopa -levodopa  (SINEMET  CR) 50-200 MG tablet, TAKE 1 TABLET BY MOUTH TWICE A DAY, Disp: 180 tablet, Rfl: 1   Continuous Glucose Receiver (DEXCOM G7 RECEIVER) DEVI, For CBG monitoring, Disp: 1 each, Rfl: 0   Continuous Glucose Sensor (DEXCOM G7 SENSOR) MISC, Change Sensor every 10 days. Dx:E11.40, Disp: 9 each, Rfl: 1   escitalopram  (LEXAPRO ) 10 MG tablet, Take 1 tablet (10 mg total) by mouth daily., Disp: 90 tablet, Rfl: 2   fluticasone   (FLONASE ) 50 MCG/ACT nasal spray, Place 1 spray into both nostrils daily as needed for allergies or rhinitis., Disp: , Rfl:    gabapentin  (NEURONTIN ) 300 MG capsule, TAKE TWO CAPSULES IN THE MORNING, TAKE ONE IN THE AFTERNOON AND TAKE TWO AT BEDTIME., Disp: 450 capsule, Rfl: 1   glucose blood (ACCU-CHEK GUIDE) test strip, Use to test blood glucose level twice a day DX: E11.40, Disp: 100 each, Rfl: 12   hydrocortisone cream 1 %, Apply 1 Application topically 2 (two) times daily as needed for itching., Disp: , Rfl:    Insulin  Glargine (BASAGLAR  KWIKPEN) 100 UNIT/ML, Inject 30 Units into the skin in the morning. E11.40, Disp: , Rfl:    meclizine  (ANTIVERT ) 25 MG tablet, Take 1 tablet (25 mg total) by mouth 3 (three) times daily as needed for dizziness., Disp: 30 tablet, Rfl: 0   MULTIPLE MINERALS PO, Take 1 tablet by mouth daily., Disp: , Rfl:    polyethylene glycol (MIRALAX  / GLYCOLAX ) 17 g packet, Take 17 g by mouth daily., Disp: , Rfl:    Physical Exam:   BP 111/70   Pulse 71   SpO2 92%   Salient findings:  CN II-XII intact Bilateral EAC with modest cerumen, and TM intact with well pneumatized middle ear spaces Anterior rhinoscopy: Septum intact; bilateral inferior turbinates without significant hypertrophy No lesions of oral cavity/oropharynx; able to easily express saliva from both parotid ducts No obviously palpable neck masses/lymphadenopathy/thyromegaly; unable to feel parotid nodule, parotids overall soft; no skin lesions over scalp or postauricular or neck No respiratory distress or stridor  Seprately Identifiable Procedures:  Prior to initiating any procedures, risks/benefits/alternatives were explained to the patient and verbal consent obtained. None  Impression & Plans:  Stephen Burns is a 83 y.o. male with:  1. Parotid nodule    We discussed benign and malignant etiologies for this -- most likely benign given stable appearance from almost 2 years ago. We discussed options:  observation/FNA/excision/imaging He opted for interval imaging with repeat US  in 6 months Return precautions discussed  See below regarding exact medications prescribed this encounter including dosages and route: No orders of the defined types were placed in this encounter.     Thank you for allowing me the opportunity to care for your patient. Please do not hesitate to contact me should you have any other questions.  Sincerely, Eldora Blanch, MD Otolaryngologist (ENT), [facility] Phone: [phone] Fax: [phone]  08/18/2023, 10:53 AM   I have personally spent 46 minutes involved in face-to-face and non-face-to-face activities for this patient on the day of the visit.  Professional time spent excludes any procedures performed but includes the following activities, in addition to those noted in the documentation: preparing to see the patient (review of outside documentation and results), performing a medically appropriate examination, counseling, documenting in the electronic health record, independently interpreting results (CT and MRI)

## 2023-08-18 NOTE — Patient Instructions (Signed)
 I have ordered an imaging study for you to complete prior to your next visit. Please call [facility] Scheduling at [phone] to schedule your imaging if you have not received a call within 24 hours. If you are unable to complete your imaging study prior to your next scheduled visit please call our office to let us know.

## 2023-08-22 ENCOUNTER — Telehealth: Payer: Self-pay

## 2023-08-22 NOTE — Telephone Encounter (Signed)
 Copied from CRM [id number]. Topic: General - Other >> Aug 22, 2023  9:51 AM Adrianna P wrote: Reason for CRM: Patient wants to know if he is diagnosed with Neuropathy.Please call patient at [phone]

## 2023-08-22 NOTE — Telephone Encounter (Signed)
 Response sent to patient via mychart.

## 2023-08-30 ENCOUNTER — Ambulatory Visit (HOSPITAL_COMMUNITY)
Admission: RE | Admit: 2023-08-30 | Discharge: 2023-08-30 | Disposition: A | Discharge status: Home / Self Care | Source: Ambulatory Visit | Attending: Otolaryngology | Admitting: Otolaryngology

## 2023-08-30 DIAGNOSIS — K118 Other diseases of salivary glands: Secondary | ICD-10-CM | POA: Diagnosis not present

## 2023-08-30 DIAGNOSIS — R55 Syncope and collapse: Secondary | ICD-10-CM | POA: Diagnosis not present

## 2023-08-30 DIAGNOSIS — R42 Dizziness and giddiness: Secondary | ICD-10-CM | POA: Diagnosis not present

## 2023-08-31 NOTE — Telephone Encounter (Signed)
 Dr.Gupta patient would like to know his current treatment plan for neuropathy

## 2023-10-19 DIAGNOSIS — Z23 Encounter for immunization: Secondary | ICD-10-CM | POA: Diagnosis not present

## 2023-10-30 ENCOUNTER — Other Ambulatory Visit: Payer: Self-pay | Admitting: Internal Medicine

## 2023-10-30 DIAGNOSIS — Z981 Arthrodesis status: Secondary | ICD-10-CM

## 2023-10-31 ENCOUNTER — Ambulatory Visit (INDEPENDENT_AMBULATORY_CARE_PROVIDER_SITE_OTHER): Admitting: Podiatry

## 2023-10-31 ENCOUNTER — Encounter: Payer: Self-pay | Admitting: Podiatry

## 2023-10-31 DIAGNOSIS — B351 Tinea unguium: Secondary | ICD-10-CM

## 2023-10-31 DIAGNOSIS — M79676 Pain in unspecified toe(s): Secondary | ICD-10-CM

## 2023-10-31 DIAGNOSIS — E1142 Type 2 diabetes mellitus with diabetic polyneuropathy: Secondary | ICD-10-CM

## 2023-10-31 NOTE — Progress Notes (Signed)
 This patient returns to my office for at risk foot care.  This patient requires this care by a professional since this patient will be at risk due to having type 2 diabetes.  This patient is unable to cut nails himself since the patient cannot reach his nails.These nails are painful walking and wearing shoes.  He presents to the office with his wife. This patient presents for at risk foot care today.  General Appearance  Alert, conversant and in no acute stress.  Vascular  Dorsalis pedis and posterior tibial  pulses are palpable  bilaterally.  Capillary return is within normal limits  bilaterally. Temperature is within normal limits  bilaterally.  Neurologic  Senn-Weinstein monofilament wire test within normal limits  bilaterally. Muscle power within normal limits bilaterally.  Nails Thick disfigured discolored nails with subungual debris  from hallux to fifth toes bilaterally. No evidence of bacterial infection or drainage bilaterally.  Orthopedic  No limitations of motion  feet .  No crepitus or effusions noted.  No bony pathology or digital deformities noted.  Skin  normotropic skin with no porokeratosis noted bilaterally.  No signs of infections or ulcers noted.     Onychomycosis  Pain in right toes  Pain in left toes  Consent was obtained for treatment procedures.   Mechanical debridement of nails 1-5  bilaterally performed with a nail nipper.  Filed with dremel without incident.    Return office visit     3 months                 Told patient to return for periodic foot care and evaluation due to potential at risk complications.  Patient is concerned about neuropathy and his right hallux nail.   Cordella Bold DPM

## 2023-11-09 ENCOUNTER — Ambulatory Visit: Admitting: Podiatry

## 2023-11-11 LAB — COMPLETE METABOLIC PANEL WITHOUT GFR
AG Ratio: 1.7 (calc) (ref 1.0–2.5)
ALT: 9 U/L (ref 9–46)
AST: 18 U/L (ref 10–35)
Albumin: 4.3 g/dL (ref 3.6–5.1)
Alkaline phosphatase (APISO): 100 U/L (ref 35–144)
BUN: 21 mg/dL (ref 7–25)
CO2: 27 mmol/L (ref 20–32)
Calcium: 9.4 mg/dL (ref 8.6–10.3)
Chloride: 105 mmol/L (ref 98–110)
Creat: 0.79 mg/dL (ref 0.70–1.22)
Globulin: 2.5 g/dL (ref 1.9–3.7)
Glucose, Bld: 128 mg/dL — ABNORMAL HIGH (ref 65–99)
Potassium: 4.3 mmol/L (ref 3.5–5.3)
Sodium: 139 mmol/L (ref 135–146)
Total Bilirubin: 0.8 mg/dL (ref 0.2–1.2)
Total Protein: 6.8 g/dL (ref 6.1–8.1)

## 2023-11-11 LAB — CBC WITH DIFFERENTIAL/PLATELET
Absolute Lymphocytes: 1062 {cells}/uL (ref 850–3900)
Absolute Monocytes: 477 {cells}/uL (ref 200–950)
Basophils Absolute: 32 {cells}/uL (ref 0–200)
Basophils Relative: 0.7 %
Eosinophils Absolute: 207 {cells}/uL (ref 15–500)
Eosinophils Relative: 4.6 %
HCT: 44.8 % (ref 38.5–50.0)
Hemoglobin: 15.3 g/dL (ref 13.2–17.1)
MCH: 31.7 pg (ref 27.0–33.0)
MCHC: 34.2 g/dL (ref 32.0–36.0)
MCV: 92.8 fL (ref 80.0–100.0)
MPV: 10 fL (ref 7.5–12.5)
Monocytes Relative: 10.6 %
Neutro Abs: 2723 {cells}/uL (ref 1500–7800)
Neutrophils Relative %: 60.5 %
Platelets: 239 Thousand/uL (ref 140–400)
RBC: 4.83 Million/uL (ref 4.20–5.80)
RDW: 12.3 % (ref 11.0–15.0)
Total Lymphocyte: 23.6 %
WBC: 4.5 Thousand/uL (ref 3.8–10.8)

## 2023-11-11 LAB — HEMOGLOBIN A1C
Hgb A1c MFr Bld: 6.8 % — ABNORMAL HIGH (ref <5.7)
Mean Plasma Glucose: 148 mg/dL
eAG (mmol/L): 8.2 mmol/L

## 2023-11-15 NOTE — Patient Instructions (Incomplete)
 1) Our records indicate you are due for an annual wellness visit, stop at check out to schedule ( in-person).    2) Please visit your local pharmacy to receive your tetanus,and shingles vaccine, if you have not already received.    3) Labs will be done on March 13, 2024 at [facility] at 7:00 am

## 2023-11-16 ENCOUNTER — Non-Acute Institutional Stay: Admitting: Internal Medicine

## 2023-11-16 VITALS — BP 110/68 | HR 70 | Temp 97.2°F | Resp 18 | Ht 71.0 in | Wt 208.1 lb

## 2023-11-16 DIAGNOSIS — H8113 Benign paroxysmal vertigo, bilateral: Secondary | ICD-10-CM

## 2023-11-16 DIAGNOSIS — F330 Major depressive disorder, recurrent, mild: Secondary | ICD-10-CM

## 2023-11-16 DIAGNOSIS — E1142 Type 2 diabetes mellitus with diabetic polyneuropathy: Secondary | ICD-10-CM | POA: Diagnosis not present

## 2023-11-16 DIAGNOSIS — G4762 Sleep related leg cramps: Secondary | ICD-10-CM

## 2023-11-16 DIAGNOSIS — Z794 Long term (current) use of insulin: Secondary | ICD-10-CM

## 2023-11-16 DIAGNOSIS — H9313 Tinnitus, bilateral: Secondary | ICD-10-CM

## 2023-11-16 DIAGNOSIS — E782 Mixed hyperlipidemia: Secondary | ICD-10-CM | POA: Diagnosis not present

## 2023-11-16 DIAGNOSIS — K118 Other diseases of salivary glands: Secondary | ICD-10-CM | POA: Diagnosis not present

## 2023-11-17 ENCOUNTER — Other Ambulatory Visit: Payer: Self-pay | Admitting: Internal Medicine

## 2023-11-17 NOTE — Progress Notes (Signed)
 [facility]:  [facility] Service:  Clinic (12)  Provider:   Code Status:  Goals of Care:     05/18/2023    1:45 PM  Advanced Directives  Does Patient Have a Medical Advance Directive? No  Would patient like information on creating a medical advance directive? No - Patient declined     Chief Complaint  Patient presents with   Medical Management of Chronic Issues    3 month follow up with labs     HPI: Patient is a 83 y.o. male seen today for medical management of chronic diseases.    [place] in [place]  Patient has h/o Type 2 diabetes on insulin   S/p Left Shoulder Arthroplasty per Dr Melita Anxiety and Depression  BPPV Tinnitus with hearing loss also present Seen By ENT Think it is permanent and nothing more can be done H/o Lung Nodules  Mostly benign follows with Dr Shelah Repeat CT was negative   Discussed the use of AI scribe software for clinical note transcription with the patient, who gave verbal consent to proceed.  History of Present Illness   Stephen Burns is an 83 year old male with diabetes who presents for routine follow-up.  He has stable blood sugar levels with a recent A1c of 6.8% and is currently taking 30 units of insulin . He has increased his physical activity and reduced bread intake.  He experiences ongoing shoulder pain, especially with arm movements, with pain radiating down the arm. Previous shoulder surgeries contribute to discomfort. He occasionally uses 500 mg of Tylenol  for relief.  He experiences nocturnal leg cramps lasting about five minutes, managed by stretching and walking. Occasional numbness in his feet is noted.  He has a history of vertigo and tinnitus without significant change and continues Lexapro  for depression. Bowel movements are irregular, managed with Miralax  and dietary adjustments. He is up to date with his COVID vaccine and plans to receive the flu and shingles vaccines soon.      Past Medical  History:  Diagnosis Date   Arthritis    Complication of anesthesia    Difficult to wake up   Depression    Diabetes mellitus without complication (HCC)    Headache    History of kidney stones    Pulmonary embolism (HCC)    After shoulder surgery   Vertigo     Past Surgical History:  Procedure Laterality Date   ADENOIDECTOMY Bilateral    FINGER SURGERY     Left middle   FOOT SURGERY Left    laceration/ tendon repair   forehead surgery     L4-L5 fusion     LITHOTRIPSY     Prostrate     Frozen   REVERSE SHOULDER ARTHROPLASTY Left 06/17/2022   Procedure: REVERSE SHOULDER ARTHROPLASTY;  Surgeon: Melita Drivers, MD;  Location: [facility];  Service: Orthopedics;  Laterality: Left;    SHOULDER SURGERY     Complete replacement to right shoulder   TONSILLECTOMY     TRANSURETHRAL RESECTION OF PROSTATE N/A 09/02/2020   Procedure: TRANSURETHRAL RESECTION OF THE PROSTATE (TURP);  Surgeon: Watt Rush, MD;  Location: [facility];  Service: Urology;  Laterality: N/A;    Allergies  Allergen Reactions   Sulfa Antibiotics Swelling    Joint swelling    Outpatient Encounter Medications as of 11/16/2023  Medication Sig   Accu-Chek Softclix Lancets lancets Use to check blood glucose level twice a day E11.40   acetaminophen  (  TYLENOL ) 500 MG tablet Take 500 mg by mouth 2 (two) times daily.   amoxicillin  (AMOXIL ) 500 MG capsule Take 4 tabs 1 hour before Dental Cleaning   atorvastatin  (LIPITOR) 40 MG tablet Take 1 tablet (40 mg total) by mouth at bedtime. E78.2   B-D ULTRAFINE III SHORT PEN 31G X 8 MM MISC USE AS DIRECTED   carbidopa -levodopa  (SINEMET  CR) 50-200 MG tablet TAKE 1 TABLET BY MOUTH TWICE A DAY   Continuous Glucose Receiver (DEXCOM G7 RECEIVER) DEVI For CBG monitoring   escitalopram  (LEXAPRO ) 10 MG tablet Take 1 tablet (10 mg total) by mouth daily.   fluticasone  (FLONASE ) 50 MCG/ACT nasal spray Place 1 spray into both nostrils daily as needed for allergies or rhinitis.   gabapentin   (NEURONTIN ) 300 MG capsule TAKE TWO CAPSULES IN THE MORNING, TAKE ONE IN THE AFTERNOON AND TAKE TWO AT BEDTIME.   glucose blood (ACCU-CHEK GUIDE) test strip Use to test blood glucose level twice a day DX: E11.40   hydrocortisone cream 1 % Apply 1 Application topically 2 (two) times daily as needed for itching.   Insulin  Glargine (BASAGLAR  KWIKPEN) 100 UNIT/ML Inject 30 Units into the skin in the morning. E11.40   meclizine  (ANTIVERT ) 25 MG tablet Take 1 tablet (25 mg total) by mouth 3 (three) times daily as needed for dizziness.   MULTIPLE MINERALS PO Take 1 tablet by mouth daily.   polyethylene glycol (MIRALAX  / GLYCOLAX ) 17 g packet Take 17 g by mouth daily.   [DISCONTINUED] carbidopa -levodopa  (SINEMET  CR) 50-200 MG tablet TAKE 1 TABLET BY MOUTH TWICE A DAY   [DISCONTINUED] Continuous Glucose Sensor (DEXCOM G7 SENSOR) MISC Change Sensor every 10 days. Dx:E11.40   No facility-administered encounter medications on file as of 11/16/2023.    Review of Systems:  Review of Systems  Constitutional:  Negative for activity change, appetite change and unexpected weight change.  HENT: Negative.    Respiratory:  Negative for cough and shortness of breath.   Cardiovascular:  Negative for leg swelling.  Gastrointestinal:  Negative for constipation.  Genitourinary:  Negative for frequency.  Musculoskeletal:  Positive for arthralgias. Negative for gait problem and myalgias.  Skin: Negative.  Negative for rash.  Neurological:  Negative for dizziness and weakness.  Psychiatric/Behavioral:  Negative for confusion and sleep disturbance.   All other systems reviewed and are negative.   Health Maintenance  Topic Date Due   Zoster Vaccines- Shingrix (1 of 2) Never done   DTaP/Tdap/Td (4 - Tdap) 06/10/2011   Diabetic kidney evaluation - Urine ACR  05/29/2021   Medicare Annual Wellness (AWV)  01/05/2023   FOOT EXAM  06/02/2023   Influenza Vaccine  09/09/2023   COVID-19 Vaccine (9 - 2025-26 season)  04/17/2024   OPHTHALMOLOGY EXAM  04/26/2024   HEMOGLOBIN A1C  05/10/2024   Diabetic kidney evaluation - eGFR measurement  11/09/2024   Pneumococcal Vaccine: 50+ Years  Completed   Meningococcal B Vaccine  Aged Out    Physical Exam: Vitals:   11/16/23 1118  BP: 110/68  Pulse: 70  Resp: 18  Temp: (!) 97.2 F (36.2 C)  SpO2: 98%  Weight: 208 lb 1.6 oz (94.4 kg)  Height: 5' 11 (1.803 m)   Body mass index is 29.02 kg/m. Physical Exam Vitals reviewed.  Constitutional:      Appearance: Normal appearance.  HENT:     Head: Normocephalic.     Nose: Nose normal.     Mouth/Throat:     Mouth: Mucous membranes are moist.  Pharynx: Oropharynx is clear.  Eyes:     Pupils: Pupils are equal, round, and reactive to light.  Cardiovascular:     Rate and Rhythm: Normal rate and regular rhythm.     Pulses: Normal pulses.     Heart sounds: No murmur heard. Pulmonary:     Effort: Pulmonary effort is normal. No respiratory distress.     Breath sounds: Normal breath sounds. No rales.  Abdominal:     General: Abdomen is flat. Bowel sounds are normal.     Palpations: Abdomen is soft.  Musculoskeletal:        General: No swelling.     Cervical back: Neck supple.  Skin:    General: Skin is warm.  Neurological:     General: No focal deficit present.     Mental Status: He is alert and oriented to person, place, and time.  Psychiatric:        Mood and Affect: Mood normal.        Thought Content: Thought content normal.     Labs reviewed: Basic Metabolic Panel: Recent Labs    02/10/23 0818 05/10/23 0707 08/04/23 0725 11/10/23 0740  NA 137 141 140 139  K 4.6 4.1 4.2 4.3  CL 104 108 106 105  CO2 27 24 27 27   GLUCOSE 106* 131* 133* 128*  BUN 22 23 18 21   CREATININE 0.86 0.74 0.78 0.79  CALCIUM  9.4 9.0 9.2 9.4  TSH 1.57 1.56  --   --    Liver Function Tests: Recent Labs    05/10/23 0707 08/04/23 0725 11/10/23 0740  AST 20 21 18   ALT 17 12 9   BILITOT 0.7 0.9 0.8  PROT  6.4 6.7 6.8   No results for input(s): LIPASE, AMYLASE in the last 8760 hours. No results for input(s): AMMONIA in the last 8760 hours. CBC: Recent Labs    05/10/23 0707 08/04/23 0725 11/10/23 0740  WBC 4.8 4.5 4.5  NEUTROABS 3,163 2,768 2,723  HGB 14.6 15.1 15.3  HCT 43.1 46.1 44.8  MCV 92.9 95.1 92.8  PLT 229 230 239   Lipid Panel: Recent Labs    02/10/23 0818 05/10/23 0707  CHOL 127 121  HDL 42 40  LDLCALC 69 64  TRIG 81 86  CHOLHDL 3.0 3.0   Lab Results  Component Value Date   HGBA1C 6.8 (H) 11/10/2023    Procedures since last visit: No results found.  Assessment/Plan  Assessment and Plan    Type 2 diabetes mellitus with diabetic polyneuropathy Diabetes well-controlled with A1c of 6.8%. Discussed Ozempic for weight loss and diabetes management; he is hesitant due to lifelong use and side effects.  - Continue current diabetes management plan.  Bilateral shoulder pain after joint replacement Persistent pain likely due to arthritis, managed with Tylenol . - Continue as needed use of Tylenol  for pain management.  Vertigo and tinnitus Chronic symptoms stable. Seen ENT Major depressive disorder Continues Lexapro  for depression management. - Continue Lexapro  as prescribed.  Constipation Intermittent constipation managed with Miralax ; discussed dosage adjustment and dietary changes. - Adjust Miralax  to half a cup daily or alternate with one cup every other day. - Incorporate 2-3 prunes daily if tolerated.  Leg cramps Experiences nocturnal leg cramps; discussed gabapentin  and stretching exercises. - Consider lower dose gabapentin  for cramps. - Perform stretching exercises before sleep. He is also on Sinemet   Benign parotid nodule under surveillance Benign nodule previously evaluated; follow-up ultrasound recommended. - Order repeat ultrasound of parotid nodule for end of  January.      HLD statin Labs/tests ordered:  * No order type specified  * Next appt:  11/17/2023

## 2023-11-19 ENCOUNTER — Other Ambulatory Visit: Payer: Self-pay | Admitting: Internal Medicine

## 2023-11-19 DIAGNOSIS — E1149 Type 2 diabetes mellitus with other diabetic neurological complication: Secondary | ICD-10-CM

## 2023-11-19 DIAGNOSIS — Z981 Arthrodesis status: Secondary | ICD-10-CM

## 2023-11-20 ENCOUNTER — Other Ambulatory Visit: Payer: Self-pay | Admitting: Internal Medicine

## 2023-11-20 DIAGNOSIS — E1140 Type 2 diabetes mellitus with diabetic neuropathy, unspecified: Secondary | ICD-10-CM

## 2023-12-21 ENCOUNTER — Encounter: Payer: Self-pay | Admitting: Family

## 2023-12-21 ENCOUNTER — Ambulatory Visit: Admitting: Family

## 2023-12-21 VITALS — BP 112/68 | HR 75 | Temp 97.9°F | Resp 17 | Ht 71.0 in | Wt 210.6 lb

## 2023-12-21 DIAGNOSIS — R21 Rash and other nonspecific skin eruption: Secondary | ICD-10-CM | POA: Diagnosis not present

## 2023-12-21 MED ORDER — PREDNISONE 20 MG PO TABS: ORAL_TABLET | ORAL | 0 refills | Status: AC

## 2023-12-21 NOTE — Patient Instructions (Addendum)
-   Monitor blood sugar three times and at bedtime.Take extra 2 units of inulin if blood sugar are > 200 and notify provider  - Notify provider if symptoms worsen or fail to resolve

## 2023-12-28 ENCOUNTER — Encounter: Payer: Self-pay | Admitting: Family

## 2023-12-28 ENCOUNTER — Ambulatory Visit: Admitting: Family

## 2023-12-28 VITALS — BP 120/70 | HR 74 | Temp 98.5°F | Ht 71.0 in | Wt 209.0 lb

## 2023-12-28 DIAGNOSIS — R21 Rash and other nonspecific skin eruption: Secondary | ICD-10-CM

## 2023-12-28 MED ORDER — PREDNISONE 20 MG PO TABS: ORAL_TABLET | ORAL | 0 refills | Status: AC

## 2024-01-01 NOTE — Progress Notes (Signed)
 Provider: Calinda Stockinger FNP-C  [name], MD  Patient Care Team: [name], MD as PCP - General (Internal Medicine)  Extended Emergency Contact Information Primary Emergency Contact: Moure,[name] Mobile Phone: [phone] Relation: Son Secondary Emergency Contact: [facility] Address: [address]          [place], [place] [address] [place] Home Phone: [phone] Mobile Phone: [phone] Relation: Spouse Preferred language: English Interpreter needed? No  Code Status: Full code  Goals of care: Advanced Directive information    12/21/2023    1:48 PM  Advanced Directives  Does Patient Have a Medical Advance Directive? Yes  Type of Estate Agent of [place];Living will  Does patient want to make changes to medical advance directive? No - Patient declined  Copy of Healthcare Power of Attorney in Chart? No - copy requested     Chief Complaint  Patient presents with   Rash    HPI:  Pt is a 83 y.o. male seen today for an acute visit for evaluation of generalized rash for couple of days.Reports no pain or burning sensation.He was unable to sleep last night due to itching.Rash started on the arms,thighs then abdomen and back. He reports no changes in lotion, laundry detergent soap. No recent hiking or woods.No recent new medication.does not recall eating out. He has not been close bitten by any insects. He denies any fever, chills, cough,shortness of breath, nausea, vomiting or diarrhea  Past Medical History:  Diagnosis Date   Arthritis    Complication of anesthesia    Difficult to wake up   Depression    Diabetes mellitus without complication (HCC)    Headache    History of kidney stones    Pulmonary embolism (HCC)    After shoulder surgery   Vertigo    Past Surgical History:  Procedure Laterality Date   ADENOIDECTOMY Bilateral    FINGER SURGERY     Left middle   FOOT SURGERY Left    laceration/  tendon repair   forehead surgery     L4-L5 fusion     LITHOTRIPSY     Prostrate     Frozen   REVERSE SHOULDER ARTHROPLASTY Left 06/17/2022   Procedure: REVERSE SHOULDER ARTHROPLASTY;  Surgeon: Melita Drivers, MD;  Location: [facility];  Service: Orthopedics;  Laterality: Left;    SHOULDER SURGERY     Complete replacement to right shoulder   TONSILLECTOMY     TRANSURETHRAL RESECTION OF PROSTATE N/A 09/02/2020   Procedure: TRANSURETHRAL RESECTION OF THE PROSTATE (TURP);  Surgeon: Watt Rush, MD;  Location: [facility];  Service: Urology;  Laterality: N/A;    Allergies  Allergen Reactions   Sulfa Antibiotics Swelling    Joint swelling    Outpatient Encounter Medications as of 12/21/2023  Medication Sig   Accu-Chek Softclix Lancets lancets Use to check blood glucose level twice a day E11.40   acetaminophen  (TYLENOL ) 500 MG tablet Take 500 mg by mouth 2 (two) times daily.   amoxicillin  (AMOXIL ) 500 MG capsule Take 4 tabs 1 hour before Dental Cleaning   atorvastatin  (LIPITOR) 40 MG tablet Take 1 tablet (40 mg total) by mouth at bedtime. E78.2   carbidopa -levodopa  (SINEMET  CR) 50-200 MG tablet TAKE 1 TABLET BY MOUTH TWICE A DAY   Continuous Glucose Receiver (DEXCOM G7 RECEIVER) DEVI For CBG monitoring   Continuous Glucose Sensor (DEXCOM G7 SENSOR) MISC CHANGE SENSOR EVERY 10 DAYS. DX:E11.40   EMBECTA PEN NEEDLE ULTRAFINE 31G X 8 MM MISC  USE AS DIRECTED   escitalopram  (LEXAPRO ) 10 MG tablet Take 1 tablet (10 mg total) by mouth daily.   fluticasone  (FLONASE ) 50 MCG/ACT nasal spray Place 1 spray into both nostrils daily as needed for allergies or rhinitis.   gabapentin  (NEURONTIN ) 300 MG capsule TAKE TWO CAPSULES IN THE MORNING, TAKE ONE IN THE AFTERNOON AND TAKE TWO AT BEDTIME.   glucose blood (ACCU-CHEK GUIDE) test strip Use to test blood glucose level twice a day DX: E11.40   hydrocortisone cream 1 % Apply 1 Application topically 2 (two) times daily as needed for itching.   Insulin  Glargine  (BASAGLAR  KWIKPEN) 100 UNIT/ML Inject 30 Units into the skin in the morning. E11.40   meclizine  (ANTIVERT ) 25 MG tablet Take 1 tablet (25 mg total) by mouth 3 (three) times daily as needed for dizziness.   MULTIPLE MINERALS PO Take 1 tablet by mouth daily.   polyethylene glycol (MIRALAX  / GLYCOLAX ) 17 g packet Take 17 g by mouth daily.   [EXPIRED] predniSONE  (DELTASONE ) 20 MG tablet Take 2 tablets (40 mg total) by mouth daily with breakfast for 1 day, THEN 1.5 tablets (30 mg total) daily with breakfast for 1 day, THEN 1 tablet (20 mg total) daily with breakfast for 1 day, THEN 0.5 tablets (10 mg total) daily with breakfast for 1 day.   No facility-administered encounter medications on file as of 12/21/2023.    Review of Systems  Constitutional:  Negative for appetite change, chills, fatigue, fever and unexpected weight change.  HENT:  Negative for congestion, dental problem, ear discharge, ear pain, facial swelling, hearing loss, nosebleeds, postnasal drip, rhinorrhea, sinus pressure, sinus pain, sneezing, sore throat, tinnitus and trouble swallowing.   Eyes:  Negative for pain, discharge, redness, itching and visual disturbance.  Respiratory:  Negative for cough, chest tightness, shortness of breath and wheezing.   Cardiovascular:  Negative for chest pain, palpitations and leg swelling.  Gastrointestinal:  Negative for abdominal distention, abdominal pain, constipation, diarrhea, nausea and vomiting.  Genitourinary:  Negative for difficulty urinating, dysuria, flank pain, frequency and urgency.  Musculoskeletal:  Negative for arthralgias, back pain, gait problem, joint swelling, myalgias, neck pain and neck stiffness.  Skin:  Positive for rash. Negative for color change, pallor and wound.  Neurological:  Negative for dizziness, weakness, light-headedness, numbness and headaches.  Hematological:  Does not bruise/bleed easily.  Psychiatric/Behavioral:  Negative for agitation, behavioral problems,  confusion, hallucinations and sleep disturbance. The patient is not nervous/anxious.     Immunization History  Administered Date(s) Administered   Fluad Quad(high Dose 65+) 11/24/2020, 11/25/2021, 11/30/2022   INFLUENZA, HIGH DOSE SEASONAL PF 11/24/2020   Influenza-Unspecified 01/08/2002, 12/16/2003, 11/07/2007, 11/05/2023   Moderna Covid-19 Vaccine Bivalent Booster 66yrs & up 11/16/2022   Moderna Sars-Covid-2 Vaccination 12/15/2021   PFIZER Comirnaty(Gray Top)Covid-19 Tri-Sucrose Vaccine 03/16/2019, 04/06/2019, 11/05/2019   PNEUMOCOCCAL CONJUGATE-20 06/02/2021   Pfizer Covid-19 Vaccine Bivalent Booster 66yrs & up 06/27/2020, 10/28/2020   Pfizer(Comirnaty)Fall Seasonal Vaccine 12 years and older 10/19/2023   Td 06/09/2001   Td (Adult),5 Lf Tetanus Toxid, Preservative Free 06/09/2001   Td (Adult),unspecified 06/09/2001   Pertinent  Health Maintenance Due  Topic Date Due   FOOT EXAM  06/02/2023   OPHTHALMOLOGY EXAM  04/26/2024   HEMOGLOBIN A1C  05/10/2024   Influenza Vaccine  Completed      12/15/2022    8:35 AM 02/16/2023    1:47 PM 03/17/2023    2:43 PM 05/18/2023    1:44 PM 11/16/2023   11:12 AM  Fall  Risk  Falls in the past year? 0 0 0 0 0  Was there an injury with Fall? 0 0 0 0 0  Fall Risk Category Calculator 0 0 0 0 0  Patient at Risk for Falls Due to No Fall Risks No Fall Risks  No Fall Risks   Fall risk Follow up Falls evaluation completed Falls evaluation completed  Falls evaluation completed Falls evaluation completed   Functional Status Survey:    Vitals:   12/21/23 1352  BP: 112/68  Pulse: 75  Resp: 17  Temp: 97.9 F (36.6 C)  SpO2: 96%  Weight: 210 lb 9.6 oz (95.5 kg)  Height: 5' 11 (1.803 m)   Body mass index is 29.37 kg/m. Physical Exam Vitals reviewed.  Constitutional:      General: He is not in acute distress.    Appearance: Normal appearance. He is normal weight. He is not ill-appearing or diaphoretic.  HENT:     Head: Normocephalic.     Right  Ear: Tympanic membrane, ear canal and external ear normal. There is no impacted cerumen.     Left Ear: Tympanic membrane, ear canal and external ear normal. There is no impacted cerumen.     Nose: Nose normal. No congestion or rhinorrhea.     Mouth/Throat:     Mouth: Mucous membranes are moist.     Pharynx: Oropharynx is clear. No oropharyngeal exudate or posterior oropharyngeal erythema.  Eyes:     General: No scleral icterus.       Right eye: No discharge.        Left eye: No discharge.     Extraocular Movements: Extraocular movements intact.     Conjunctiva/sclera: Conjunctivae normal.     Pupils: Pupils are equal, round, and reactive to light.  Neck:     Vascular: No carotid bruit.  Cardiovascular:     Rate and Rhythm: Normal rate and regular rhythm.     Pulses: Normal pulses.     Heart sounds: Normal heart sounds. No murmur heard.    No friction rub. No gallop.  Pulmonary:     Effort: Pulmonary effort is normal. No respiratory distress.     Breath sounds: Normal breath sounds. No wheezing, rhonchi or rales.  Chest:     Chest wall: No tenderness.  Abdominal:     General: Bowel sounds are normal. There is no distension.     Palpations: Abdomen is soft. There is no mass.     Tenderness: There is no abdominal tenderness. There is no right CVA tenderness, left CVA tenderness, guarding or rebound.  Musculoskeletal:        General: No swelling or tenderness. Normal range of motion.     Cervical back: Normal range of motion. No rigidity or tenderness.     Right lower leg: No edema.     Left lower leg: No edema.  Lymphadenopathy:     Cervical: No cervical adenopathy.  Skin:    General: Skin is warm and dry.     Coloration: Skin is not pale.     Findings: Rash present. No bruising, erythema or lesion.     Comments: Multiple raised red rash on forearm,upper thigh areas and trunk without any drainage.   Neurological:     Mental Status: He is alert and oriented to person, place, and  time.     Cranial Nerves: No cranial nerve deficit.     Sensory: No sensory deficit.     Motor: No weakness.  Coordination: Coordination normal.     Gait: Gait normal.  Psychiatric:        Mood and Affect: Mood normal.        Speech: Speech normal.        Behavior: Behavior normal.        Thought Content: Thought content normal.        Judgment: Judgment normal.    Labs reviewed: Recent Labs    05/10/23 0707 08/04/23 0725 11/10/23 0740  NA 141 140 139  K 4.1 4.2 4.3  CL 108 106 105  CO2 24 27 27   GLUCOSE 131* 133* 128*  BUN 23 18 21   CREATININE 0.74 0.78 0.79  CALCIUM  9.0 9.2 9.4   Recent Labs    05/10/23 0707 08/04/23 0725 11/10/23 0740  AST 20 21 18   ALT 17 12 9   BILITOT 0.7 0.9 0.8  PROT 6.4 6.7 6.8   Recent Labs    05/10/23 0707 08/04/23 0725 11/10/23 0740  WBC 4.8 4.5 4.5  NEUTROABS 3,163 2,768 2,723  HGB 14.6 15.1 15.3  HCT 43.1 46.1 44.8  MCV 92.9 95.1 92.8  PLT 229 230 239   Lab Results  Component Value Date   TSH 1.56 05/10/2023   Lab Results  Component Value Date   HGBA1C 6.8 (H) 11/10/2023   Lab Results  Component Value Date   CHOL 121 05/10/2023   HDL 40 05/10/2023   LDLCALC 64 05/10/2023   TRIG 86 05/10/2023   CHOLHDL 3.0 05/10/2023    Significant Diagnostic Results in last 30 days:  No results found.  Assessment/Plan  Rash and nonspecific skin eruption (Primary) Afebrile  - unclear etiology  - Multiple raised red rash on forearm,upper thigh areas and trunk without any drainage.  - Start on Prednisone .Side effects discussed such high blood sugar levels.Made aware to increase Insulin  by two units if CBG > 150 and Notify provider if over 400  - predniSONE  (DELTASONE ) 20 MG tablet; Take 2 tablets (40 mg total) by mouth daily with breakfast for 1 day, THEN 1.5 tablets (30 mg total) daily with breakfast for 1 day, THEN 1 tablet (20 mg total) daily with breakfast for 1 day, THEN 0.5 tablets (10 mg total) daily with breakfast for 1  day.  Dispense: 5 tablet; Refill: 0  Family/ staff Communication: Reviewed plan of care with patient verbalized understanding   Labs/tests ordered: None   Next Appointment: Return in about 1 week (around 12/28/2023) for with DR.Charlanne .  Total time: 20 minutes. Greater than 50% of total time spent doing patient education regarding  Rash and health maintenance including symptom/medication management.   Roxan JAYSON Plough, NP

## 2024-01-03 NOTE — Progress Notes (Signed)
 Provider: Diontae Route FNP-C  [name], MD  Patient Care Team: [name], MD as PCP - General (Internal Medicine)  Extended Emergency Contact Information Primary Emergency Contact: Pileggi,[name] Mobile Phone: [phone] Relation: Son Secondary Emergency Contact: [facility] Address: [address]          [place], [place] [address] [place] Home Phone: [phone] Mobile Phone: [phone] Relation: Spouse Preferred language: English Interpreter needed? No  Code Status: Full Code  Goals of care: Advanced Directive information    12/21/2023    1:48 PM  Advanced Directives  Does Patient Have a Medical Advance Directive? Yes  Type of Estate Agent of [place];Living will  Does patient want to make changes to medical advance directive? No - Patient declined  Copy of Healthcare Power of Attorney in Chart? No - copy requested     Chief Complaint  Patient presents with   Follow-up    1 week follow up Patient has concerns about sharpe pain right on toe. Patient has concerns about rash all over body seems to be getting worse.     Discussed the use of AI scribe software for clinical note transcription with the patient, who gave verbal consent to proceed.  History of Present Illness   Stephen Burns is an 83 year old male with diabetes who presents with a persistent rash.  He has a persistent rash that initially improved with prednisone  but has since spread to his back, shoulders, armpits, and groin. The rash is described as 'brilliant' in color and is sometimes itchy, causing difficulty sleeping. He applies cortisone cream for relief. No fever or drainage from the rash is noted. He has not changed any soaps or lotions recently.  He completed a course of prednisone , which initially seemed to help, but the rash has since worsened in some areas. He has been using the same soaps and lotions for years.  He also  reports a separate issue with his left great toe, which has a history of fungal infection. The toenail has not fallen off, and the infection has been present for six to seven months. He experiences sharp pain in the toe, which becomes red when he walks.  His diabetes management is complicated by insurance issues regarding his insulin  coverage. He recently switched to [employer], which has questioned his need for continuous glucose monitoring and insulin . His blood sugar levels were elevated while on prednisone , but he managed them by administering additional insulin  shots at bedtime.  Past Medical History:  Diagnosis Date   Arthritis    Complication of anesthesia    Difficult to wake up   Depression    Diabetes mellitus without complication (HCC)    Headache    History of kidney stones    Pulmonary embolism (HCC)    After shoulder surgery   Vertigo    Past Surgical History:  Procedure Laterality Date   ADENOIDECTOMY Bilateral    FINGER SURGERY     Left middle   FOOT SURGERY Left    laceration/ tendon repair   forehead surgery     L4-L5 fusion     LITHOTRIPSY     Prostrate     Frozen   REVERSE SHOULDER ARTHROPLASTY Left 06/17/2022   Procedure: REVERSE SHOULDER ARTHROPLASTY;  Surgeon: Melita Drivers, MD;  Location: [facility];  Service: Orthopedics;  Laterality: Left;    SHOULDER SURGERY     Complete replacement to right shoulder   TONSILLECTOMY  TRANSURETHRAL RESECTION OF PROSTATE N/A 09/02/2020   Procedure: TRANSURETHRAL RESECTION OF THE PROSTATE (TURP);  Surgeon: Watt Rush, MD;  Location: [facility];  Service: Urology;  Laterality: N/A;    Allergies  Allergen Reactions   Sulfa Antibiotics Swelling    Joint swelling    Outpatient Encounter Medications as of 12/28/2023  Medication Sig   Accu-Chek Softclix Lancets lancets Use to check blood glucose level twice a day E11.40   acetaminophen  (TYLENOL ) 500 MG tablet Take 500 mg by mouth 2 (two) times daily.    amoxicillin  (AMOXIL ) 500 MG capsule Take 4 tabs 1 hour before Dental Cleaning   atorvastatin  (LIPITOR) 40 MG tablet Take 1 tablet (40 mg total) by mouth at bedtime. E78.2   carbidopa -levodopa  (SINEMET  CR) 50-200 MG tablet TAKE 1 TABLET BY MOUTH TWICE A DAY   Continuous Glucose Receiver (DEXCOM G7 RECEIVER) DEVI For CBG monitoring   Continuous Glucose Sensor (DEXCOM G7 SENSOR) MISC CHANGE SENSOR EVERY 10 DAYS. DX:E11.40   EMBECTA PEN NEEDLE ULTRAFINE 31G X 8 MM MISC USE AS DIRECTED   escitalopram  (LEXAPRO ) 10 MG tablet Take 1 tablet (10 mg total) by mouth daily.   fluticasone  (FLONASE ) 50 MCG/ACT nasal spray Place 1 spray into both nostrils daily as needed for allergies or rhinitis.   gabapentin  (NEURONTIN ) 300 MG capsule TAKE TWO CAPSULES IN THE MORNING, TAKE ONE IN THE AFTERNOON AND TAKE TWO AT BEDTIME.   glucose blood (ACCU-CHEK GUIDE) test strip Use to test blood glucose level twice a day DX: E11.40   hydrocortisone cream 1 % Apply 1 Application topically 2 (two) times daily as needed for itching.   Insulin  Glargine (BASAGLAR  KWIKPEN) 100 UNIT/ML Inject 30 Units into the skin in the morning. E11.40   meclizine  (ANTIVERT ) 25 MG tablet Take 1 tablet (25 mg total) by mouth 3 (three) times daily as needed for dizziness.   MULTIPLE MINERALS PO Take 1 tablet by mouth daily.   polyethylene glycol (MIRALAX  / GLYCOLAX ) 17 g packet Take 17 g by mouth daily.   [EXPIRED] predniSONE  (DELTASONE ) 20 MG tablet Take 2 tablets (40 mg total) by mouth daily with breakfast for 1 day, THEN 1.5 tablets (30 mg total) daily with breakfast for 1 day, THEN 1 tablet (20 mg total) daily with breakfast for 1 day, THEN 0.5 tablets (10 mg total) daily with breakfast for 1 day.   No facility-administered encounter medications on file as of 12/28/2023.    Review of Systems  Constitutional:  Negative for appetite change, chills, fatigue, fever and unexpected weight change.  HENT:  Negative for congestion, dental problem, ear  discharge, ear pain, facial swelling, hearing loss, nosebleeds, postnasal drip, rhinorrhea, sinus pressure, sinus pain, sneezing, sore throat, tinnitus and trouble swallowing.   Eyes:  Negative for pain, discharge, redness, itching and visual disturbance.  Respiratory:  Negative for cough, chest tightness, shortness of breath and wheezing.   Cardiovascular:  Negative for chest pain, palpitations and leg swelling.  Gastrointestinal:  Negative for abdominal distention, abdominal pain, blood in stool, constipation, diarrhea, nausea and vomiting.  Endocrine: Negative for cold intolerance, heat intolerance, polydipsia, polyphagia and polyuria.  Genitourinary:  Negative for difficulty urinating, dysuria, flank pain, frequency and urgency.  Musculoskeletal:  Negative for arthralgias, back pain, gait problem, joint swelling, myalgias, neck pain and neck stiffness.  Skin:  Positive for rash. Negative for color change, pallor and wound.  Neurological:  Negative for dizziness, syncope, speech difficulty, weakness, light-headedness, numbness and headaches.  Hematological:  Does not bruise/bleed easily.  Psychiatric/Behavioral:  Negative for agitation, behavioral problems, confusion, hallucinations, self-injury, sleep disturbance and suicidal ideas. The patient is not nervous/anxious.     Immunization History  Administered Date(s) Administered   Fluad Quad(high Dose 65+) 11/24/2020, 11/25/2021, 11/30/2022   INFLUENZA, HIGH DOSE SEASONAL PF 11/24/2020   Influenza-Unspecified 01/08/2002, 12/16/2003, 11/07/2007, 11/05/2023   Moderna Covid-19 Vaccine Bivalent Booster 7yrs & up 11/16/2022   Moderna Sars-Covid-2 Vaccination 12/15/2021   PFIZER Comirnaty(Gray Top)Covid-19 Tri-Sucrose Vaccine 03/16/2019, 04/06/2019, 11/05/2019   PNEUMOCOCCAL CONJUGATE-20 06/02/2021   Pfizer Covid-19 Vaccine Bivalent Booster 69yrs & up 06/27/2020, 10/28/2020   Pfizer(Comirnaty)Fall Seasonal Vaccine 12 years and older 10/19/2023    Td 06/09/2001   Td (Adult),5 Lf Tetanus Toxid, Preservative Free 06/09/2001   Td (Adult),unspecified 06/09/2001   Pertinent  Health Maintenance Due  Topic Date Due   FOOT EXAM  06/02/2023   OPHTHALMOLOGY EXAM  04/26/2024   HEMOGLOBIN A1C  05/10/2024   Influenza Vaccine  Completed      12/15/2022    8:35 AM 02/16/2023    1:47 PM 03/17/2023    2:43 PM 05/18/2023    1:44 PM 11/16/2023   11:12 AM  Fall Risk  Falls in the past year? 0 0 0 0 0  Was there an injury with Fall? 0 0 0 0 0  Fall Risk Category Calculator 0 0 0 0 0  Patient at Risk for Falls Due to No Fall Risks No Fall Risks  No Fall Risks   Fall risk Follow up Falls evaluation completed Falls evaluation completed  Falls evaluation completed Falls evaluation completed   Functional Status Survey:    Vitals:   12/28/23 1504  BP: 120/70  Pulse: 74  Temp: 98.5 F (36.9 C)  SpO2: 93%  Weight: 209 lb (94.8 kg)  Height: 5' 11 (1.803 m)   Body mass index is 29.15 kg/m. Physical Exam  VITALS: T- 98.5, P- 74, BP- 120/70, SaO2- 93% GENERAL: Alert, cooperative, well developed, no acute distress. HEENT: Normocephalic, normal oropharynx, moist mucous membranes. CHEST: Clear to auscultation bilaterally. No wheezes, rhonchi, or crackles. CARDIOVASCULAR: Normal heart rate and rhythm, S1 and S2 normal without murmurs. ABDOMEN: Soft, non-tender, non-distended, without organomegaly. Normal bowel sounds. EXTREMITIES: No cyanosis or edema. NEUROLOGICAL: Cranial nerves grossly intact. Moves all extremities without gross motor or sensory deficit. SKIN: Erythematous rash present, persistent in some areas, including the lower back. Rash on arms improving.      Labs reviewed: Recent Labs    05/10/23 0707 08/04/23 0725 11/10/23 0740  NA 141 140 139  K 4.1 4.2 4.3  CL 108 106 105  CO2 24 27 27   GLUCOSE 131* 133* 128*  BUN 23 18 21   CREATININE 0.74 0.78 0.79  CALCIUM  9.0 9.2 9.4   Recent Labs    05/10/23 0707 08/04/23 0725  11/10/23 0740  AST 20 21 18   ALT 17 12 9   BILITOT 0.7 0.9 0.8  PROT 6.4 6.7 6.8   Recent Labs    05/10/23 0707 08/04/23 0725 11/10/23 0740  WBC 4.8 4.5 4.5  NEUTROABS 3,163 2,768 2,723  HGB 14.6 15.1 15.3  HCT 43.1 46.1 44.8  MCV 92.9 95.1 92.8  PLT 229 230 239   Lab Results  Component Value Date   TSH 1.56 05/10/2023   Lab Results  Component Value Date   HGBA1C 6.8 (H) 11/10/2023   Lab Results  Component Value Date   CHOL 121 05/10/2023   HDL 40 05/10/2023   LDLCALC 64 05/10/2023   TRIG 86  05/10/2023   CHOLHDL 3.0 05/10/2023    Significant Diagnostic Results in last 30 days:  No results found.  Assessment/Plan  Rash and other nonspecific skin eruption Persistent rash on the back, thighs, and lower back, with improvement on the arms. No fever or drainage. Differential includes viral reaction or other dermatological condition. Previous prednisone  treatment showed partial improvement. 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 No signs of acute infection. Previous toenail removal did not resolve the issue. - Continue follow-up with podiatrist for potential debridement and management of fungal infection  Type 2 diabetes mellitus Recent A1c of 6.8%. Blood sugar levels elevated during prednisone  treatment, managed with additional insulin  doses. Insurance issues with current insulin  coverage. - Scheduled appointment with endocrinologist to address insurance issues and insulin  coverage - Advised to bring letter from insurance company to endocrinologist appointment - Continue monitoring blood sugar levels   Family/ staff Communication: Reviewed plan of care with patient  Labs/tests ordered: None    Next Appointment: Return in about 1 week (around 01/04/2024) for Rash and Type 2 Diabetes with Dr.Gupta .   Total time: 20 minutes. Greater than 50% of total time spent doing patient education regarding Rash and other nonspecific skin eruption,Type 2 diabetes mellitus,Fungal infection of left great toenail,health maintenance including symptom/medication management.   Roxan JAYSON Plough, NP

## 2024-01-11 ENCOUNTER — Non-Acute Institutional Stay: Admitting: Internal Medicine

## 2024-01-11 ENCOUNTER — Encounter: Payer: Self-pay | Admitting: Internal Medicine

## 2024-01-11 VITALS — BP 128/67 | HR 88 | Temp 97.1°F | Resp 18 | Ht 71.0 in | Wt 211.2 lb

## 2024-01-11 DIAGNOSIS — R21 Rash and other nonspecific skin eruption: Secondary | ICD-10-CM

## 2024-01-11 DIAGNOSIS — Z794 Long term (current) use of insulin: Secondary | ICD-10-CM

## 2024-01-11 DIAGNOSIS — K118 Other diseases of salivary glands: Secondary | ICD-10-CM | POA: Diagnosis not present

## 2024-01-11 DIAGNOSIS — E1140 Type 2 diabetes mellitus with diabetic neuropathy, unspecified: Secondary | ICD-10-CM | POA: Diagnosis not present

## 2024-01-11 MED ORDER — TRIAMCINOLONE ACETONIDE 0.1 % EX CREA: 1.0000 | TOPICAL_CREAM | Freq: Two times a day (BID) | CUTANEOUS | 0 refills | Status: AC | PRN

## 2024-01-11 NOTE — Patient Instructions (Signed)
 Ceravue Itching Moisturizer

## 2024-01-13 NOTE — Progress Notes (Signed)
 Location: [facility]:  Clinic (12)  Provider:   Code Status:  Goals of Care:     12/21/2023    1:48 PM  Advanced Directives  Does Patient Have a Medical Advance Directive? Yes  Type of Estate Agent of [place];Living will  Does patient want to make changes to medical advance directive? No - Patient declined  Copy of Healthcare Power of Attorney in Chart? No - copy requested     Chief Complaint  Patient presents with   Medical Management of Chronic Issues    1 week for Rash and Type 2 Diabetes, needs to scheduled medicare annual wellness visit, diabetic kidney evaluation-urine ACR, tetanus and shingles vaccine.     HPI: Patient is a 83 y.o. male seen today for an acute visit for Rash and Insulin  Question  Discussed the use of AI scribe software for clinical note transcription with the patient, who gave verbal consent to proceed.  History of Present Illness   Stephen Burns is an 83 year old male who presents with a recurrent rash.  He describes a diffuse intensely pruritic rash that began on his arms, sides, and back, making his skin look like a "strawberry." A 4-day course of oral steroids reduced symptoms but the rash recurred. It now appears intermittently in strips on his sides and he uses a topical cream for temporary relief of itching.  He denies new medications, lotions, or detergent changes and has never had a similar rash. He has not seen dermatology and has had no further treatment beyond the initial prednisone .  He is now just using hydrocortisone over-the-counter  He also got a letter from [employer] saying that his insulin  needs to be change from basal to Lantus.  Patient states that he does not want this to happen and he is happy with his present insulin  type      Past Medical History:  Diagnosis Date   Arthritis    Complication of anesthesia    Difficult to wake up   Depression    Diabetes mellitus without  complication (HCC)    Headache    History of kidney stones    Pulmonary embolism (HCC)    After shoulder surgery   Vertigo     Past Surgical History:  Procedure Laterality Date   ADENOIDECTOMY Bilateral    FINGER SURGERY     Left middle   FOOT SURGERY Left    laceration/ tendon repair   forehead surgery     L4-L5 fusion     LITHOTRIPSY     Prostrate     Frozen   REVERSE SHOULDER ARTHROPLASTY Left 06/17/2022   Procedure: REVERSE SHOULDER ARTHROPLASTY;  Surgeon: Melita Drivers, MD;  Location: [facility];  Service: Orthopedics;  Laterality: Left;    SHOULDER SURGERY     Complete replacement to right shoulder   TONSILLECTOMY     TRANSURETHRAL RESECTION OF PROSTATE N/A 09/02/2020   Procedure: TRANSURETHRAL RESECTION OF THE PROSTATE (TURP);  Surgeon: Watt Rush, MD;  Location: [facility];  Service: Urology;  Laterality: N/A;    Allergies  Allergen Reactions   Sulfa Antibiotics Swelling    Joint swelling    Outpatient Encounter Medications as of 01/11/2024  Medication Sig   Accu-Chek Softclix Lancets lancets Use to check blood glucose level twice a day E11.40   acetaminophen  (TYLENOL ) 500 MG tablet Take 500 mg by mouth 2 (two) times daily.   amoxicillin  (AMOXIL ) 500 MG capsule  Take 4 tabs 1 hour before Dental Cleaning   atorvastatin  (LIPITOR) 40 MG tablet Take 1 tablet (40 mg total) by mouth at bedtime. E78.2   carbidopa -levodopa  (SINEMET  CR) 50-200 MG tablet TAKE 1 TABLET BY MOUTH TWICE A DAY   Continuous Glucose Receiver (DEXCOM G7 RECEIVER) DEVI For CBG monitoring   Continuous Glucose Sensor (DEXCOM G7 SENSOR) MISC CHANGE SENSOR EVERY 10 DAYS. DX:E11.40   EMBECTA PEN NEEDLE ULTRAFINE 31G X 8 MM MISC USE AS DIRECTED   escitalopram  (LEXAPRO ) 10 MG tablet Take 1 tablet (10 mg total) by mouth daily.   fluticasone  (FLONASE ) 50 MCG/ACT nasal spray Place 1 spray into both nostrils daily as needed for allergies or rhinitis.   gabapentin  (NEURONTIN ) 300 MG capsule TAKE TWO CAPSULES IN  THE MORNING, TAKE ONE IN THE AFTERNOON AND TAKE TWO AT BEDTIME.   glucose blood (ACCU-CHEK GUIDE) test strip Use to test blood glucose level twice a day DX: E11.40   hydrocortisone cream 1 % Apply 1 Application topically 2 (two) times daily as needed for itching.   Insulin  Glargine (BASAGLAR  KWIKPEN) 100 UNIT/ML Inject 30 Units into the skin in the morning. E11.40   meclizine  (ANTIVERT ) 25 MG tablet Take 1 tablet (25 mg total) by mouth 3 (three) times daily as needed for dizziness.   MULTIPLE MINERALS PO Take 1 tablet by mouth daily.   polyethylene glycol (MIRALAX  / GLYCOLAX ) 17 g packet Take 17 g by mouth daily.   triamcinolone  cream (KENALOG ) 0.1 % Apply 1 Application topically 2 (two) times daily as needed.   No facility-administered encounter medications on file as of 01/11/2024.    Review of Systems:  Review of Systems  Constitutional:  Negative for activity change, appetite change and unexpected weight change.  HENT: Negative.    Respiratory:  Negative for cough and shortness of breath.   Cardiovascular:  Negative for leg swelling.  Gastrointestinal:  Negative for constipation.  Genitourinary:  Negative for frequency.  Musculoskeletal:  Positive for arthralgias and gait problem. Negative for myalgias.  Skin:  Positive for rash.  Neurological:  Negative for dizziness and weakness.  Psychiatric/Behavioral:  Negative for confusion and sleep disturbance.   All other systems reviewed and are negative.   Health Maintenance  Topic Date Due   Zoster Vaccines- Shingrix (1 of 2) Never done   DTaP/Tdap/Td (4 - Tdap) 06/10/2011   Diabetic kidney evaluation - Urine ACR  05/29/2021   Medicare Annual Wellness (AWV)  01/05/2023   FOOT EXAM  06/02/2023   COVID-19 Vaccine (9 - 2025-26 season) 04/17/2024   OPHTHALMOLOGY EXAM  04/26/2024   HEMOGLOBIN A1C  05/10/2024   Diabetic kidney evaluation - eGFR measurement  11/09/2024   Pneumococcal Vaccine: 50+ Years  Completed   Influenza Vaccine   Completed   Meningococcal B Vaccine  Aged Out    Physical Exam: Vitals:   01/11/24 1052  BP: 128/67  Pulse: 88  Resp: 18  Temp: (!) 97.1 F (36.2 C)  SpO2: 97%  Weight: 211 lb 3.2 oz (95.8 kg)  Height: 5' 11 (1.803 m)   Body mass index is 29.46 kg/m. Physical Exam Vitals reviewed.  Constitutional:      Appearance: Normal appearance.  HENT:     Head: Normocephalic.     Nose: Nose normal.     Mouth/Throat:     Mouth: Mucous membranes are moist.  Eyes:     Pupils: Pupils are equal, round, and reactive to light.  Musculoskeletal:     Cervical back: Neck  supple.  Skin:    Comments: Rash almost resolved  Neurological:     Mental Status: He is alert.     Labs reviewed: Basic Metabolic Panel: Recent Labs    02/10/23 0818 05/10/23 0707 08/04/23 0725 11/10/23 0740  NA 137 141 140 139  K 4.6 4.1 4.2 4.3  CL 104 108 106 105  CO2 27 24 27 27   GLUCOSE 106* 131* 133* 128*  BUN 22 23 18 21   CREATININE 0.86 0.74 0.78 0.79  CALCIUM  9.4 9.0 9.2 9.4  TSH 1.57 1.56  --   --    Liver Function Tests: Recent Labs    05/10/23 0707 08/04/23 0725 11/10/23 0740  AST 20 21 18   ALT 17 12 9   BILITOT 0.7 0.9 0.8  PROT 6.4 6.7 6.8   No results for input(s): LIPASE, AMYLASE in the last 8760 hours. No results for input(s): AMMONIA in the last 8760 hours. CBC: Recent Labs    05/10/23 0707 08/04/23 0725 11/10/23 0740  WBC 4.8 4.5 4.5  NEUTROABS 3,163 2,768 2,723  HGB 14.6 15.1 15.3  HCT 43.1 46.1 44.8  MCV 92.9 95.1 92.8  PLT 229 230 239   Lipid Panel: Recent Labs    02/10/23 0818 05/10/23 0707  CHOL 127 121  HDL 42 40  LDLCALC 69 64  TRIG 81 86  CHOLHDL 3.0 3.0   Lab Results  Component Value Date   HGBA1C 6.8 (H) 11/10/2023    Procedures since last visit: No results found.  Assessment/Plan 1. Rash and nonspecific skin eruption (Primary) Rash resolved Triamcinolone  called for any future outbreak Still not sure the cause of the rash  2. Type  2 diabetes mellitus with diabetic neuropathy, without long-term current use of insulin  (HCC) Insulin  A1C is 6.8  3. Mass of right parotid gland Follow up US  pending    Labs/tests ordered:  * No order type specified * Next appt:  03/14/2024

## 2024-01-16 ENCOUNTER — Telehealth: Payer: Self-pay

## 2024-01-16 NOTE — Telephone Encounter (Signed)
 Stephen Burns was seen in office last Wednesday and I spoke with Stephen Burns and she did let me know the form for Stephen Burns the insurance will change the medication will not cover because they have sent a temporary supply which if Stephen Fredia CROME, Stephen Burns  want to the continue with the medication then the next step we have do a prior authorization.   Message sent to Stephen Fredia CROME, Stephen Burns

## 2024-01-17 ENCOUNTER — Ambulatory Visit: Admitting: Podiatry

## 2024-01-17 ENCOUNTER — Encounter: Payer: Self-pay | Admitting: Podiatry

## 2024-01-17 DIAGNOSIS — M79676 Pain in unspecified toe(s): Secondary | ICD-10-CM | POA: Diagnosis not present

## 2024-01-17 DIAGNOSIS — B351 Tinea unguium: Secondary | ICD-10-CM | POA: Diagnosis not present

## 2024-01-17 DIAGNOSIS — E1142 Type 2 diabetes mellitus with diabetic polyneuropathy: Secondary | ICD-10-CM | POA: Diagnosis not present

## 2024-01-17 NOTE — Progress Notes (Signed)
 He presents with his wife today chief complaint of a thick hallux nail with discolored around the base he is diabetic so he is concerned that there may be a problem with it such as infection.  Objective: Vitals are stable alert oriented x 3 pulses are palpable.  He has a thick hallux nail with the remainder of the nail plates are normal.  He has some redness along the proximal border he states that it really hurt when he pulls his socks tight against the end of his toe.  [place]: Nail dystrophy hallux right.  Plan: Debrided the hallux nail today in thickness and in length hopefully this will help alleviate his symptoms I did explain to him that the next option would be nail avulsion he understands this is amenable to it we will follow-up with me if this fails to render him asymptomatic.

## 2024-01-27 ENCOUNTER — Ambulatory Visit (HOSPITAL_COMMUNITY)
Admission: RE | Admit: 2024-01-27 | Discharge: 2024-01-27 | Disposition: A | Discharge status: Home / Self Care | Source: Ambulatory Visit | Attending: Internal Medicine | Admitting: Internal Medicine

## 2024-01-27 DIAGNOSIS — K118 Other diseases of salivary glands: Secondary | ICD-10-CM | POA: Insufficient documentation

## 2024-01-30 ENCOUNTER — Ambulatory Visit: Admitting: Podiatry

## 2024-01-30 ENCOUNTER — Encounter: Payer: Self-pay | Admitting: Podiatry

## 2024-01-30 DIAGNOSIS — E1142 Type 2 diabetes mellitus with diabetic polyneuropathy: Secondary | ICD-10-CM | POA: Diagnosis not present

## 2024-01-30 DIAGNOSIS — B351 Tinea unguium: Secondary | ICD-10-CM | POA: Diagnosis not present

## 2024-01-30 DIAGNOSIS — M79676 Pain in unspecified toe(s): Secondary | ICD-10-CM

## 2024-01-30 NOTE — Progress Notes (Signed)
 This patient returns to my office for at risk foot care.  This patient requires this care by a professional since this patient will be at risk due to having type 2 diabetes.  This patient is unable to cut nails himself since the patient cannot reach his nails.These nails are painful walking and wearing shoes.  He presents to the office with his wife. This patient presents for at risk foot care today.  General Appearance  Alert, conversant and in no acute stress.  Vascular  Dorsalis pedis and posterior tibial  pulses are palpable  bilaterally.  Capillary return is within normal limits  bilaterally. Temperature is within normal limits  bilaterally.  Neurologic  Senn-Weinstein monofilament wire test within normal limits  bilaterally. Muscle power within normal limits bilaterally.  Nails Thick disfigured discolored nails with subungual debris  from hallux to fifth toes bilaterally. No evidence of bacterial infection or drainage bilaterally.  Orthopedic  No limitations of motion  feet .  No crepitus or effusions noted.  No bony pathology or digital deformities noted.  Skin  normotropic skin with no porokeratosis noted bilaterally.  No signs of infections or ulcers noted.     Onychomycosis  Pain in right toes  Pain in left toes  Consent was obtained for treatment procedures.   Mechanical debridement of nails 1-5  bilaterally performed with a nail nipper.  Filed with dremel without incident.    Return office visit     3 months                 Told patient to return for periodic foot care and evaluation due to potential at risk complications.   Ruffin Cotton DPM

## 2024-02-06 ENCOUNTER — Other Ambulatory Visit: Payer: Self-pay

## 2024-02-06 DIAGNOSIS — E1140 Type 2 diabetes mellitus with diabetic neuropathy, unspecified: Secondary | ICD-10-CM

## 2024-02-06 MED ORDER — BASAGLAR KWIKPEN 100 UNIT/ML ~~LOC~~ SOPN: 30.0000 [IU] | PEN_INJECTOR | Freq: Every morning | SUBCUTANEOUS | 1 refills | Status: DC

## 2024-02-15 ENCOUNTER — Ambulatory Visit: Payer: Self-pay | Admitting: Internal Medicine

## 2024-02-21 ENCOUNTER — Ambulatory Visit (INDEPENDENT_AMBULATORY_CARE_PROVIDER_SITE_OTHER): Admitting: Otolaryngology

## 2024-02-21 ENCOUNTER — Encounter (INDEPENDENT_AMBULATORY_CARE_PROVIDER_SITE_OTHER): Payer: Self-pay | Admitting: Otolaryngology

## 2024-02-21 VITALS — BP 116/72 | HR 79 | Ht 71.0 in

## 2024-02-21 DIAGNOSIS — H8110 Benign paroxysmal vertigo, unspecified ear: Secondary | ICD-10-CM | POA: Diagnosis not present

## 2024-02-21 DIAGNOSIS — K118 Other diseases of salivary glands: Secondary | ICD-10-CM | POA: Diagnosis not present

## 2024-02-21 DIAGNOSIS — H903 Sensorineural hearing loss, bilateral: Secondary | ICD-10-CM

## 2024-02-21 DIAGNOSIS — H6121 Impacted cerumen, right ear: Secondary | ICD-10-CM | POA: Diagnosis not present

## 2024-02-21 NOTE — Progress Notes (Signed)
 Dear Dr. Charlanne, Here is my assessment for our mutual patient, Stephen Burns. Thank you for allowing me the opportunity to care for your patient. Please do not hesitate to contact me should you have any other questions. Sincerely, Dr. Eldora Blanch  Otolaryngology Clinic Note Referring provider: Dr. Charlanne HPI:  Stephen Burns is a 84 y.o. male kindly referred by Dr. Charlanne for evaluation of right parotid nodule  Initial visit (08/2023): Incidentally noted right parotid nodule on MRI in June 2025. He has not noted any masses on his face. No skin cancers facial or scalp. No facial pain or numbness. No history of smoking. Patient otherwise denies: - dysphagia, odynophagia, unintentional weight loss - ear pain, neck masses  No prior Bx --------------------------------------------------------- 02/21/2024 Seen in follow up. He did do a repeat US . No skin cancers or pain or numbness. No h/o smoking. He does have some symptoms related to his cerumen with recurrent impactions. No ear infections or issues otherwise. Ears about the same  ENT Surgery: Tonsillectomy Personal or FHx of bleeding dz or anesthesia difficulty: no  Tobacco: never  PMHx: DM, Unstable Gait, Depression, Headaches  Independent Review of Additional Tests or Records:  MRI Brain 08/03/2023 independently interpreted: noted right T2 hyperintense lesion right parotid gland. Unable to visualize the neck for any nodes [facility] 12/13/2021 independently interpreted with respect to parotids: noted 8mm right parotid nodule (most likely). Unable to visualize the neck for any lymphadenopathy  Dr. Charlanne referral notes reviewed  US  Neck 01/27/2024 on independent interpreted: noted 10x4x6 cm right parotid lesion; stable without significant size change  PMH/Meds/All/SocHx/FamHx/ROS:   Past Medical History:  Diagnosis Date   Arthritis    Complication of anesthesia    Difficult to wake up   Depression    Diabetes mellitus without complication  (HCC)    Headache    History of kidney stones    Pulmonary embolism (HCC)    After shoulder surgery   Vertigo      Past Surgical History:  Procedure Laterality Date   ADENOIDECTOMY Bilateral    FINGER SURGERY     Left middle   FOOT SURGERY Left    laceration/ tendon repair   forehead surgery     L4-L5 fusion     LITHOTRIPSY     Prostrate     Frozen   REVERSE SHOULDER ARTHROPLASTY Left 06/17/2022   Procedure: REVERSE SHOULDER ARTHROPLASTY;  Surgeon: Melita Drivers, MD;  Location: [facility];  Service: Orthopedics;  Laterality: Left;    SHOULDER SURGERY     Complete replacement to right shoulder   TONSILLECTOMY     TRANSURETHRAL RESECTION OF PROSTATE N/A 09/02/2020   Procedure: TRANSURETHRAL RESECTION OF THE PROSTATE (TURP);  Surgeon: Watt Rush, MD;  Location: [facility];  Service: Urology;  Laterality: N/A;    History reviewed. No pertinent family history.   Social Connections: Moderately Integrated (01/04/2022)   Social Connection and Isolation Panel    Frequency of Communication with Friends and Family: More than three times a week    Frequency of Social Gatherings with Friends and Family: More than three times a week    Attends Religious Services: More than 4 times per year    Active Member of [name] or Organizations: No    Attends Banker Meetings: Never    Marital Status: Married      Current Outpatient Medications:    Accu-Chek Softclix Lancets lancets, Use to check blood glucose level twice a day E11.40, Disp: 100 each, Rfl:  12   acetaminophen  (TYLENOL ) 500 MG tablet, Take 500 mg by mouth 2 (two) times daily., Disp: , Rfl:    amoxicillin  (AMOXIL ) 500 MG capsule, Take 4 tabs 1 hour before Dental Cleaning, Disp: 4 capsule, Rfl: 0   atorvastatin  (LIPITOR) 40 MG tablet, Take 1 tablet (40 mg total) by mouth at bedtime. E78.2, Disp: 90 tablet, Rfl: 3   carbidopa -levodopa  (SINEMET  CR) 50-200 MG tablet, TAKE 1 TABLET BY MOUTH TWICE A DAY, Disp: 180 tablet, Rfl:  1   Continuous Glucose Receiver (DEXCOM G7 RECEIVER) DEVI, For CBG monitoring, Disp: 1 each, Rfl: 0   Continuous Glucose Sensor (DEXCOM G7 SENSOR) MISC, CHANGE SENSOR EVERY 10 DAYS. DX:E11.40, Disp: 9 each, Rfl: 1   EMBECTA PEN NEEDLE ULTRAFINE 31G X 8 MM MISC, USE AS DIRECTED, Disp: 100 each, Rfl: 3   escitalopram  (LEXAPRO ) 10 MG tablet, Take 1 tablet (10 mg total) by mouth daily., Disp: 90 tablet, Rfl: 2   fluticasone  (FLONASE ) 50 MCG/ACT nasal spray, Place 1 spray into both nostrils daily as needed for allergies or rhinitis., Disp: , Rfl:    gabapentin  (NEURONTIN ) 300 MG capsule, TAKE TWO CAPSULES IN THE MORNING, TAKE ONE IN THE AFTERNOON AND TAKE TWO AT BEDTIME., Disp: 450 capsule, Rfl: 1   glucose blood (ACCU-CHEK GUIDE) test strip, Use to test blood glucose level twice a day DX: E11.40, Disp: 100 each, Rfl: 12   hydrocortisone cream 1 %, Apply 1 Application topically 2 (two) times daily as needed for itching., Disp: , Rfl:    Insulin  Glargine (BASAGLAR  KWIKPEN) 100 UNIT/ML, Inject 30 Units into the skin in the morning. E11.40, Disp: 15 mL, Rfl: 1   meclizine  (ANTIVERT ) 25 MG tablet, Take 1 tablet (25 mg total) by mouth 3 (three) times daily as needed for dizziness., Disp: 30 tablet, Rfl: 0   MULTIPLE MINERALS PO, Take 1 tablet by mouth daily., Disp: , Rfl:    polyethylene glycol (MIRALAX  / GLYCOLAX ) 17 g packet, Take 17 g by mouth daily., Disp: , Rfl:    triamcinolone  cream (KENALOG ) 0.1 %, Apply 1 Application topically 2 (two) times daily as needed., Disp: 30 g, Rfl: 0   Physical Exam:   BP 116/72 (BP Location: Left Arm, Patient Position: Sitting, Cuff Size: Large)   Pulse 79   Ht 5' 11 (1.803 m)   SpO2 92%   BMI 29.46 kg/m   Salient findings:  CN II-XII intact Given history and complaints, ear microscopy was indicated and performed for evaluation with findings as below in physical exam section and in procedures; Right EAC cerumen impaction, left clear; after clearance, TM intact  with well pneumatized middle ear spaces Anterior rhinoscopy: Septum intact; bilateral inferior turbinates without significant hypertrophy No lesions of oral cavity/oropharynx; able to easily express saliva from both parotid ducts No obviously palpable neck masses/lymphadenopathy/thyromegaly; unable to feel parotid nodule, parotids overall soft; no skin lesions over scalp or postauricular or neck No respiratory distress or stridor  Seprately Identifiable Procedures:  Prior to initiating any procedures, risks/benefits/alternatives were explained to the patient and verbal consent obtained. Procedure: Bilateral ear microscopy and cerumen removal using microscope (CPT [id number]) - Mod 25 Pre-procedure diagnosis: Cerumen impaction right external ears Post-procedure diagnosis: same Indication: Right cerumen impaction; given patient's otologic complaints and history as well as for improved and comprehensive examination of external ear and tympanic membrane, bilateral otologic examination using microscope was performed and impacted cerumen removed  Procedure: Patient was placed semi-recumbent. Both ear canals were examined using the microscope  with findings above. Impacted Cerumen removed on right using currette with improvement in EAC examination and patency. Patient tolerated the procedure well.   Impression & Plans:  Jonathyn Carothers is a 84 y.o. male with:  1. Parotid nodule   2. Sensorineural hearing loss, bilateral   3. Benign paroxysmal positional vertigo, unspecified laterality   4. Impacted cerumen of right ear    We discussed benign and malignant etiologies for this -- most likely benign given stable appearance from almost 2 years ago and now stable on US  --- given stability he opted for observation; d/w pt return precautions;  B/l SNHL and impacted cerumen: cleared, discussed ceruminosis strategies; will observe  See below regarding exact medications prescribed this encounter including  dosages and route: No orders of the defined types were placed in this encounter.     Thank you for allowing me the opportunity to care for your patient. Please do not hesitate to contact me should you have any other questions.  Sincerely, Eldora Blanch, MD Otolaryngologist (ENT), [facility] Phone: [phone] Fax: [phone]  02/21/2024, 1:30 PM   MDM:  [id number] Complexity/Problems addressed: mod - multiple chronic problems Data complexity: mod - independent imaging interpretation - Morbidity: low - Prescription Drug prescribed or managed: n

## 2024-03-14 ENCOUNTER — Encounter: Payer: Self-pay | Admitting: Internal Medicine

## 2024-03-14 ENCOUNTER — Non-Acute Institutional Stay: Payer: Self-pay | Admitting: Internal Medicine

## 2024-03-14 VITALS — BP 118/68 | HR 60 | Temp 98.0°F | Resp 18 | Ht 71.0 in | Wt 212.5 lb

## 2024-03-14 DIAGNOSIS — E782 Mixed hyperlipidemia: Secondary | ICD-10-CM

## 2024-03-14 DIAGNOSIS — G4762 Sleep related leg cramps: Secondary | ICD-10-CM

## 2024-03-14 DIAGNOSIS — F330 Major depressive disorder, recurrent, mild: Secondary | ICD-10-CM

## 2024-03-14 DIAGNOSIS — K118 Other diseases of salivary glands: Secondary | ICD-10-CM

## 2024-03-14 DIAGNOSIS — E1140 Type 2 diabetes mellitus with diabetic neuropathy, unspecified: Secondary | ICD-10-CM

## 2024-03-14 LAB — HEMOGLOBIN A1C
Hgb A1c MFr Bld: 7.2 % — ABNORMAL HIGH
Mean Plasma Glucose: 160 mg/dL
eAG (mmol/L): 8.9 mmol/L

## 2024-03-14 LAB — CBC WITH DIFFERENTIAL/PLATELET
Absolute Lymphocytes: 1142 {cells}/uL (ref 850–3900)
Absolute Monocytes: 545 {cells}/uL (ref 200–950)
Basophils Absolute: 28 {cells}/uL (ref 0–200)
Basophils Relative: 0.6 %
Eosinophils Absolute: 179 {cells}/uL (ref 15–500)
Eosinophils Relative: 3.8 %
HCT: 43.3 % (ref 39.4–51.1)
Hemoglobin: 14.9 g/dL (ref 13.2–17.1)
MCH: 31.6 pg (ref 27.0–33.0)
MCHC: 34.4 g/dL (ref 31.6–35.4)
MCV: 91.9 fL (ref 81.4–101.7)
MPV: 10.2 fL (ref 7.5–12.5)
Monocytes Relative: 11.6 %
Neutro Abs: 2806 {cells}/uL (ref 1500–7800)
Neutrophils Relative %: 59.7 %
Platelets: 227 10*3/uL (ref 140–400)
RBC: 4.71 Million/uL (ref 4.20–5.80)
RDW: 12.3 % (ref 11.0–15.0)
Total Lymphocyte: 24.3 %
WBC: 4.7 10*3/uL (ref 3.8–10.8)

## 2024-03-14 LAB — COMPLETE METABOLIC PANEL WITHOUT GFR
AG Ratio: 1.8 (calc) (ref 1.0–2.5)
ALT: 11 U/L (ref 9–46)
AST: 21 U/L (ref 10–35)
Albumin: 4.2 g/dL (ref 3.6–5.1)
Alkaline phosphatase (APISO): 95 U/L (ref 35–144)
BUN: 25 mg/dL (ref 7–25)
CO2: 30 mmol/L (ref 20–32)
Calcium: 9.2 mg/dL (ref 8.6–10.3)
Chloride: 105 mmol/L (ref 98–110)
Creat: 0.8 mg/dL (ref 0.70–1.22)
Globulin: 2.4 g/dL (ref 1.9–3.7)
Glucose, Bld: 108 mg/dL — ABNORMAL HIGH (ref 65–99)
Potassium: 4.4 mmol/L (ref 3.5–5.3)
Sodium: 141 mmol/L (ref 135–146)
Total Bilirubin: 0.7 mg/dL (ref 0.2–1.2)
Total Protein: 6.6 g/dL (ref 6.1–8.1)

## 2024-03-14 LAB — LIPID PANEL
Cholesterol: 120 mg/dL
HDL: 43 mg/dL
LDL Cholesterol (Calc): 63 mg/dL
Non-HDL Cholesterol (Calc): 77 mg/dL
Total CHOL/HDL Ratio: 2.8 (calc)
Triglycerides: 63 mg/dL

## 2024-03-14 LAB — TSH: TSH: 1.64 m[IU]/L (ref 0.40–4.50)

## 2024-03-14 MED ORDER — BASAGLAR KWIKPEN 100 UNIT/ML ~~LOC~~ SOPN: 32.0000 [IU] | PEN_INJECTOR | Freq: Every morning | SUBCUTANEOUS | Status: AC

## 2024-03-14 NOTE — Patient Instructions (Signed)
 Labs will be done on Tuesday,  May 5th at [facility] at 7:00 am

## 2024-03-14 NOTE — Progress Notes (Unsigned)
 "  Location:      Place of Service:     Provider:   Code Status: *** Goals of Care:     12/21/2023    1:48 PM  Advanced Directives  Does Patient Have a Medical Advance Directive? Yes  Type of Estate Agent of [place];Living will  Does patient want to make changes to medical advance directive? No - Patient declined  Copy of Healthcare Power of Attorney in Chart? No - copy requested     Chief Complaint  Patient presents with   Medical Management of Chronic Issues    Four Months with labs     HPI: Patient is a 84 y.o. male seen today for medical management of chronic diseases.     Past Medical History:  Diagnosis Date   Arthritis    Complication of anesthesia    Difficult to wake up   Depression    Diabetes mellitus without complication (HCC)    Headache    History of kidney stones    Pulmonary embolism (HCC)    After shoulder surgery   Vertigo     Past Surgical History:  Procedure Laterality Date   ADENOIDECTOMY Bilateral    FINGER SURGERY     Left middle   FOOT SURGERY Left    laceration/ tendon repair   forehead surgery     L4-L5 fusion     LITHOTRIPSY     Prostrate     Frozen   REVERSE SHOULDER ARTHROPLASTY Left 06/17/2022   Procedure: REVERSE SHOULDER ARTHROPLASTY;  Surgeon: Melita Drivers, MD;  Location: [facility];  Service: Orthopedics;  Laterality: Left;    SHOULDER SURGERY     Complete replacement to right shoulder   TONSILLECTOMY     TRANSURETHRAL RESECTION OF PROSTATE N/A 09/02/2020   Procedure: TRANSURETHRAL RESECTION OF THE PROSTATE (TURP);  Surgeon: Watt Rush, MD;  Location: [facility];  Service: Urology;  Laterality: N/A;    Allergies[1]  Outpatient Encounter Medications as of 03/14/2024  Medication Sig   Accu-Chek Softclix Lancets lancets Use to check blood glucose level twice a day E11.40   acetaminophen  (TYLENOL ) 500 MG tablet Take 500 mg by mouth 2 (two) times daily.   amoxicillin  (AMOXIL ) 500 MG capsule Take 4  tabs 1 hour before Dental Cleaning   atorvastatin  (LIPITOR) 40 MG tablet Take 1 tablet (40 mg total) by mouth at bedtime. E78.2   carbidopa -levodopa  (SINEMET  CR) 50-200 MG tablet TAKE 1 TABLET BY MOUTH TWICE A DAY   Continuous Glucose Receiver (DEXCOM G7 RECEIVER) DEVI For CBG monitoring   Continuous Glucose Sensor (DEXCOM G7 SENSOR) MISC CHANGE SENSOR EVERY 10 DAYS. DX:E11.40   EMBECTA PEN NEEDLE ULTRAFINE 31G X 8 MM MISC USE AS DIRECTED   escitalopram  (LEXAPRO ) 10 MG tablet Take 1 tablet (10 mg total) by mouth daily.   fluticasone  (FLONASE ) 50 MCG/ACT nasal spray Place 1 spray into both nostrils daily as needed for allergies or rhinitis.   gabapentin  (NEURONTIN ) 300 MG capsule TAKE TWO CAPSULES IN THE MORNING, TAKE ONE IN THE AFTERNOON AND TAKE TWO AT BEDTIME.   glucose blood (ACCU-CHEK GUIDE) test strip Use to test blood glucose level twice a day DX: E11.40   hydrocortisone cream 1 % Apply 1 Application topically 2 (two) times daily as needed for itching.   meclizine  (ANTIVERT ) 25 MG tablet Take 1 tablet (25 mg total) by mouth 3 (three) times daily as needed for dizziness.   MULTIPLE MINERALS PO Take 1 tablet by mouth daily.  polyethylene glycol (MIRALAX  / GLYCOLAX ) 17 g packet Take 17 g by mouth daily.   triamcinolone  cream (KENALOG ) 0.1 % Apply 1 Application topically 2 (two) times daily as needed.   [DISCONTINUED] Insulin  Glargine (BASAGLAR  KWIKPEN) 100 UNIT/ML Inject 30 Units into the skin in the morning. E11.40   Insulin  Glargine (BASAGLAR  KWIKPEN) 100 UNIT/ML Inject 32-34 Units into the skin in the morning. E11.40   No facility-administered encounter medications on file as of 03/14/2024.    Review of Systems:  Review of Systems  Health Maintenance  Topic Date Due   Zoster Vaccines- Shingrix (1 of 2) Never done   DTaP/Tdap/Td (4 - Tdap) 06/10/2011   Diabetic kidney evaluation - Urine ACR  05/29/2021   Medicare Annual Wellness (AWV)  01/05/2023   FOOT EXAM  06/02/2023   COVID-19  Vaccine (9 - 2025-26 season) 04/17/2024   OPHTHALMOLOGY EXAM  04/26/2024   HEMOGLOBIN A1C  09/10/2024   Diabetic kidney evaluation - eGFR measurement  03/13/2025   Pneumococcal Vaccine: 50+ Years  Completed   Influenza Vaccine  Completed   Meningococcal B Vaccine  Aged Out    Physical Exam: Vitals:   03/14/24 0956  BP: 118/68  Pulse: 60  Resp: 18  Temp: 98 F (36.7 C)  SpO2: 93%  Weight: 212 lb 8 oz (96.4 kg)  Height: 5' 11 (1.803 m)   Body mass index is 29.64 kg/m. Physical Exam  Labs reviewed: Basic Metabolic Panel: Recent Labs    05/10/23 0707 08/04/23 0725 11/10/23 0740 03/13/24 0720  NA 141 140 139 141  K 4.1 4.2 4.3 4.4  CL 108 106 105 105  CO2 24 27 27 30   GLUCOSE 131* 133* 128* 108*  BUN 23 18 21 25   CREATININE 0.74 0.78 0.79 0.80  CALCIUM  9.0 9.2 9.4 9.2  TSH 1.56  --   --  1.64   Liver Function Tests: Recent Labs    08/04/23 0725 11/10/23 0740 03/13/24 0720  AST 21 18 21   ALT 12 9 11   BILITOT 0.9 0.8 0.7  PROT 6.7 6.8 6.6   No results for input(s): LIPASE, AMYLASE in the last 8760 hours. No results for input(s): AMMONIA in the last 8760 hours. CBC: Recent Labs    08/04/23 0725 11/10/23 0740 03/13/24 0720  WBC 4.5 4.5 4.7  NEUTROABS 2,768 2,723 2,806  HGB 15.1 15.3 14.9  HCT 46.1 44.8 43.3  MCV 95.1 92.8 91.9  PLT 230 239 227   Lipid Panel: Recent Labs    05/10/23 0707 03/13/24 0720  CHOL 121 120  HDL 40 43  LDLCALC 64 63  TRIG 86 63  CHOLHDL 3.0 2.8   Lab Results  Component Value Date   HGBA1C 7.2 (H) 03/13/2024    Procedures since last visit: No results found.  Assessment/Plan 1. Type 2 diabetes mellitus with diabetic neuropathy, without long-term current use of insulin  (HCC) *** - Insulin  Glargine (BASAGLAR  KWIKPEN) 100 UNIT/ML; Inject 32-34 Units into the skin in the morning. E11.40  2. Mass of right parotid gland (Primary) ***  3. Nocturnal leg cramps ***  4. Mixed hyperlipidemia ***  5. Mild  episode of recurrent major depressive disorder ***    Labs/tests ordered:  * No order type specified * Next appt:  04/26/2024        [1]  Allergies Allergen Reactions   Sulfa Antibiotics Swelling    Joint swelling   "

## 2024-04-26 ENCOUNTER — Encounter: Admitting: Nurse Practitioner

## 2024-04-30 ENCOUNTER — Ambulatory Visit: Admitting: Podiatry

## 2024-06-13 ENCOUNTER — Encounter: Admitting: Internal Medicine
